# Patient Record
Sex: Female | Born: 1960
Health system: Southern US, Community
[De-identification: ages and names within clinical notes are randomized; demographics above are authoritative.]

## PROBLEM LIST (undated history)

## (undated) DIAGNOSIS — M1990 Unspecified osteoarthritis, unspecified site: Secondary | ICD-10-CM

## (undated) DIAGNOSIS — F329 Major depressive disorder, single episode, unspecified: Secondary | ICD-10-CM

## (undated) DIAGNOSIS — Z973 Presence of spectacles and contact lenses: Secondary | ICD-10-CM

## (undated) DIAGNOSIS — F32A Depression, unspecified: Secondary | ICD-10-CM

## (undated) DIAGNOSIS — E785 Hyperlipidemia, unspecified: Secondary | ICD-10-CM

## (undated) DIAGNOSIS — L80 Vitiligo: Secondary | ICD-10-CM

## (undated) DIAGNOSIS — Z720 Tobacco use: Secondary | ICD-10-CM

## (undated) DIAGNOSIS — J449 Chronic obstructive pulmonary disease, unspecified: Secondary | ICD-10-CM

## (undated) DIAGNOSIS — D219 Benign neoplasm of connective and other soft tissue, unspecified: Secondary | ICD-10-CM

## (undated) DIAGNOSIS — F419 Anxiety disorder, unspecified: Secondary | ICD-10-CM

## (undated) HISTORY — DX: Tobacco use: Z72.0

## (undated) HISTORY — DX: Depression, unspecified: F32.A

## (undated) HISTORY — DX: Benign neoplasm of connective and other soft tissue, unspecified: D21.9

## (undated) HISTORY — DX: Unspecified osteoarthritis, unspecified site: M19.90

## (undated) HISTORY — DX: Vitiligo: L80

## (undated) HISTORY — DX: Chronic obstructive pulmonary disease, unspecified: J44.9

## (undated) HISTORY — PX: TUBAL LIGATION: SHX77

## (undated) HISTORY — PX: DILATION AND CURETTAGE OF UTERUS: SHX78

## (undated) HISTORY — DX: Hyperlipidemia, unspecified: E78.5

## (undated) HISTORY — DX: Major depressive disorder, single episode, unspecified: F32.9

---
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
## 1999-06-24 ENCOUNTER — Other Ambulatory Visit: Admission: RE | Admit: 1999-06-24 | Discharge: 1999-06-24 | Payer: Self-pay | Admitting: Family Medicine

## 1999-12-16 ENCOUNTER — Encounter: Payer: Self-pay | Admitting: Family Medicine

## 1999-12-16 ENCOUNTER — Encounter: Admission: RE | Admit: 1999-12-16 | Discharge: 1999-12-16 | Payer: Self-pay | Admitting: Family Medicine

## 2000-01-05 ENCOUNTER — Encounter: Payer: Self-pay | Admitting: Orthopedic Surgery

## 2000-01-05 ENCOUNTER — Ambulatory Visit (HOSPITAL_COMMUNITY): Admission: RE | Admit: 2000-01-05 | Discharge: 2000-01-05 | Payer: Self-pay | Admitting: Orthopedic Surgery

## 2001-03-25 ENCOUNTER — Other Ambulatory Visit (HOSPITAL_COMMUNITY): Admission: RE | Admit: 2001-03-25 | Discharge: 2001-03-29 | Payer: Self-pay | Admitting: Psychiatry

## 2001-10-12 HISTORY — PX: HIP ARTHROSCOPY: SUR88

## 2002-01-02 ENCOUNTER — Other Ambulatory Visit: Admission: RE | Admit: 2002-01-02 | Discharge: 2002-01-02 | Payer: Self-pay | Admitting: Family Medicine

## 2004-10-24 ENCOUNTER — Encounter: Admission: RE | Admit: 2004-10-24 | Discharge: 2004-10-24 | Payer: Self-pay | Admitting: Family Medicine

## 2004-10-24 ENCOUNTER — Ambulatory Visit: Payer: Self-pay | Admitting: Family Medicine

## 2005-01-22 ENCOUNTER — Ambulatory Visit: Payer: Self-pay | Admitting: Family Medicine

## 2009-10-12 HISTORY — PX: URETER SURGERY: SHX823

## 2009-10-12 HISTORY — PX: ABDOMINAL HYSTERECTOMY: SHX81

## 2009-10-12 HISTORY — PX: LAPAROSCOPIC TOTAL HYSTERECTOMY: SUR800

## 2010-02-06 ENCOUNTER — Ambulatory Visit: Payer: Self-pay | Admitting: Family Medicine

## 2010-02-06 ENCOUNTER — Other Ambulatory Visit: Admission: RE | Admit: 2010-02-06 | Discharge: 2010-02-06 | Payer: Self-pay | Admitting: Family Medicine

## 2010-02-06 DIAGNOSIS — M1990 Unspecified osteoarthritis, unspecified site: Secondary | ICD-10-CM | POA: Insufficient documentation

## 2010-02-06 DIAGNOSIS — F17200 Nicotine dependence, unspecified, uncomplicated: Secondary | ICD-10-CM

## 2010-02-06 DIAGNOSIS — F329 Major depressive disorder, single episode, unspecified: Secondary | ICD-10-CM

## 2010-02-06 DIAGNOSIS — F419 Anxiety disorder, unspecified: Secondary | ICD-10-CM

## 2010-02-06 DIAGNOSIS — Z720 Tobacco use: Secondary | ICD-10-CM | POA: Insufficient documentation

## 2010-02-06 DIAGNOSIS — F32A Depression, unspecified: Secondary | ICD-10-CM | POA: Insufficient documentation

## 2010-02-06 DIAGNOSIS — E785 Hyperlipidemia, unspecified: Secondary | ICD-10-CM | POA: Insufficient documentation

## 2010-02-06 LAB — HM PAP SMEAR

## 2010-02-07 LAB — CONVERTED CEMR LAB
ALT: 11 units/L (ref 0–35)
AST: 15 units/L (ref 0–37)
Albumin: 4.1 g/dL (ref 3.5–5.2)
Alkaline Phosphatase: 52 units/L (ref 39–117)
BUN: 8 mg/dL (ref 6–23)
Bilirubin, Direct: 0.1 mg/dL (ref 0.0–0.3)
CO2: 29 meq/L (ref 19–32)
Calcium: 9.3 mg/dL (ref 8.4–10.5)
Chloride: 107 meq/L (ref 96–112)
Cholesterol: 197 mg/dL (ref 0–200)
Creatinine, Ser: 1.2 mg/dL (ref 0.4–1.2)
GFR calc non Af Amer: 50.82 mL/min (ref >60)
Glucose, Bld: 78 mg/dL (ref 70–99)
HDL: 38.5 mg/dL — ABNORMAL LOW (ref >39.00)
LDL Cholesterol: 137 mg/dL — ABNORMAL HIGH (ref 0–99)
Potassium: 4.1 meq/L (ref 3.5–5.1)
Sodium: 141 meq/L (ref 135–145)
Total Bilirubin: 0.3 mg/dL (ref 0.3–1.2)
Total CHOL/HDL Ratio: 5
Total Protein: 6 g/dL (ref 6.0–8.3)
Triglycerides: 110 mg/dL (ref 0.0–149.0)
VLDL: 22 mg/dL (ref 0.0–40.0)

## 2010-02-11 ENCOUNTER — Encounter (INDEPENDENT_AMBULATORY_CARE_PROVIDER_SITE_OTHER): Payer: Self-pay | Admitting: *Deleted

## 2010-02-11 ENCOUNTER — Encounter: Payer: Self-pay | Admitting: Family Medicine

## 2010-02-11 LAB — CONVERTED CEMR LAB: Pap Smear: NEGATIVE

## 2010-02-13 ENCOUNTER — Encounter: Payer: Self-pay | Admitting: Family Medicine

## 2010-02-14 ENCOUNTER — Encounter: Payer: Self-pay | Admitting: Family Medicine

## 2010-02-17 DIAGNOSIS — R928 Other abnormal and inconclusive findings on diagnostic imaging of breast: Secondary | ICD-10-CM | POA: Insufficient documentation

## 2010-03-24 ENCOUNTER — Emergency Department (HOSPITAL_COMMUNITY): Admission: EM | Admit: 2010-03-24 | Discharge: 2010-03-25 | Payer: Self-pay | Admitting: Emergency Medicine

## 2010-03-27 ENCOUNTER — Ambulatory Visit: Payer: Self-pay | Admitting: Family Medicine

## 2010-03-27 DIAGNOSIS — R109 Unspecified abdominal pain: Secondary | ICD-10-CM | POA: Insufficient documentation

## 2010-03-28 ENCOUNTER — Encounter: Payer: Self-pay | Admitting: Family Medicine

## 2010-03-28 ENCOUNTER — Telehealth: Payer: Self-pay | Admitting: Family Medicine

## 2010-03-28 ENCOUNTER — Ambulatory Visit: Payer: Self-pay | Admitting: Family Medicine

## 2010-04-03 ENCOUNTER — Ambulatory Visit: Payer: Self-pay | Admitting: Family Medicine

## 2010-04-03 ENCOUNTER — Encounter: Payer: Self-pay | Admitting: Family Medicine

## 2010-04-16 ENCOUNTER — Telehealth: Payer: Self-pay | Admitting: Family Medicine

## 2010-05-26 ENCOUNTER — Encounter (INDEPENDENT_AMBULATORY_CARE_PROVIDER_SITE_OTHER): Payer: Self-pay | Admitting: *Deleted

## 2010-05-26 ENCOUNTER — Encounter: Payer: Self-pay | Admitting: Family Medicine

## 2010-05-26 ENCOUNTER — Telehealth: Payer: Self-pay | Admitting: Family Medicine

## 2010-05-26 DIAGNOSIS — D259 Leiomyoma of uterus, unspecified: Secondary | ICD-10-CM | POA: Insufficient documentation

## 2010-05-28 ENCOUNTER — Ambulatory Visit: Payer: Self-pay | Admitting: Obstetrics and Gynecology

## 2010-06-12 ENCOUNTER — Ambulatory Visit (HOSPITAL_COMMUNITY): Admission: RE | Admit: 2010-06-12 | Discharge: 2010-06-12 | Payer: Self-pay | Admitting: Obstetrics and Gynecology

## 2010-06-17 ENCOUNTER — Ambulatory Visit: Payer: Self-pay | Admitting: Family Medicine

## 2010-07-11 ENCOUNTER — Ambulatory Visit: Payer: Self-pay | Admitting: Internal Medicine

## 2010-07-11 DIAGNOSIS — R932 Abnormal findings on diagnostic imaging of liver and biliary tract: Secondary | ICD-10-CM | POA: Insufficient documentation

## 2010-07-22 ENCOUNTER — Encounter: Payer: Self-pay | Admitting: Family Medicine

## 2010-07-24 ENCOUNTER — Ambulatory Visit: Payer: Self-pay | Admitting: Family Medicine

## 2010-07-24 ENCOUNTER — Encounter: Payer: Self-pay | Admitting: Urology

## 2010-07-24 ENCOUNTER — Ambulatory Visit (HOSPITAL_COMMUNITY): Admission: RE | Admit: 2010-07-24 | Discharge: 2010-07-26 | Payer: Self-pay | Admitting: Family Medicine

## 2010-07-28 ENCOUNTER — Ambulatory Visit (HOSPITAL_COMMUNITY): Admission: RE | Admit: 2010-07-28 | Discharge: 2010-07-28 | Payer: Self-pay | Admitting: Urology

## 2010-08-18 ENCOUNTER — Encounter: Payer: Self-pay | Admitting: Family Medicine

## 2010-08-19 ENCOUNTER — Ambulatory Visit: Payer: Self-pay | Admitting: Family Medicine

## 2010-09-01 ENCOUNTER — Ambulatory Visit (HOSPITAL_COMMUNITY)
Admission: RE | Admit: 2010-09-01 | Discharge: 2010-09-01 | Payer: Self-pay | Source: Home / Self Care | Admitting: Urology

## 2010-09-02 ENCOUNTER — Ambulatory Visit: Payer: Self-pay | Admitting: Family Medicine

## 2010-09-11 ENCOUNTER — Inpatient Hospital Stay (HOSPITAL_COMMUNITY)
Admission: RE | Admit: 2010-09-11 | Discharge: 2010-09-15 | Payer: Self-pay | Source: Home / Self Care | Admitting: Urology

## 2010-09-11 ENCOUNTER — Encounter (INDEPENDENT_AMBULATORY_CARE_PROVIDER_SITE_OTHER): Payer: Self-pay | Admitting: Urology

## 2010-09-17 ENCOUNTER — Ambulatory Visit: Payer: Self-pay | Admitting: Family Medicine

## 2010-10-09 ENCOUNTER — Telehealth: Payer: Self-pay | Admitting: Family Medicine

## 2010-10-14 ENCOUNTER — Ambulatory Visit
Admission: RE | Admit: 2010-10-14 | Discharge: 2010-10-14 | Payer: Self-pay | Source: Home / Self Care | Attending: Family Medicine | Admitting: Family Medicine

## 2010-10-16 ENCOUNTER — Ambulatory Visit
Admission: RE | Admit: 2010-10-16 | Discharge: 2010-10-16 | Payer: Self-pay | Source: Home / Self Care | Attending: Urology | Admitting: Urology

## 2010-10-16 LAB — POCT HEMOGLOBIN-HEMACUE: Hemoglobin: 13.1 g/dL (ref 12.0–15.0)

## 2010-10-24 ENCOUNTER — Encounter: Payer: Self-pay | Admitting: Family Medicine

## 2010-11-05 ENCOUNTER — Ambulatory Visit
Admission: RE | Admit: 2010-11-05 | Discharge: 2010-11-05 | Payer: Self-pay | Source: Home / Self Care | Attending: Family Medicine | Admitting: Family Medicine

## 2010-11-05 ENCOUNTER — Encounter: Payer: Self-pay | Admitting: Family Medicine

## 2010-11-06 NOTE — Assessment & Plan Note (Signed)
NAME:  [name] [name]              ACCOUNT NO.:  [id number]  MEDICAL RECORD NO.:  [id number]          PATIENT TYPE:  POB  LOCATION:  [facility] at [facility]         FACILITY:  [facility]  PHYSICIAN:  Tinnie Gens, MD        DATE OF BIRTH:  1961-09-26  DATE OF SERVICE:                                 CLINIC NOTE  CHIEF COMPLAINT:  Followup.  HISTORY OF PRESENT ILLNESS:  The patient is a 50 year old patient who underwent TLH with subsequent ureteral injury.  She has undergone ureteral repair and reimplantation.  Last time she was here she still had a double-J stent and this has since been removed as has her percutaneous nephrostomy tube.  Since removal, she has had increasing pain in her back.  She is using a Lidoderm patch, Ultram and Percocet if needed. She reports she has been off of that for approximately 2 days and that her pain has gotten better.  She has also been back to work for a couple hours and has done okay there and she is going to gradually increase her hours.  She is to follow up to Urology today.  We did discuss hormone replacement therapy.  She continues to have hot flashes and probably is in need of a higher dose of hormone replacement.  We discussed how often to change the patch and how much would be in it.  On exam today, vitals are as noted in the chart.  She is well-developed, well-nourished female in no acute distress.  Her abdomen is soft, nontender, nondistended.  Incisions is well healed.  IMPRESSION: 1. Postop visit. 2. Not quite enough hormone replacement therapy.  PLAN:  To increase her Vivelle-Dot to 0.075 mg per day.  She will change the dot twice weekly.  She will follow with Urology and return to see me in 4-6 weeks for final postop check.          ______________________________ Tinnie Gens, MD    TP/MEDQ  D:  11/05/2010  T:  11/06/2010  Job:  [id number]

## 2010-11-11 NOTE — Progress Notes (Signed)
Summary: Rx Tramadol  Phone Note Call from Patient   Caller: Patient Summary of Call: Called patient to give referral appts for GYN and GI. She asked me to ask Dr Dayton Martes for some pain medicine . She says her pain is about a 7 out of 10, she says she is not good with pain. It will just hit her randomly and never the same location. she uses [facility].  Her appt is 05/28/2010 with GYN - Dr Jolayne Panther and GI is 07/11/2010 with Dr Leone Payor.  Please call the patient with what you can call in. Codeine makes her nauceous.  Initial call taken by: Carlton Adam,  May 26, 2010 4:43 PM  Follow-up for Phone Call        Left message on machine at home for patient to return call.  Linde Gillis CMA Duncan Dull)  May 27, 2010 10:04 AM   Patient advised as instructed via telephone. Follow-up by: Linde Gillis CMA Duncan Dull),  May 27, 2010 1:19 PM    New/Updated Medications: TRAMADOL HCL 50 MG  TABS (TRAMADOL HCL) 1-2 tab by mouth two times a day as needed pain Prescriptions: TRAMADOL HCL 50 MG  TABS (TRAMADOL HCL) 1-2 tab by mouth two times a day as needed pain  #60 x 0   Entered and Authorized by:   Ruthe Mannan MD   Signed by:   Ruthe Mannan MD on 05/27/2010   Method used:   [facility]  [address] (retail)       [address]       [place], [place]  [address]       Ph: [phone] or [phone]       Fax: [phone]   RxID:   [id number]

## 2010-11-11 NOTE — Miscellaneous (Signed)
Summary: Orders Update  Clinical Lists Changes  Problems: Added new problem of LEIOMYOMA, UTERUS (ICD-218.9) Orders: Added new Referral order of Gynecologic Referral (Gyn) - Signed Added new Referral order of Gastroenterology Referral (GI) - Signed

## 2010-11-11 NOTE — Letter (Signed)
Summary: Results Follow up Letter  [place] at [facility]  [address] [place], [place] [address]   Phone: [phone]  Fax: [phone]    02/11/2010 MRN: [id number]    [name] [name] [address] [place], [place]  [address]    Dear Ms. Salzwedel,  The following are the results of your recent test(s):  Test         Result    Pap Smear:        Normal __X___  Not Normal _____ Comments:  Yearly follow up is recommended. ______________________________________________________ Cholesterol: LDL(Bad cholesterol):         Your goal is less than:         HDL (Good cholesterol):       Your goal is more than: Comments:  ______________________________________________________ Mammogram:        Normal _____  Not Normal _____ Comments:  ___________________________________________________________________ Hemoccult:        Normal _____  Not normal _______ Comments:    _____________________________________________________________________ Other Tests:    We routinely do not discuss normal results over the telephone.  If you desire a copy of the results, or you have any questions about this information we can discuss them at your next office visit.   Sincerely,    Ruthe Mannan,  M.D.  TA:lsf

## 2010-11-11 NOTE — Progress Notes (Signed)
Summary: abd ultrasound negative  Phone Note From Other Clinic   Caller: [facility] [place] Summary of Call: Called report regarding pt's abd ultrasound, this was negative. Initial call taken by: Lowella Petties CMA,  March 28, 2010 8:59 AM  Follow-up for Phone Call        thank you, please advise pt it was neg. Ruthe Mannan MD  March 28, 2010 9:05 AM   Additional Follow-up for Phone Call Additional follow up Details #1::        Left message for patient to call back. Lewanda Rife LPN  March 28, 2010 10:19 AM  When she calls back, please let her know that I would like to order a CT of her abdomen to evaluate further.  If that is ok with her, I will send order to [facility]. Ruthe Mannan MD  March 28, 2010 10:48 AM     Additional Follow-up for Phone Call Additional follow up Details #2::    Patient notified as instructed by telephone. Pt is agreeable to have CT of abdomen. Pt would like to go to [facility] and her work schedule is very busy. Pt could go 03/31/10 after 1PM and pt could go 04/03/10 anytime that day. Pt can be reached on cell at [phone]. Pt will wait to hear from [facility].Lewanda Rife LPN  March 28, 2010 11:00 AM

## 2010-11-11 NOTE — Letter (Signed)
Summary: New Patient letter  [facility]  [address] [place], [place] [address]   Phone: [phone]  Fax: [phone]       05/26/2010 MRN: [id number]  [name] [name] [address] [place], [place]  [address]  Dear [name] [name],  Welcome to the Gastroenterology Division at [facility].    You are scheduled to see Dr.  Leone Payor on 07-11-10 at 10:00a.m. on the 3rd floor at [facility], [address]  We ask that you try to arrive at our office 15 minutes prior to your appointment time to allow for check-in.  We would like you to complete the enclosed self-administered evaluation form prior to your visit and bring it with you on the day of your appointment.  We will review it with you.  Also, please bring a complete list of all your medications or, if you prefer, bring the medication bottles and we will list them.  Please bring your insurance card so that we may make a copy of it.  If your insurance requires a referral to see a specialist, please bring your referral form from your primary care physician.  Co-payments are due at the time of your visit and may be paid by cash, check or credit card.     Your office visit will consist of a consult with your physician (includes a physical exam), any laboratory testing he/she may order, scheduling of any necessary diagnostic testing (e.g. x-ray, ultrasound, CT-scan), and scheduling of a procedure (e.g. Endoscopy, Colonoscopy) if required.  Please allow enough time on your schedule to allow for any/all of these possibilities.    If you cannot keep your appointment, please call [phone] to cancel or reschedule prior to your appointment date.  This allows [place] the opportunity to schedule an appointment for another patient in need of care.  If you do not cancel or reschedule by 5 p.m. the business day prior to your appointment date, you will be charged a $50.00 late cancellation/no-show fee.    Thank you for  choosing [facility] for your medical needs.  We appreciate the opportunity to care for you.  Please visit [place] at our website  to learn more about our practice.                     Sincerely,                                                             The Gastroenterology Division

## 2010-11-11 NOTE — Assessment & Plan Note (Signed)
Summary: NEW PT TO ESTABH/DLO   Vital Signs:  Patient profile:   50 year old female Height:      59 inches Weight:      116 pounds BMI:     23.51 Temp:     98.8 degrees F oral Pulse rate:   96 / minute Pulse rhythm:   regular BP sitting:   106 / 74  (left arm) Cuff size:   regular  Vitals Entered By: Delilah Shan CMA Duncan Dull) (February 06, 2010 2:04 PM) CC: New patient to Establish   History of Present Illness: 50 yo female here to establish care.  Depression- ongoing issue for years.  No energy, tearfulness, poor concentration.  No changes in appetite or difficulty sleeping.  No SI or HI.  Was on Wellbutrin for 6 months in past and it helped tremendously.  Tobacco abuse- 1 1/2 ppd x 38 years.  Quit one time, while on Wellbutrin for 6 months.  As soon as she ran out of Wellbutrin, started smoking again.  H/o HLD- previously on statin, has not had it checked in years.  Well woman- has not had a pap smear or mammogram in 8 years.  Never had an abnormal pap or mammogram that she is aware of.  Sexually active with husband only.  LMP 01/16/2010.  Current Medications (verified): 1)  Zyban 150 Mg Xr12h-Tab (Bupropion Hcl (Smoking Deter)) .Marland KitchenMarland KitchenMarland Kitchen 150 Mg Once Daily For 3 Days; Increase To 150 Mg Twice Daily  Allergies (verified): 1)  ! Codeine  Past History:  Family History: Last updated: 02/06/2010 She does not know  Past Medical History: Depression Hyperlipidemia Tobacco abuse Osteoarthritis  Past Surgical History: Labrum repair   Family History: She does not know  Social History: LIves with husband in [place]. Smokes 1 1/2 ppd x 38 years. 3 children. 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  Review of Systems      See HPI General:  Denies chills, fatigue, fever, and loss of appetite. Eyes:  Denies blurring. ENT:  Denies difficulty swallowing. CV:  Denies chest pain or discomfort, difficulty breathing at night, difficulty breathing while  lying down, fainting, fatigue, and leg cramps with exertion. Resp:  Denies shortness of breath. GI:  Denies abdominal pain, bloody stools, and change in bowel habits. GU:  Denies discharge, dysuria, and incontinence. MS:  Denies joint pain, joint redness, and joint swelling. Derm:  Denies rash. Neuro:  Denies visual disturbances and weakness. Psych:  Complains of anxiety, depression, and easily tearful; denies panic attacks, sense of great danger, suicidal thoughts/plans, thoughts of violence, unusual visions or sounds, and thoughts /plans of harming others. Endo:  Denies cold intolerance and heat intolerance. Heme:  Denies abnormal bruising and bleeding.  Physical Exam  General:  alert, well-developed, and well-nourished, anxious, appears older than stated age, NAD.   Eyes:  vision grossly intact, pupils equal, pupils round, and pupils reactive to light.   Ears:  R ear normal and L ear normal.   Nose:  no external deformity.   Mouth:  Oral mucosa and oropharynx without lesions or exudates.  Teeth in good repair. Breasts:  No mass, nodules, thickening, tenderness, bulging, retraction, inflamation, nipple discharge or skin changes noted.   Lungs:  Normal respiratory effort, chest expands symmetrically. Lungs are clear to auscultation, no crackles or wheezes. Heart:  normal rate, regular rhythm, and no murmur.   Abdomen:  Bowel sounds positive,abdomen soft and non-tender without masses, organomegaly or hernias noted. Genitalia:  Pelvic Exam:  External: normal female genitalia without lesions or masses        Vagina: normal without lesions or masses        Cervix: normal without lesions or masses        Adnexa: normal bimanual exam without masses or fullness        Uterus: normal by palpation        Pap smear: performed Extremities:  no edema Neurologic:  alert & oriented X3.   Psych:  Oriented X3, memory intact for recent and remote, normally interactive, and good eye contact,  anxious appearing.     Impression & Recommendations:  Problem # 1:  PREVENTIVE HEALTH CARE (ICD-V70.0) Reviewed preventive care protocols, scheduled due services, and updated immunizations Discussed nutrition, exercise, diet, and healthy lifestyle.  FLP, BMET today. Pap today, tetanus today. Set up mammogram. Orders: Venipuncture (40981) TLB-BMP (Basic Metabolic Panel-BMET) (80048-METABOL)  Problem # 2:  HYPERLIPIDEMIA (ICD-272.4) Assessment: Unchanged recheck FLP and hepatic panel before starting meds.  Problem # 3:  DEPRESSION (ICD-311) Assessment: Deteriorated spent more than 35 minutes face to face discussing depression and tobacco abuse. Will try Wellubutrin/Zyban as it helped with both her depression and tobacco abuse in past. Follow up in one month.  Problem # 4:  TOBACCO ABUSE (ICD-305.1) see above. Her updated medication list for this problem includes:    Zyban 150 Mg Xr12h-tab (Bupropion hcl (smoking deter)) .Marland KitchenMarland KitchenMarland KitchenMarland Kitchen 150 mg once daily for 3 days; increase to 150 mg twice daily  Complete Medication List: 1)  Zyban 150 Mg Xr12h-tab (Bupropion hcl (smoking deter)) .Marland KitchenMarland KitchenMarland Kitchen 150 mg once daily for 3 days; increase to 150 mg twice daily  Other Orders: TLB-Lipid Panel (80061-LIPID) TLB-Hepatic/Liver Function Pnl (80076-HEPATIC) Pap Smear, Thin Prep ( Collection of) (X9147) Radiology Referral (Radiology)  Patient Instructions: 1)  Please stop by to see Aram Beecham on the way out. Prescriptions: ZYBAN 150 MG XR12H-TAB (BUPROPION HCL (SMOKING DETER)) 150 mg once daily for 3 days; increase to 150 mg twice daily  #90 x 3   Entered and Authorized by:   Ruthe Mannan MD   Signed by:   Ruthe Mannan MD on 02/06/2010   Method used:   Print then Give to Patient   RxID:   [id number]   Prior Medications (reviewed today): None Current Allergies (reviewed today): ! CODEINE   Past Medical History:    Depression    Hyperlipidemia    Tobacco abuse    Osteoarthritis  Past Surgical  History:    Labrum repair    TD Result Date:  02/06/2010 TD Result:  given TD Next Due:  10 yr   Appended Document: NEW PT TO ESTABH/DLO   Immunizations Administered:  Tetanus Vaccine:    Vaccine Type: Tdap    Site: left deltoid    Mfr: GlaxoSmithKline    Dose: 0.5 ml    Route: IM    Given by: Delilah Shan CMA (AAMA)    Exp. Date: 01/04/2012    Lot #: [id number]    VIS given: 08/30/07 version given February 06, 2010.

## 2010-11-11 NOTE — Assessment & Plan Note (Signed)
Summary: abd pain--ch.   History of Present Illness Visit Type: Initial Consult Primary GI MD: Stan Head MD [facility] Primary Provider: Ruthe Mannan, MD Requesting Provider: Ruthe Mannan, MD Chief Complaint: abdominal pain  History of Present Illness:   50 yo ww had severe abdominal pain (cannot remember where) and went to ED. "I did not think I was going through the night". She had been having abdominal pain since March or so. pain is not consistently located in one area.   She is scheduled to have a hysterectomy and oopherctomy in about two weeks due to fibroids.  She had an [place] and then CT of abdomen due to fullness in pancreas and there is an 8 mm CBD. Gallbladder ok.  She drank regularly and heavily for 20+ years. She stopped about 10 years ago.   she had not been seeing physicians for about 8 yearsm, starting 2011 after husband obtained health insurance through a job.   ED 6/13 - reviewed   GI Review of Systems    Reports abdominal pain and  nausea.     Location of  Abdominal pain: generalized.    Denies acid reflux, belching, bloating, chest pain, dysphagia with liquids, dysphagia with solids, heartburn, loss of appetite, vomiting, vomiting blood, weight loss, and  weight gain.        Denies anal fissure, black tarry stools, change in bowel habit, constipation, diarrhea, diverticulosis, fecal incontinence, heme positive stool, hemorrhoids, irritable bowel syndrome, jaundice, light color stool, liver problems, rectal bleeding, and  rectal pain. Preventive Screening-Counseling & Management  Alcohol-Tobacco     Alcohol drinks/day: 0     Alcohol Counseling: not indicated; patient does not drink     Smoking Status: current     Smoking Cessation Counseling: yes     Smoke Cessation Stage: contemplative     Passive Smoke Exposure: yes     Tobacco Counseling: not indicated; no tobacco use     Passive Smoke Counseling: to avoid passive smoke exposure  Caffeine-Diet-Exercise  Caffeine use/day: 6-8     Caffeine Counseling: decrease use of caffeine     Does Patient Exercise: no     Exercise Counseling: to improve exercise regimen      Drug Use:  yes.      [place] of Abdomen  Procedure date:  03/28/2010  Findings:      Mild fullness of body of pancreas Otherwise normal  [facility]  CT Abdomen/Pelvis  Procedure date:  04/03/2010  Findings:      1) Leiomyomatous uterus with soft tissue prominence left adnexa 2) Fullness in pancreas, CBD 'distended' in p[ancreas 3) probable renal cysts  [facility]   Current Medications (verified): 1)  Zyban 150 Mg Xr12h-Tab (Bupropion Hcl (Smoking Deter)) .Marland KitchenMarland KitchenMarland Kitchen 150 Mg Once Daily For 3 Days; Increase To 150 Mg Twice Daily 2)  Hydrocodone-Acetaminophen 5-500 Mg Tabs (Hydrocodone-Acetaminophen) .... Take 1-2 Tabs By Mouth Every 4-6 Hours As Needed Pain 3)  Lorazepam 0.5 Mg Tabs (Lorazepam) .Marland Kitchen.. 1 Tab By Mouth Three Times A Day As Needed Anxiety 4)  Tramadol Hcl 50 Mg  Tabs (Tramadol Hcl) .Marland Kitchen.. 1-2 Tab By Mouth Two Times A Day As Needed Pain  Allergies (verified): 1)  ! Codeine  Past History:  Past Medical History: Depression Hyperlipidemia Tobacco abuse Osteoarthritis Anxiety Disorder COPD Uterine fibroids Vitilligo since age 18  Past Surgical History: Labrum repair hip  Family History: Reviewed history from 02/06/2010 and no changes required. She does not know  Social History: Reviewed history from 02/06/2010 and  no changes required. LIves with husband in [place]. Smokes 1 1/2 ppd x 38 years. 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          All other ROS negative except as per HPI.   Vital Signs:  Patient profile:   50 year old female Height:      59 inches Weight:      111 pounds BMI:     22.50 Pulse rate:   80 / minute Pulse rhythm:   regular BP sitting:   112 / 66  (left arm)  Vitals Entered By: Milford Cage NCMA (July 11, 2010 9:46 AM)  Physical Exam  General:  alert, well-developed, and well-nourished, anxious, appears older than stated age, NAD.   Eyes:  PERRLA, no icterus. Mouth:  Oral mucosa and oropharynx without lesions or exudates. partial denture, other teeth in good repair Lungs:  Clear throughout to auscultation. Heart:  Regular rate and rhythm; no murmurs, rubs,  or bruits. Abdomen:  Soft and nondistended. No masses, hepatosplenomegaly or hernias noted. Normal bowel sounds. tender in Bilateral LQ's pelvic area  Extremities:  no edema Cervical Nodes:  No significant cervical or supraclavicular adenopathy.    Impression & Recommendations:  Problem # 1:  NONSPECIFIC ABN FINDNG RAD&OTH EXAM BILARY TRCT (ICD-793.3) Assessment New Fullness in pancreas and distended CBD on CT with NL CBD on earlier [place] nothing in hx points to problems in pancreas now, she did drink heavily in past and maybe that is the cause of these subtle changes She needs to have her hysterectomy and I do not think there is a need to follow- this up now. Will place an ofice visit recall for Decmber 2011 and we can consider repeat imaging then but I am not certain it is needed.  Problem # 2:  ABDOMINAL PAIN OTHER SPECIFIED SITE (ICD-789.09) Assessment: Unchanged I believe pain is from leiomyomata of uterus  Problem # 3:  LEIOMYOMA, UTERUS (ICD-218.9) Assessment: Unchanged I think this is her main problem at this time. She should proceed with hysterectonmy as planned and would not do GI work-up at this time.  Problem # 4:  SCREENING, COLON CANCER (ICD-V76.51) Assessment: Comment Only due for a colonoscopy at age 46,  06/2011  Patient Instructions: 1)  You will be due for a COLONOSCOPY when you are 50--06/2011. 2)  Please schedule a follow-up appointment in 2 months.  3)  Copy sent to : Ruthe Mannan, MD; Tinnie Gens, MD 4)  The medication list was reviewed and reconciled.  All changed / newly prescribed medications were explained.  A complete medication list was provided to the patient / caregiver.

## 2010-11-11 NOTE — Assessment & Plan Note (Signed)
Summary: 30 MIN F/U [place] ON 03/24/10/[facility]   Vital Signs:  Patient profile:   50 year old female Height:      59 inches Weight:      117.38 pounds BMI:     23.79 Temp:     98.7 degrees F oral Pulse rate:   84 / minute Pulse rhythm:   regular BP sitting:   98 / 68  (left arm) Cuff size:   regular  Vitals Entered By: Linde Gillis CMA Duncan Dull) (March 27, 2010 3:42 PM) CC: 30 minute exam, [place] follow up   History of Present Illness: 50 yo here for ER follow up.  Seen on 6/14 for diffuse abdominal pain, often concentrated to RUQ x 2 weeks. Sometimes worsened by food and associated with nausea, no vomiting.  ER records reviewed. UA, CBC, CMET , lipase, upreg all neg. Given pepsid 20 mg daily, Zofran 8 mg two times a day as needed, and 12 percocet.  Says she is not feeling any better. No CP, SOB, LE or other symptoms.  Current Medications (verified): 1)  Zyban 150 Mg Xr12h-Tab (Bupropion Hcl (Smoking Deter)) .Marland KitchenMarland KitchenMarland Kitchen 150 Mg Once Daily For 3 Days; Increase To 150 Mg Twice Daily 2)  Famotidine 20 Mg Tabs (Famotidine) .... Take One Tablet By Mouth Daily 3)  Ondansetron Hcl 4 Mg Tabs (Ondansetron Hcl) .... Take One Tablet By Mouth Two Times A Day 4)  Hydrocodone-Acetaminophen 5-325 Mg Tabs (Hydrocodone-Acetaminophen) .... Take One To Two Tablet By Mouth Every Four To Six Hours As Needed For Pain  Allergies: 1)  ! Codeine  Past History:  Past Medical History: Last updated: 02/06/2010 Depression Hyperlipidemia Tobacco abuse Osteoarthritis  Past Surgical History: Last updated: 02/06/2010 Labrum repair   Family History: Last updated: 02/06/2010 She does not know  Social History: Last updated: 02/06/2010 LIves with husband in [place]. Smokes 1 1/2 ppd x 38 years. 3 children. 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  Review of Systems      See HPI General:  Denies fever. Resp:  Denies cough. GI:  Complains of abdominal pain and nausea;  denies change in bowel habits and vomiting.  Physical Exam  General:  alert, well-developed, and well-nourished, anxious, appears older than stated age, NAD.   Abdomen:  Bowel sounds positive,abdomen soft, diffusely tender, no rebound, no guarding. neg murphy's Psych:  Oriented X3, memory intact for recent and remote, normally interactive, and good eye contact, anxious appearing.     Impression & Recommendations:  Problem # 1:  ABDOMINAL PAIN OTHER SPECIFIED SITE (ICD-789.09) Assessment New Etiology unknown, will get abdominal ultrasound to rule out gall bladder pathology. Of note, pt asked for more pain medication.  I did not give her any as abdominal pain without a source is not helped by narcotics and in fact may further worsen the process, ie constipation.  Pt not pleased but left without much difficulty. Given samples of Nexium. Her updated medication list for this problem includes:    Hydrocodone-acetaminophen 5-325 Mg Tabs (Hydrocodone-acetaminophen) .Marland Kitchen... Take one to two tablet by mouth every four to six hours as needed for pain  Orders: Radiology Referral (Radiology)  Complete Medication List: 1)  Zyban 150 Mg Xr12h-tab (Bupropion hcl (smoking deter)) .Marland KitchenMarland KitchenMarland Kitchen 150 mg once daily for 3 days; increase to 150 mg twice daily 2)  Famotidine 20 Mg Tabs (Famotidine) .... Take one tablet by mouth daily 3)  Ondansetron Hcl 4 Mg Tabs (Ondansetron hcl) .... Take one tablet by mouth two times  a day 4)  Hydrocodone-acetaminophen 5-325 Mg Tabs (Hydrocodone-acetaminophen) .... Take one to two tablet by mouth every four to six hours as needed for pain  Patient Instructions: 1)  Please stop  by to see Shirlee Limerick on your way out to set up your ultrasound.  Current Allergies (reviewed today): ! CODEINE

## 2010-11-11 NOTE — Progress Notes (Signed)
Summary: Wants test results  Phone Note Call from Patient Call back at Home Phone [phone]   Caller: Patient Call For: Ruthe Mannan MD Summary of Call: Patient returned your call and requested that you call her at her home number Thursday after 1:00 Pm, because she will be at work in the morning. Patient is aware that Dr. Dayton Martes is out this afternoon. Initial call taken by: Sydell Axon LPN,  April 17, 5783 1:55 PM  Follow-up for Phone Call        Returned her call at home.  Discussed results.  See CT scan addendum. Ruthe Mannan MD  April 18, 2010 1:44 PM

## 2010-11-13 NOTE — Progress Notes (Signed)
Summary: refill request for zyban  Phone Note Refill Request Message from:  Fax from Pharmacy  Refills Requested: Medication #1:  ZYBAN 150 MG XR12H-TAB 150 mg once daily for 3 days; increase to 150 mg twice daily   Last Refilled: 09/08/2010 [facility] [address]  Initial call taken by: Lowella Petties CMA, AAMA,  October 09, 2010 10:06 AM    Prescriptions: ZYBAN 150 MG XR12H-TAB (BUPROPION HCL (SMOKING DETER)) 150 mg once daily for 3 days; increase to 150 mg twice daily  #90 x 3   Entered and Authorized by:   Ruthe Mannan MD   Signed by:   Ruthe Mannan MD on 10/09/2010   Method used:   [facility]        [facility]  [address] (retail[facility]       [address]       [place], [place]  [address]       Ph: [phone] or [phone]       Fax: [phone]   RxID:   [id number]

## 2010-11-13 NOTE — Letter (Signed)
Summary: [facility]   Imported By: Maryln Gottron 11/03/2010 11:08:45  _____________________________________________________________________  External Attachment:    Type:   Image     Comment:   External Document

## 2010-11-19 NOTE — Letter (Signed)
Summary: [facility]  [facility]   Imported By: Kassie Mends 11/14/2010 11:22:36  _____________________________________________________________________  External Attachment:    Type:   Image     Comment:   External Document

## 2010-12-08 NOTE — Op Note (Signed)
  NAME:  Billups, Emryn              ACCOUNT NO.:  [id number]  MEDICAL RECORD NO.:  [id number]          PATIENT TYPE:  POB  LOCATION:  [facility]                          FACILITY:  [facility]  PHYSICIAN:  Genese Quebedeaux I. Gerik Coberly, M.D.DATE OF BIRTH:  [birth year]  DATE OF PROCEDURE:  10/16/2010 DATE OF DISCHARGE:  10/14/2010                              OPERATIVE REPORT   ADDENDUM:  With the patient in the dorsal lithotomy position, red Robinson catheter was passed, and cystogram was accomplished, which showed no evidence of extravasation, and no reflux.  The bladder walls were smooth.  The red Robinson catheter was removed, and cystourethroscopy was accomplished, which showed normal right hemitrigone, and the left-sided ureteral reimplantation, nicely fixed on the left lateral wall.  The double-J catheter was grasped with a biopsy forceps, and removed easily.  There was no stone material buildup on the double-J catheter.  The ureteral reimplantation was thought to be well-healed.  The bladder was then drained of fluid, the patient given IV Toradol, and awakened and taken recovery room in good condition.     Tristin Gladman I. Patsi Sears, M.D.     SIT/MEDQ  D:  11/04/2010  T:  11/04/2010  Job:  [id number]  Electronically Signed by Jethro Bolus M.D. on 12/08/2010 09:32:31 AM

## 2010-12-10 ENCOUNTER — Ambulatory Visit: Payer: Commercial Indemnity | Admitting: Obstetrics & Gynecology

## 2010-12-10 DIAGNOSIS — Z09 Encounter for follow-up examination after completed treatment for conditions other than malignant neoplasm: Secondary | ICD-10-CM

## 2010-12-23 LAB — BASIC METABOLIC PANEL
BUN: 6 mg/dL (ref 6–23)
BUN: 6 mg/dL (ref 6–23)
BUN: 7 mg/dL (ref 6–23)
BUN: 8 mg/dL (ref 6–23)
BUN: 9 mg/dL (ref 6–23)
BUN: 8 mg/dL (ref 6–23)
CO2: 26 mEq/L (ref 19–32)
CO2: 27 mEq/L (ref 19–32)
CO2: 27 mEq/L (ref 19–32)
CO2: 28 mEq/L (ref 19–32)
CO2: 31 mEq/L (ref 19–32)
Calcium: 8.5 mg/dL (ref 8.4–10.5)
Calcium: 8.7 mg/dL (ref 8.4–10.5)
Calcium: 8.8 mg/dL (ref 8.4–10.5)
Calcium: 8.8 mg/dL (ref 8.4–10.5)
Calcium: 9.1 mg/dL (ref 8.4–10.5)
Calcium: 9.5 mg/dL (ref 8.4–10.5)
Chloride: 103 mEq/L (ref 96–112)
Chloride: 104 mEq/L (ref 96–112)
Chloride: 104 mEq/L (ref 96–112)
Chloride: 107 mEq/L (ref 96–112)
Chloride: 108 mEq/L (ref 96–112)
Chloride: 104 mEq/L (ref 96–112)
Creatinine, Ser: 0.79 mg/dL (ref 0.4–1.2)
Creatinine, Ser: 0.79 mg/dL (ref 0.4–1.2)
Creatinine, Ser: 0.81 mg/dL (ref 0.4–1.2)
Creatinine, Ser: 0.83 mg/dL (ref 0.4–1.2)
Creatinine, Ser: 0.96 mg/dL (ref 0.4–1.2)
Creatinine, Ser: 0.95 mg/dL (ref 0.4–1.2)
GFR calc Af Amer: >60 mL/min (ref >60)
GFR calc Af Amer: >60 mL/min (ref >60)
GFR calc Af Amer: >60 mL/min (ref >60)
GFR calc Af Amer: >60 mL/min (ref >60)
GFR calc Af Amer: >60 mL/min (ref >60)
GFR calc Af Amer: >60 mL/min (ref >60)
GFR calc non Af Amer: >60 mL/min (ref >60)
GFR calc non Af Amer: >60 mL/min (ref >60)
GFR calc non Af Amer: >60 mL/min (ref >60)
GFR calc non Af Amer: >60 mL/min (ref >60)
GFR calc non Af Amer: >60 mL/min (ref >60)
GFR calc non Af Amer: >60 mL/min (ref >60)
Glucose, Bld: 102 mg/dL — ABNORMAL HIGH (ref 70–99)
Glucose, Bld: 117 mg/dL — ABNORMAL HIGH (ref 70–99)
Glucose, Bld: 197 mg/dL — ABNORMAL HIGH (ref 70–99)
Glucose, Bld: 93 mg/dL (ref 70–99)
Glucose, Bld: 94 mg/dL (ref 70–99)
Potassium: 4.2 mEq/L (ref 3.5–5.1)
Potassium: 4.2 mEq/L (ref 3.5–5.1)
Potassium: 4.2 mEq/L (ref 3.5–5.1)
Potassium: 4.3 mEq/L (ref 3.5–5.1)
Potassium: 4.5 mEq/L (ref 3.5–5.1)
Sodium: 137 mEq/L (ref 135–145)
Sodium: 138 mEq/L (ref 135–145)
Sodium: 139 mEq/L (ref 135–145)
Sodium: 140 mEq/L (ref 135–145)
Sodium: 142 mEq/L (ref 135–145)

## 2010-12-23 LAB — CBC
HCT: 31.8 % — ABNORMAL LOW (ref 36.0–46.0)
HCT: 32.3 % — ABNORMAL LOW (ref 36.0–46.0)
HCT: 33.4 % — ABNORMAL LOW (ref 36.0–46.0)
HCT: 34.1 % — ABNORMAL LOW (ref 36.0–46.0)
HCT: 38.4 % (ref 36.0–46.0)
Hemoglobin: 10.7 g/dL — ABNORMAL LOW (ref 12.0–15.0)
Hemoglobin: 10.7 g/dL — ABNORMAL LOW (ref 12.0–15.0)
Hemoglobin: 11.1 g/dL — ABNORMAL LOW (ref 12.0–15.0)
Hemoglobin: 11.3 g/dL — ABNORMAL LOW (ref 12.0–15.0)
Hemoglobin: 12.8 g/dL (ref 12.0–15.0)
MCH: 29.2 pg (ref 26.0–34.0)
MCH: 29.4 pg (ref 26.0–34.0)
MCH: 29.5 pg (ref 26.0–34.0)
MCH: 29.6 pg (ref 26.0–34.0)
MCH: 29.8 pg (ref 26.0–34.0)
MCHC: 33.1 g/dL (ref 30.0–36.0)
MCHC: 33.1 g/dL (ref 30.0–36.0)
MCHC: 33.2 g/dL (ref 30.0–36.0)
MCHC: 33.3 g/dL (ref 30.0–36.0)
MCHC: 33.6 g/dL (ref 30.0–36.0)
MCV: 87.9 fL (ref 78.0–100.0)
MCV: 88.1 fL (ref 78.0–100.0)
MCV: 88.6 fL (ref 78.0–100.0)
MCV: 89 fL (ref 78.0–100.0)
MCV: 89.3 fL (ref 78.0–100.0)
MCV: 88.3 fL (ref 78.0–100.0)
Platelets: 145 10*3/uL — ABNORMAL LOW (ref 150–400)
Platelets: 155 10*3/uL (ref 150–400)
Platelets: 156 10*3/uL (ref 150–400)
Platelets: 159 10*3/uL (ref 150–400)
Platelets: 180 10*3/uL (ref 150–400)
Platelets: 166 10*3/uL (ref 150–400)
RBC: 3.59 MIL/uL — ABNORMAL LOW (ref 3.87–5.11)
RBC: 3.63 MIL/uL — ABNORMAL LOW (ref 3.87–5.11)
RBC: 3.8 MIL/uL — ABNORMAL LOW (ref 3.87–5.11)
RBC: 3.82 MIL/uL — ABNORMAL LOW (ref 3.87–5.11)
RBC: 4.36 MIL/uL (ref 3.87–5.11)
RDW: 12.9 % (ref 11.5–15.5)
RDW: 12.9 % (ref 11.5–15.5)
RDW: 13.1 % (ref 11.5–15.5)
RDW: 13.1 % (ref 11.5–15.5)
RDW: 13.3 % (ref 11.5–15.5)
RDW: 13.6 % (ref 11.5–15.5)
WBC: 10.1 10*3/uL (ref 4.0–10.5)
WBC: 10.7 10*3/uL — ABNORMAL HIGH (ref 4.0–10.5)
WBC: 5 10*3/uL (ref 4.0–10.5)
WBC: 5.5 10*3/uL (ref 4.0–10.5)
WBC: 8.4 10*3/uL (ref 4.0–10.5)
WBC: 4 10*3/uL (ref 4.0–10.5)

## 2010-12-23 LAB — SURGICAL PCR SCREEN
MRSA, PCR: NEGATIVE
Staphylococcus aureus: NEGATIVE

## 2010-12-25 LAB — CBC
Hemoglobin: 11.5 g/dL — ABNORMAL LOW (ref 12.0–15.0)
Hemoglobin: 14.5 g/dL (ref 12.0–15.0)
MCHC: 34.1 g/dL (ref 30.0–36.0)
MCHC: 34.2 g/dL (ref 30.0–36.0)
RDW: 13.2 % (ref 11.5–15.5)
WBC: 5.7 10*3/uL (ref 4.0–10.5)
WBC: 9.9 10*3/uL (ref 4.0–10.5)

## 2010-12-25 LAB — SURGICAL PCR SCREEN
MRSA, PCR: NEGATIVE
Staphylococcus aureus: NEGATIVE

## 2010-12-29 LAB — URINALYSIS, ROUTINE W REFLEX MICROSCOPIC
Glucose, UA: NEGATIVE mg/dL
Protein, ur: NEGATIVE mg/dL
Urobilinogen, UA: 0.2 mg/dL (ref 0.0–1.0)

## 2010-12-29 LAB — DIFFERENTIAL
Basophils Absolute: 0 10*3/uL (ref 0.0–0.1)
Lymphocytes Relative: 9 % — ABNORMAL LOW (ref 12–46)
Monocytes Absolute: 0.9 10*3/uL (ref 0.1–1.0)
Monocytes Relative: 9 % (ref 3–12)
Neutro Abs: 7.7 10*3/uL (ref 1.7–7.7)

## 2010-12-29 LAB — COMPREHENSIVE METABOLIC PANEL
Albumin: 3.7 g/dL (ref 3.5–5.2)
BUN: 8 mg/dL (ref 6–23)
Calcium: 9.5 mg/dL (ref 8.4–10.5)
Creatinine, Ser: 0.91 mg/dL (ref 0.4–1.2)
Total Bilirubin: 0.4 mg/dL (ref 0.3–1.2)
Total Protein: 6.3 g/dL (ref 6.0–8.3)

## 2010-12-29 LAB — CBC
HCT: 43.5 % (ref 36.0–46.0)
MCV: 92.7 fL (ref 78.0–100.0)
Platelets: 141 10*3/uL — ABNORMAL LOW (ref 150–400)
RDW: 13.3 % (ref 11.5–15.5)

## 2010-12-29 LAB — URINE MICROSCOPIC-ADD ON

## 2011-02-24 NOTE — Assessment & Plan Note (Signed)
NAME:  [name] [name], [name] [name]              ACCOUNT NO.:  [id number]   MEDICAL RECORD NO.:  [id number]          PATIENT TYPE:  POB   LOCATION:  [facility]         FACILITY:  [facility]   PHYSICIAN:  Catalina Antigua, MD     DATE OF BIRTH:  [birth year]   DATE OF SERVICE:  05/28/2010                                  CLINIC NOTE   HISTORY:  This is a 50 year old, G5, P3-0-2-3, who presents today as a  referral from [facility] for evaluation of fibroid uterus and pelvic pain.  The patient states that she has had this pain over the past 3-5 years  and at times located in the lower abdomen, but also at times as a  generalized abdominal pain.  The patient was seen in the emergency room  for abdominal pain in mid June and had a CAT scan done which  demonstrated a fibroid uterus.  The patient also had a right upper  quadrant ultrasound performed at that time which was essentially normal.  The patient presents today for further evaluation.   PAST MEDICAL HISTORY:  Significant for arthritis and COPD.   PAST SURGICAL HISTORY:  Significant for several orthopedic surgery of  the hip.   PAST OBSTETRICAL HISTORY:  She has had 3 vaginal deliveries.   PAST GYN HISTORY:  She denies any cyst or history of abnormal Pap smear  or any STDs.   SOCIAL HISTORY:  She is a current smoker of one-and-half pack a day for  the past 36 years.  She is trying to quit.  She is currently taking  Zyban.  She denies drinking, smoking, or the use of illicit drugs.   FAMILY HISTORY:  Noncontributory.   REVIEW OF SYSTEMS:  Significant for joint pain, occasional hot flashes,  night sweats, and depression.   PHYSICAL EXAMINATION:  VITAL SIGNS:  Her blood pressure is 124/82, pulse  of 61, weight of 112 pounds, height of 4 feet 7 inches.  ABDOMEN:  Soft, nontender, and nondistended.  Bimanual exam, she had  palpable fibroid on the posterior aspect of the uterus.  Adnexal masses  are not palpated and the size of the uterus was  difficult to distinguish  secondary to the patient's voluntary guarding and discomfort during the  exam.   ASSESSMENT AND PLAN:  This is a 50 year old with fibroid uterus and  abdominal pain, who presents today for further evaluation.  The patient  is referred for a pelvic ultrasound.  The patient was provided with a  prescription for Vicodin for pain management.  The patient was  extensively counseled regarding the possible need for surgical  intervention as a definitive treatment of her pelvic pain.  The patient  is uncertain at this time regarding her desire for surgery.  The patient  is also scheduled to be seen by a gastroenterologist for further  evaluation of her abdominal pain on September 30.  The patient states  that she will await the following that appointment before making a  decision  regarding surgical intervention which I think it is more than  appropriate.  The patient is to return in 2 weeks for discussion of the  results of the ultrasound.  ______________________________  Catalina Antigua, MD     PC/MEDQ  D:  05/28/2010  T:  05/29/2010  Job:  [id number]

## 2011-02-24 NOTE — Assessment & Plan Note (Signed)
NAME:  [name] [name], [name] [name]              ACCOUNT NO.:  [id number]   MEDICAL RECORD NO.:  [id number]          PATIENT TYPE:  POB   LOCATION:  [facility] at [facility]         FACILITY:  [facility]   PHYSICIAN:  Tinnie Gens, MD        DATE OF BIRTH:  1960-11-17   DATE OF SERVICE:  08/19/2010                                  CLINIC NOTE   CHIEF COMPLAINT:  Postop check.   HISTORY OF PRESENT ILLNESS:  The patient is a 50 year old patient who  had a very complicated TLH.  She underwent TLH with a ureteral injury  and then a perc-neph tube.  She sees Urology today, but her perc-neph  continues to be there and she has had chronic low-grade infection with  that tube in.  I do not think she is taking Macrobid daily as was  prescribed to keep infection that day.  She was also upset and anxious,  nervous about doctors and service about Health Care in general since her  complicated postop course.  She breaks down in tears in the office today  and does not allow me to completely examine her and would like to have  gotten elected her vaginal cuff.  However, she did not tolerate this at  all today.   On exam, I did look at her abdomen and her incisions seemed well healed.  She is to follow up with Urology today who will probably repeat  antibiotics for her and hopefully will schedule her for a definitive  treatment from her left ureteral injury which were prior at the  laparotomy.  I am pleased to get the schedule for her.  She would do  better.  I have given her a prescription for lorazepam today as well as  I have increased her estrogen patch.  If she does not feel like it is  working the full 5 days and she has continued to have hot flashes  1.0375, we will increase this to 0.05 mg.  I did discuss with her and  her daughter smoking and estrogen risk and risk of DVT and how this is  really a low-dose estrogen.  We talked about risk of breast cancer and  how this is really associated with progesterone risk and  we will have  her not to stay on estrogen for a long period of time because the risk  is there and it has somewhat increased by her smoking.  This was  reviewed with the patient and her daughter today.  She will follow up in  3-6 weeks for definitive treatment.  I discussed risk factors and things  to look for if she has any problems with the vaginal cuff as she did not  tolerate me then taking a peek at it today.           ______________________________  Tinnie Gens, MD     TP/MEDQ  D:  08/19/2010  T:  08/20/2010  Job:  [id number]

## 2011-02-24 NOTE — Assessment & Plan Note (Signed)
NAME:  [name] [name], [name] [name]              ACCOUNT NO.:  [id number]   MEDICAL RECORD NO.:  [id number]          PATIENT TYPE:  POB   LOCATION:  [facility]         FACILITY:  [facility]   PHYSICIAN:  Tinnie Gens, MD        DATE OF BIRTH:  10-Oct-1961   DATE OF SERVICE:  09/02/2010                                  CLINIC NOTE   CHIEF COMPLAINT:  Postop check.   HISTORY OF PRESENT ILLNESS:  The patient is a 50 year old who had TLH on  July 24, 2010.  Since was also having ureteral injury and is status  post left percutaneous nephrostomy tube placement.  She is undergoing  definitive repair and surgery on December 1.  Last time she came in, we  were unable to evaluate the vaginal cuff secondary to pain and anxiety.  She returns today for that, she has no significant discharge.  She is  noting some small dots where she puts her estrogen patch, but her hot  flashes have improved.  She has cut down smoking to approximately 5 per  day.  She is using Zyban and not really using nicotine patch anymore.   On review of her medications today reveals Zyban 150 mg twice daily,  tramadol 50 mg as needed.  She stopped ibuprofen.  She is on estrogen  patch 0.05 mg twice weekly, lorazepam as needed.  She had a new  antispasmodic medicine called in by Urology yesterday.  She is unclear  of the name of this antibiotic.   PHYSICAL EXAMINATION:  VITAL SIGNS:  As in the chart.  She is a well-  developed, well-nourished female in no acute distress.  ABDOMEN:  Soft, nontender, nondistended.  Incisions are well healed.  PELVIC:  Vaginal exam reveals the cuff that is healing well with yellow  discharge but no odor and no granulation tissue is seen.   IMPRESSION:  Status post laparoscopic hysterectomy for fibroid uterus  with left ureteral injury and percutaneous nephrostomy tube placement  for definitive surgery on September 11, 2010.   PLAN:  We will replace her estrogen patch with Vivelle-Dot 0.05 mg apply  twice weekly and the patient will return in 6 weeks to see how she has  healed from her ureteral repair as well as she is instructed to call  back with any other needs or concerns.           ______________________________  Tinnie Gens, MD     TP/MEDQ  D:  09/02/2010  T:  09/03/2010  Job:  [id number]

## 2011-02-24 NOTE — Assessment & Plan Note (Signed)
NAME:  [name] [name], [name] [name]              ACCOUNT NO.:  [id number]   MEDICAL RECORD NO.:  [id number]          PATIENT TYPE:  POB   LOCATION:  [facility]         FACILITY:  [facility]   PHYSICIAN:  Tinnie Gens, MD        DATE OF BIRTH:  1961-03-18   DATE OF SERVICE:                                  CLINIC NOTE   CHIEF COMPLAINT:  Abdominal pain and fibroid uterus.   HISTORY OF PRESENT ILLNESS:  The patient is a 50 year old who was  previously seen by Dr. Catalina Antigua for abdominal pain.  She had a CAT  scan which showed a fibroid uterus.  At that time she was unclear about  exactly what she wanted to do in terms of surgical treatment, so she was  brought back again in 2 weeks with a GYN ultrasound.  The ultrasound was  reviewed today. It shows the uterus of 8.4 x 5.2 x 5.3 with multiple  fibroids including one in the posterior lower uterine segment.  The rest  of it in the mid body and off the fundus.  There appears to be a right  pedunculated fibroid as well.  There is also a solid mass noted in the  left adnexa that looks like a fibroid, however, they could not tell  definitively what it was neither the right nor the left ovary were  definitively seen.  A lengthy discussion was held with the patient.  She  has opted for hysterectomy.  Given the size of her uterus, I think LAVH  and/or TLH is probably warranted.  She has no idea if she has had a  history of abnormal Pap smears.  I think removing her cervix would be  beneficial also given that she is 15 and already having symptoms of  menopause, LEEP removal of her ovaries would also be beneficial.  Lengthy discussion about risks and benefits of the procedure including  bleeding, infection, injury to bowel, bladder, ureters, pneumonia, DVT  and anesthesia risk were all discussed with the patient and she agrees  to proceed.  Additionally, we discussed that this may not relieve her  pain.  However, the patient is fairly sure that  fibroids are causing her  pain.  Her pain is low in her pelvis and so we will get this scheduled  for her.  I did advise her to quit smoking approximately 2 weeks prior  to surgery also bowel prep as needed.  She was informed of this.           ______________________________  Tinnie Gens, MD     TP/MEDQ  D:  06/17/2010  T:  06/18/2010  Job:  [id number]

## 2011-02-27 NOTE — Assessment & Plan Note (Signed)
NAME:  [name] [name], [name] [name]              ACCOUNT NO.:  [id number]   MEDICAL RECORD NO.:  [id number]          PATIENT TYPE:  POB   LOCATION:  [facility]         FACILITY:  [facility]   PHYSICIAN:  Tinnie Gens, MD        DATE OF BIRTH:  [birth year]   DATE OF SERVICE:  10/14/2009                                  CLINIC NOTE   CHIEF COMPLAINT:  Postop followup.   HISTORY OF PRESENT ILLNESS:  A 50 year old patient who had a TLH on  July 24, 2010.  She had a ureteral injury and is status post  reimplantation of this on September 11, 2010 due to the double-J and at  that time, she continues her percutaneous nephrostomy tube.  She is  referred for removal under anesthesia of these in the next 2 days.  She  complains of left-sided lower abdominal pain with cramping and feeling  like something is tugging in her bladder, had a sensation of some kind  of push itself out.  She has lots of questions about stents, why is  causing her pain at first thing.   PHYSICAL EXAMINATION:  ABDOMEN:  Her incision is well healed.  GU:  Normal external female genitalia.  I do not see evidence of stent  come out through her urethra.   IMPRESSION:  Postop recovering well for double-J stent removal in the  next 2 days.   PLAN:  A lengthy discussion was held with the patient including drying  to explain some erythema happen to repair the double-J, the percutaneous  nephrostomy tube, the reason of all these tubes are still in her, how  much better she is going to feel once they are out, use of  antispasmodics as needed for pain control, continued hormone replacement  therapy, and pain relief.  The patient understands all these.  We will  have her followup in 4 weeks here to see how she is getting along and  hopefully return to work sometime in February 2012.           ______________________________  Tinnie Gens, MD     TP/MEDQ  D:  10/14/2010  T:  10/15/2010  Job:  [id number]

## 2011-02-27 NOTE — Op Note (Signed)
[facility]  Patient:    [name], [name]                     MRN: [id number] Proc. Date: 01/05/00 Adm. Date:  [id number] Attending:  Twana First                           Operative Report  PREOPERATIVE DIAGNOSES:  Right hip labrum tear with paralabral cyst.  POSTOPERATIVE DIAGNOSES: 1. Right hip labrum tear. 2. Right hip paralabral cyst. 3. Right hip degenerative joint disease.  PROCEDURE:  Right hip examination under anesthesia followed by arthroscopy with  debridement.  SURGEON:  Elana Alm. Thurston Hole, M.D.  ASSISTANT:  Kirstin Adelberger, P.A.  ANESTHESIA:  General.  OPERATIVE TIME:  One hour.  COMPLICATIONS:  None.  INDICATION FOR PROCEDURE:  [name] [name] is a 50 year old woman who has had increasing right hip pain for the past three months, much worse over the last six weeks.  X-rays and MRI are consistent with a labrum tear, paralabral cyst and early degenerative changes.  She has failed conservative care and is now to undergo arthroscopy.  DESCRIPTION OF PROCEDURE:  [name] [name] was brought to the operating room on January 05, 2000 and placed on the traction fracture table in a supine position.  After being placed under general anesthesia, her right hip was examined; forward flexion to 95, extension to 20 degrees, internal and external rotation of 50 degrees.  She received 1 g of IV Ancef preoperatively for prophylaxis.  She as then turned in the right-lateral-up decubitus position and her right leg was placed in longitudinal traction.  Her left leg was placed in foot and leg holder.  AP nd lateral x-rays confirmed satisfactory position.  The hip and leg were prepped using sterile Betadine and draped using sterile technique.  Initially, a spinal needle was placed from an anterolateral position over the greater trochanter under fluoroscopic control into the hip joint.  After this was done and the stylus removed, the  vacuum from the hip joint was released and the traction could be increased, distracting the hip by approximately 8 to 10 mm.  An arthroscope was  placed through this anterolateral area with an anterolateral portal and then a posterolateral portal was made over the posterior aspect of the greater trochanter, also under fluoroscopic control.  Initial inspection in the articular cartilage  showed grade 3 articular cartilage changes over the superior acetabulum, anterior acetabulum was intact, femoral head articular cartilage showed only minimal degenerative changes, the posterolateral labrum had a labral tear and some degenerative changes as well, grade 3 and mild grade 4 changes, which were debrided, just on the corner and the rim of the acetabulum and these were debrided. In the area of the paralabral cyst, the cyst itself could not be visualized but  this labrum tear, along with the paralabral cyst area, were thoroughly debrided  under arthroscopic control.  The fovea itself was found to be intact.  After this was done, the anterior labrum was inspected; it was found to be intact; the superior labrum inspected and it was found to be intact.  At this point, it was  felt that all pathology had been satisfactorily addressed.  The instruments were removed.  Portals were closed with 3-0 nylon suture.  Sterile dressings were applied, the patient turned supine, extubated and taken to the recovery room in  stable condition.  Needle and sponge count  was correct x 2 at the end of the case.  FOLLOWUP CARE:  [name] [name] will be followed as an outpatient on [place] NX for pain, touch-down weightbearing on crutches for 10 days to 14 days, range of motion allowed.  I will see her back in the office in a week for sutures out and followup.DD:  01/05/00 TD:  01/05/00 Job: [id number]

## 2011-03-03 ENCOUNTER — Encounter: Payer: Self-pay | Admitting: Family Medicine

## 2011-03-23 ENCOUNTER — Encounter: Payer: Self-pay | Admitting: Family Medicine

## 2011-03-24 ENCOUNTER — Ambulatory Visit: Payer: Commercial Indemnity | Admitting: Family Medicine

## 2011-04-23 NOTE — Assessment & Plan Note (Signed)
NAME:  [name] [name], [name] [name]              ACCOUNT NO.:  [id number]  MEDICAL RECORD NO.:  [id number]           PATIENT TYPE:  LOCATION:  [facility]           FACILITY:  PHYSICIAN:  Tinnie Gens, MD        DATE OF BIRTH:  [birth year]  DATE OF SERVICE:  12/10/2010                                 CLINIC NOTE  CHIEF COMPLAINT:  Followup.  HISTORY OF PRESENT ILLNESS:  The patient is a 50 year old patient who underwent a total laparoscopic hysterectomy with ureteral injury which was subsequently been repaired, has had her double-J stent removed as well as her percutaneous nephrostomy tube.  She is back to work and doing well.  She asked about her limitations and these were discussed. She has a little bit of twinge pain after working hard which we discussed is probably normal.  At her last visit we increased her Estratest and her estrogen patch was increased to 0.07, she is on Vivelle-Dot.  This has quelled her symptoms.  She is without significant hormonal fluctuations at this time.  On exam, her vitals is noted in the chart.  She is a well-developed, well-nourished female in no acute stress.  Abdomen is soft, nontender, nondistended.  IMPRESSION:  Status post final postop check after a TLH with ureteral injury.  The patient is doing well.  She did express to me resentment and anger over all that have happened to her, again apologies were made. She expresses that she gets me her forgiveness and when we talked about reasons why this happened, risks of surgery in general, risks of surgery related to her case specifically and the patient thinks like she is in a good place now.  She will follow up in 6 months to make sure her hormones are doing well.          ______________________________ Tinnie Gens, MD    TP/MEDQ  D:  12/10/2010  T:  12/10/2010  Job:  [id number]

## 2011-06-11 ENCOUNTER — Telehealth: Payer: Self-pay | Admitting: *Deleted

## 2011-06-11 DIAGNOSIS — J329 Chronic sinusitis, unspecified: Secondary | ICD-10-CM

## 2011-06-11 MED ORDER — AZITHROMYCIN 250 MG PO TABS: ORAL_TABLET | ORAL | Status: DC

## 2011-06-11 NOTE — Telephone Encounter (Signed)
Patient is complaining of sinus infection, going on for two weeks now.  She is unable to get into the office until next week.  She has been taking tylenol and sudafed.  We will call in a z-pack for her and an appointment was made for next Tuesday with Dr. Shawnie Pons, as it is time for her 6 month follow up from her surgery as well.

## 2011-06-16 ENCOUNTER — Ambulatory Visit (INDEPENDENT_AMBULATORY_CARE_PROVIDER_SITE_OTHER): Payer: Commercial Indemnity | Admitting: Family Medicine

## 2011-06-16 ENCOUNTER — Encounter: Payer: Self-pay | Admitting: Family Medicine

## 2011-06-16 DIAGNOSIS — N951 Menopausal and female climacteric states: Secondary | ICD-10-CM

## 2011-06-16 DIAGNOSIS — Z720 Tobacco use: Secondary | ICD-10-CM

## 2011-06-16 DIAGNOSIS — E8940 Asymptomatic postprocedural ovarian failure: Secondary | ICD-10-CM | POA: Insufficient documentation

## 2011-06-16 DIAGNOSIS — F419 Anxiety disorder, unspecified: Secondary | ICD-10-CM

## 2011-06-16 DIAGNOSIS — F411 Generalized anxiety disorder: Secondary | ICD-10-CM

## 2011-06-16 DIAGNOSIS — Z780 Asymptomatic menopausal state: Secondary | ICD-10-CM

## 2011-06-16 DIAGNOSIS — F17200 Nicotine dependence, unspecified, uncomplicated: Secondary | ICD-10-CM

## 2011-06-16 DIAGNOSIS — E8941 Symptomatic postprocedural ovarian failure: Secondary | ICD-10-CM

## 2011-06-16 MED ORDER — VARENICLINE TARTRATE 0.5 MG PO TABS: 0.5000 mg | ORAL_TABLET | Freq: Two times a day (BID) | ORAL | Status: AC

## 2011-06-16 MED ORDER — LORAZEPAM 0.5 MG PO TABS: 0.5000 mg | ORAL_TABLET | Freq: Three times a day (TID) | ORAL | Status: DC | PRN

## 2011-06-16 MED ORDER — ESTRADIOL 0.075 MG/24HR TD PTTW: 1.0000 | MEDICATED_PATCH | TRANSDERMAL | Status: DC

## 2011-06-16 NOTE — Progress Notes (Signed)
  Subjective:    Patient ID: [name] [name], female    DOB: 10/01/1961, 50 y.o.   MRN: [id number]  HPI Comments: No problems from surgery.  Hormones may or may not be working well. Wants to quit smoking. S/p treatment for sinusitis with Z-pack, desires exam for this.     Review of Systems  Constitutional: Negative for fever, activity change, appetite change and fatigue.  HENT: Positive for congestion, rhinorrhea and sinus pressure. Negative for facial swelling.   Eyes: Negative for pain.  Respiratory: Negative for cough and shortness of breath.   Gastrointestinal: Negative for abdominal pain and abdominal distention.  Genitourinary: Negative for frequency and flank pain.       Objective:   Physical Exam  Constitutional: She is oriented to person, place, and time. She appears well-developed and well-nourished.  HENT:  Head: Normocephalic and atraumatic.       Sinus tenderness  Cardiovascular: Normal rate and regular rhythm.   Pulmonary/Chest: Effort normal and breath sounds normal.  Abdominal: Soft. Bowel sounds are normal.  Neurological: She is alert and oriented to person, place, and time.          Assessment & Plan:  Tobacco use-trial of chantix--side fx discussed. Trial of Mucinex for sinuses. Continue hormones. Refilled lorazepam today

## 2011-06-16 NOTE — Patient Instructions (Signed)
Smoking Cessation This document explains the best ways for you to quit smoking and new treatments to help. It lists new medicines that can double or triple your chances of quitting and quitting for good. It also considers ways to avoid relapses and concerns you may have about quitting, including weight gain. NICOTINE: A POWERFUL ADDICTION If you have tried to quit smoking, you know how hard it can be. It is hard because nicotine is a very addictive drug. For some people, it can be as addictive as heroin or cocaine. Usually, people make 2 or 3 tries, or more, before finally being able to quit. Each time you try to quit, you can learn about what helps and what hurts. Quitting takes hard work and a lot of effort, but you can quit smoking. QUITTING SMOKING IS ONE OF THE MOST IMPORTANT THINGS YOU WILL EVER DO:  You will live longer, feel better, and live better.   The impact on your body of quitting smoking is felt almost immediately:   Within 20 minutes, blood pressure decreases. Pulse returns to its normal level.   After 8 hours, carbon monoxide levels in the blood return to normal. Oxygen level increases.   After 24 hours, chance of heart attack starts to decrease. Breath, hair, and body stop smelling like smoke.   After 48 hours, damaged nerve endings begin to recover. Sense of taste and smell improve.   After 72 hours, the body is virtually free of nicotine. Bronchial tubes relax and breathing becomes easier.   After 2 to 12 weeks, lungs can hold more air. Exercise becomes easier and circulation improves.   Quitting will lower your chance of having a heart attack, stroke, cancer, or lung disease:   After 1 year, the risk of coronary heart disease is cut in half.   After 5 years, the risk of stroke falls to the same as a nonsmoker.   After 10 years, the risk of lung cancer is cut in half and the risk of other cancers decreases significantly.   After 15 years, the risk of coronary heart  disease drops, usually to the level of a nonsmoker.   If you are pregnant, quitting smoking will improve your chances of having a healthy baby.   The people you live with, especially your children, will be healthier.   You will have extra money to spend on things other than cigarettes.  FIVE KEYS TO QUITTING Studies have shown that these 5 steps will help you quit smoking and quit for good. You have the best chances of quitting if you use them together: 1. Get ready.  2. Get support and encouragement.  3. Learn new skills and behaviors.  4. Get medicine to reduce your nicotine addiction and use it correctly.  5. Be prepared for relapse or difficult situations. Be determined to continue trying to quit, even if you do not succeed at first.  1. GET READY  Set a quit date.   Change your environment.   Get rid of ALL cigarettes, ashtrays, matches, and lighters in your home, car, and place of work.   Do not let people smoke in your home.   Review your past attempts to quit. Think about what worked and what did not.   Once you quit, do not smoke. NOT EVEN A PUFF!  2. GET SUPPORT AND ENCOURAGEMENT Studies have shown that you have a better chance of being successful if you have help. You can get support in many ways.  Tell   your family, friends, and coworkers that you are going to quit and need their support. Ask them not to smoke around you.   Talk to your caregivers (doctor, dentist, nurse, pharmacist, psychologist, and/or smoking counselor).   Get individual, group, or telephone counseling and support. The more counseling you have, the better your chances are of quitting. Programs are available at local hospitals and health centers. Call your local health department for information about programs in your area.   Spiritual beliefs and practices may help some smokers quit.   Quit meters are small computer programs online or downloadable that keep track of quit statistics, such as amount  of "quit-time," cigarettes not smoked, and money saved.   Many smokers find one or more of the many self-help books available useful in helping them quit and stay off tobacco.  3. LEARN NEW SKILLS AND BEHAVIORS  Try to distract yourself from urges to smoke. Talk to someone, go for a walk, or occupy your time with a task.   When you first try to quit, change your routine. Take a different route to work. Drink tea instead of coffee. Eat breakfast in a different place.   Do something to reduce your stress. Take a hot bath, exercise, or read a book.   Plan something enjoyable to do every day. Reward yourself for not smoking.   Explore interactive web-based programs that specialize in helping you quit.  4. GET MEDICINE AND USE IT CORRECTLY Medicines can help you stop smoking and decrease the urge to smoke. Combining medicine with the above behavioral methods and support can quadruple your chances of successfully quitting smoking. The U.[place]. Food and Drug Administration (FDA) has approved 7 medicines to help you quit smoking. These medicines fall into 3 categories.  Nicotine replacement therapy (delivers nicotine to your body without the negative effects and risks of smoking):   Nicotine gum: Available over-the-counter.   Nicotine lozenges: Available over-the-counter.   Nicotine inhaler: Available by prescription.   Nicotine nasal spray: Available by prescription.   Nicotine skin patches (transdermal): Available by prescription and over-the-counter.   Antidepressant medicine (helps people abstain from smoking, but how this works is unknown):   Bupropion sustained-release (SR) tablets: Available by prescription.   Nicotinic receptor partial agonist (simulates the effect of nicotine in your brain):   Varenicline tartrate tablets: Available by prescription.   Ask your caregiver for advice about which medicines to use and how to use them. Carefully read the information on the package.    Everyone who is trying to quit may benefit from using a medicine. If you are pregnant or trying to become pregnant, nursing an infant, you are under age 18, or you smoke fewer than 10 cigarettes per day, talk to your caregiver before taking any nicotine replacement medicines.   You should stop using a nicotine replacement product and call your caregiver if you experience nausea, dizziness, weakness, vomiting, fast or irregular heartbeat, mouth problems with the lozenge or gum, or redness or swelling of the skin around the patch that does not go away.   Do not use any other product containing nicotine while using a nicotine replacement product.   Talk to your caregiver before using these products if you have diabetes, heart disease, asthma, stomach ulcers, you had a recent heart attack, you have high blood pressure that is not controlled with medicine, a history of irregular heartbeat, or you have been prescribed medicine to help you quit smoking.  5. BE PREPARED FOR RELAPSE OR   DIFFICULT SITUATIONS  Most relapses occur within the first 3 months after quitting. Do not be discouraged if you start smoking again. Remember, most people try several times before they finally quit.   You may have symptoms of withdrawal because your body is used to nicotine. You may crave cigarettes, be irritable, feel very hungry, cough often, get headaches, or have difficulty concentrating.   The withdrawal symptoms are only temporary. They are strongest when you first quit, but they will go away within 10 to 14 days.  Here are some difficult situations to watch for:  Alcohol. Avoid drinking alcohol. Drinking lowers your chances of successfully quitting.   Caffeine. Try to reduce the amount of caffeine you consume. It also lowers your chances of successfully quitting.   Other smokers. Being around smoking can make you want to smoke. Avoid smokers.   Weight gain. Many smokers will gain weight when they quit, usually  less than 10 pounds. Eat a healthy diet and stay active. Do not let weight gain distract you from your main goal, quitting smoking. Some medicines that help you quit smoking may also help delay weight gain. You can always lose the weight gained after you quit.   Bad mood or depression. There are a lot of ways to improve your mood other than smoking.  If you are having problems with any of these situations, talk to your caregiver. SPECIAL SITUATIONS OR CONDITIONS Studies suggest that everyone can quit smoking. Your situation or condition can give you a special reason to quit.  Pregnant women/New mothers: By quitting, you protect your baby's health and your own.   Hospitalized patients: By quitting, you reduce health problems and help healing.   Heart attack patients: By quitting, you reduce your risk of a second heart attack.   Lung, head, and neck cancer patients: By quitting, you reduce your chance of a second cancer.   Parents of children and adolescents: By quitting, you protect your children from illnesses caused by secondhand smoke.  QUESTIONS TO THINK ABOUT Think about the following questions before you try to stop smoking. You may want to talk about your answers with your caregiver.  Why do you want to quit?   If you tried to quit in the past, what helped and what did not?   What will be the most difficult situations for you after you quit? How will you plan to handle them?   Who can help you through the tough times? Your family? Friends? Caregiver?   What pleasures do you get from smoking? What ways can you still get pleasure if you quit?  Here are some questions to ask your caregiver:  How can you help me to be successful at quitting?   What medicine do you think would be best for me and how should I take it?   What should I do if I need more help?   What is smoking withdrawal like? How can I get information on withdrawal?  Quitting takes hard work and a lot of effort,  but you can quit smoking. FOR MORE INFORMATION Smokefree.gov (http://www.smokefree.gov) provides free, accurate, evidence-based information and professional assistance to help support the immediate and long-term needs of people trying to quit smoking. Document Released: 09/22/2001 Document Re-Released: 03/18/2010 [employer] Patient Information 2011 [employer]. 

## 2011-06-16 NOTE — Progress Notes (Signed)
6 month follow up from surgery

## 2011-07-22 ENCOUNTER — Encounter: Payer: Self-pay | Admitting: Internal Medicine

## 2011-11-17 ENCOUNTER — Ambulatory Visit (INDEPENDENT_AMBULATORY_CARE_PROVIDER_SITE_OTHER): Payer: Commercial Indemnity | Admitting: Family Medicine

## 2011-11-17 ENCOUNTER — Encounter: Payer: Self-pay | Admitting: Family Medicine

## 2011-11-17 VITALS — BP 120/70 | HR 81 | Temp 97.9°F | Ht 59.0 in | Wt 119.8 lb

## 2011-11-17 DIAGNOSIS — F411 Generalized anxiety disorder: Secondary | ICD-10-CM

## 2011-11-17 DIAGNOSIS — M67919 Unspecified disorder of synovium and tendon, unspecified shoulder: Secondary | ICD-10-CM | POA: Insufficient documentation

## 2011-11-17 DIAGNOSIS — M719 Bursopathy, unspecified: Secondary | ICD-10-CM

## 2011-11-17 DIAGNOSIS — N390 Urinary tract infection, site not specified: Secondary | ICD-10-CM | POA: Insufficient documentation

## 2011-11-17 DIAGNOSIS — F419 Anxiety disorder, unspecified: Secondary | ICD-10-CM

## 2011-11-17 LAB — POCT URINALYSIS DIPSTICK
Bilirubin, UA: NEGATIVE
Leukocytes, UA: NEGATIVE
Nitrite, UA: NEGATIVE
pH, UA: 6

## 2011-11-17 MED ORDER — LORAZEPAM 0.5 MG PO TABS: 0.5000 mg | ORAL_TABLET | Freq: Three times a day (TID) | ORAL | Status: DC | PRN

## 2011-11-17 MED ORDER — MELOXICAM 7.5 MG PO TABS: 7.5000 mg | ORAL_TABLET | Freq: Every day | ORAL | Status: DC

## 2011-11-17 NOTE — Patient Instructions (Signed)
You have rotator cuff impingement Try to avoid painful activities (overhead activities, lifting with extended arm) as much as possible. Take Mobic and Tylenol for pain (please dont take ibuprofen or alleve with the mobic). Subacromial injection may be beneficial to help with pain and to decrease inflammation. Home exercise program as discussed. If not improving at follow-up we will considerphysical therapy.

## 2011-11-17 NOTE — Progress Notes (Signed)
Subjective:    Patient ID: [name] [name], female    DOB: 1961/07/30, 51 y.o.   MRN: [id number]  HPI  51 yo here for:  1.  UC follow up for UTI. Went to UC two nights ago for dysuria and suprapubic pressure. Given Keflex, feels a little better. No fevers, nausea or vomiting. No noticible hematuria. Has not seen urologist in over a year.  2.  Left should pain- last several months, worsening pain when she lifts arm above head at work. Sometimes has pain in elbow too. No tingling or UE weakness.  Has tried elbow strap which has not helped.    Patient Active Problem List  Diagnoses  . HYPERLIPIDEMIA  . TOBACCO ABUSE  . DEPRESSION  . OSTEOARTHRITIS  . ABDOMINAL PAIN OTHER SPECIFIED SITE  . NONSPECIFIC ABN FINDNG RAD&OTH EXAM BILARY TRCT  . MAMMOGRAM, ABNORMAL, LEFT  . Surgical menopause   Past Medical History  Diagnosis Date  . Depression   . HLD (hyperlipidemia)   . Tobacco abuse   . OA (osteoarthritis)   . Anxiety disorder   . COPD (chronic obstructive pulmonary disease)   . Fibroids     Uterine  . Vitiligo 1975   Past Surgical History  Procedure Date  . Hip arthroscopy w/ labral repair    History  Substance Use Topics  . Smoking status: Current Everyday Smoker -- 1.5 packs/day for 38 years    Types: Cigarettes  . Smokeless tobacco: Not on file  . Alcohol Use: No   No family history on file. Allergies  Allergen Reactions  . Codeine     REACTION: Nausea   Current Outpatient Prescriptions on File Prior to Visit  Medication Sig Dispense Refill  . estradiol (VIVELLE-DOT) 0.075 MG/24HR Place 1 patch onto the skin 2 (two) times a week.  8 patch  12  . LORazepam (ATIVAN) 0.5 MG tablet Take 1 tablet (0.5 mg total) by mouth every 8 (eight) hours as needed.  30 tablet  2  . traMADol (ULTRAM) 50 MG tablet Take 50-100 mg by mouth 2 (two) times daily as needed.         The PMH, PSH, Social History, Family History, Medications, and allergies have been reviewed in  [facility], and have been updated if relevant.   Review of Systems See HPI     Objective:   Physical Exam BP 120/70  Pulse 81  Temp(Src) 97.9 F (36.6 C) (Oral)  Ht 4\' 11"  (1.499 m)  Wt 119 lb 12.8 oz (54.341 kg)  BMI 24.20 kg/m2  SpO2 99%  General:  Well-developed,well-nourished,in no acute distress; alert,appropriate and cooperative throughout examination Head:  normocephalic and atraumatic.   Lungs:  Normal respiratory effort, chest expands symmetrically. Lungs are clear to auscultation, no crackles or wheezes. Heart:  Normal rate and regular rhythm. S1 and S2 normal without gallop, murmur, click, rub or other extra sounds. Abdomen:  Bowel sounds positive,abdomen soft and non-tender without masses, organomegaly or hernias noted, NO CVA tenderness Msk:  No deformity or scoliosis noted of thoracic or lumbar spine.   Extremities: + arch sign on left, pos empty can, normal grip strength bilaterally with FROM. Neurologic:  alert & oriented X3 and gait normal.   Skin:  Intact without suspicious lesions or rashes Psych:  Cognition and judgment appear intact. Alert and cooperative with normal attention span and concentration. No apparent delusions, illusions, hallucinations     Assessment & Plan:   1. UTI (lower urinary tract infection)   UA  neg today- advised to finish abx.  2. Rotator cuff dysfunction   NSAIDs, exercises discussed. Please see pt instructions for details.

## 2012-03-17 ENCOUNTER — Telehealth: Payer: Self-pay

## 2012-03-17 NOTE — Telephone Encounter (Signed)
Patient called to make her annual appointment. She will run out of her estrogen patch by then. I called in one refill to get her through till her appointment. She comes to see [place] on July 2nd. Called it in to [facility] on [address].

## 2012-03-21 ENCOUNTER — Ambulatory Visit (INDEPENDENT_AMBULATORY_CARE_PROVIDER_SITE_OTHER): Payer: Commercial Indemnity | Admitting: Family Medicine

## 2012-03-21 ENCOUNTER — Encounter: Payer: Self-pay | Admitting: Family Medicine

## 2012-03-21 VITALS — BP 154/88 | HR 76 | Temp 97.9°F | Wt 115.0 lb

## 2012-03-21 DIAGNOSIS — R519 Headache, unspecified: Secondary | ICD-10-CM | POA: Insufficient documentation

## 2012-03-21 DIAGNOSIS — M542 Cervicalgia: Secondary | ICD-10-CM | POA: Insufficient documentation

## 2012-03-21 DIAGNOSIS — R51 Headache: Secondary | ICD-10-CM | POA: Insufficient documentation

## 2012-03-21 DIAGNOSIS — R42 Dizziness and giddiness: Secondary | ICD-10-CM | POA: Insufficient documentation

## 2012-03-21 MED ORDER — CYCLOBENZAPRINE HCL 5 MG PO TABS: 5.0000 mg | ORAL_TABLET | Freq: Three times a day (TID) | ORAL | Status: DC | PRN

## 2012-03-21 NOTE — Patient Instructions (Signed)
Good to see you. Please stop by to see Marion on your way out to set up your MRI.   

## 2012-03-21 NOTE — Progress Notes (Signed)
Subjective:    Patient ID: [name] [name], female    DOB: 15-Aug-1961, 51 y.o.   MRN: [id number]  HPI  51 yo here for several months of worsening non specific complaints.  Dizzy and lightheaded, on and off for months.  Sometimes associated with headache, other times random.  Changes in head position do not seem to effect these symptoms.  Does have periods of vomiting with occipital headache- "feel different than a migraine."  Over past few weeks, feels legs are week.  Appetite ok.  Never has had a big appetite. Wt Readings from Last 3 Encounters:  03/21/12 115 lb (52.164 kg)  11/17/11 119 lb 12.8 oz (54.341 kg)  06/16/11 116 lb (52.617 kg)   No CP or SOB.  Has felt on a knot in left side of her neck for months, sometimes has radiculopathy in left arm. No left arm weakness.  Patient Active Problem List  Diagnoses  . HYPERLIPIDEMIA  . TOBACCO ABUSE  . DEPRESSION  . OSTEOARTHRITIS  . ABDOMINAL PAIN OTHER SPECIFIED SITE  . NONSPECIFIC ABN FINDNG RAD&OTH EXAM BILARY TRCT  . MAMMOGRAM, ABNORMAL, LEFT  . Surgical menopause  . UTI (lower urinary tract infection)  . Rotator cuff dysfunction  . Headache  . Dizziness  . Neck pain   Past Medical History  Diagnosis Date  . Depression   . HLD (hyperlipidemia)   . Tobacco abuse   . OA (osteoarthritis)   . Anxiety disorder   . COPD (chronic obstructive pulmonary disease)   . Fibroids     Uterine  . Vitiligo 1975   Past Surgical History  Procedure Date  . Hip arthroscopy w/ labral repair    History  Substance Use Topics  . Smoking status: Current Everyday Smoker -- 1.5 packs/day for 38 years    Types: Cigarettes  . Smokeless tobacco: Not on file  . Alcohol Use: No   No family history on file. Allergies  Allergen Reactions  . Codeine     REACTION: Nausea   Current Outpatient Prescriptions on File Prior to Visit  Medication Sig Dispense Refill  . estradiol (VIVELLE-DOT) 0.075 MG/24HR Place 1 patch onto the skin 2  (two) times a week.  8 patch  12  . ibuprofen (ADVIL,MOTRIN) 600 MG tablet Take 600 mg by mouth every 6 (six) hours as needed.      Marland Kitchen LORazepam (ATIVAN) 0.5 MG tablet Take 1 tablet (0.5 mg total) by mouth every 8 (eight) hours as needed.  30 tablet  2  . traMADol (ULTRAM) 50 MG tablet Take 50-100 mg by mouth 2 (two) times daily as needed.         The PMH, PSH, Social History, Family History, Medications, and allergies have been reviewed in [facility], and have been updated if relevant.   Review of Systems See HPI No blurred vision No difficulty with gait No blood in stool No diarrhea    Objective:   Physical Exam BP 154/88  Pulse 76  Temp 97.9 F (36.6 C)  Wt 115 lb (52.164 kg)  General:  Well-developed,well-nourished,in no acute distress; alert,appropriate and cooperative throughout examination Head:  normocephalic and atraumatic.   Eyes:  vision grossly intact, pupils equal, pupils round, and pupils reactive to light.   Ears:  R ear normal and L ear normal.   Nose:  no external deformity.   Mouth:  good dentition.   Neck:  No deformities, masses, or tenderness noted. Lungs:  Normal respiratory effort, chest expands symmetrically. Lungs are  clear to auscultation, no crackles or wheezes. Heart:  Normal rate and regular rhythm. S1 and S2 normal without gallop, murmur, click, rub or other extra sounds. Abdomen:  Bowel sounds positive,abdomen soft and non-tender without masses, organomegaly or hernias noted. Msk:  No deformity or scoliosis noted of thoracic or lumbar spine.   Extremities:  No clubbing, cyanosis, edema, or deformity noted with normal full range of motion of all joints.   Neurologic:  alert & oriented X3 and gait normal, strength normal in 4 extremities.   Skin:  Intact without suspicious lesions or rashes Psych:  Cognition and judgment appear intact. Alert and cooperative with normal attention span and concentration. No apparent delusions, illusions, hallucinations      Assessment & Plan:   1. Headache  ?migraine variant however with multiple other symptoms, this is concerning for intracranial process.  Will get MR of brain and cervical spine given duration of symptoms. The patient indicates understanding of these issues and agrees with the plan.  MR Brain W Contrast, MR Cervical Spine Wo Contrast  2. Dizziness  See above. EKG NSR. Check labs to rule out other possible contributing factors. The patient indicates understanding of these issues and agrees with the plan.  CBC with Differential, Comprehensive metabolic panel, TSH, EKG 12-Lead  3. Neck pain  See #1. MR Cervical Spine Wo Contrast, EKG 12-Lead

## 2012-03-22 LAB — COMPREHENSIVE METABOLIC PANEL
ALT: 12 U/L (ref 0–35)
Albumin: 4.1 g/dL (ref 3.5–5.2)
CO2: 28 mEq/L (ref 19–32)
Calcium: 9.2 mg/dL (ref 8.4–10.5)
Chloride: 109 mEq/L (ref 96–112)
GFR: 74.01 mL/min (ref >60.00)
Potassium: 4.1 mEq/L (ref 3.5–5.1)
Sodium: 143 mEq/L (ref 135–145)
Total Protein: 6.6 g/dL (ref 6.0–8.3)

## 2012-03-22 LAB — CBC WITH DIFFERENTIAL/PLATELET
Basophils Absolute: 0.1 10*3/uL (ref 0.0–0.1)
Hemoglobin: 15.1 g/dL — ABNORMAL HIGH (ref 12.0–15.0)
Lymphocytes Relative: 20.1 % (ref 12.0–46.0)
Monocytes Relative: 6.8 % (ref 3.0–12.0)
Neutro Abs: 6.1 10*3/uL (ref 1.4–7.7)
Neutrophils Relative %: 71.1 % (ref 43.0–77.0)
Platelets: 153 10*3/uL (ref 150.0–400.0)
RDW: 12.9 % (ref 11.5–14.6)

## 2012-03-24 ENCOUNTER — Other Ambulatory Visit: Payer: Self-pay | Admitting: Family Medicine

## 2012-03-24 DIAGNOSIS — R51 Headache: Secondary | ICD-10-CM

## 2012-03-24 DIAGNOSIS — R42 Dizziness and giddiness: Secondary | ICD-10-CM

## 2012-04-12 ENCOUNTER — Encounter: Payer: Self-pay | Admitting: Family Medicine

## 2012-04-12 ENCOUNTER — Ambulatory Visit (INDEPENDENT_AMBULATORY_CARE_PROVIDER_SITE_OTHER): Payer: Commercial Indemnity | Admitting: Family Medicine

## 2012-04-12 VITALS — BP 132/93 | HR 87 | Ht 59.0 in | Wt 116.0 lb

## 2012-04-12 DIAGNOSIS — L80 Vitiligo: Secondary | ICD-10-CM

## 2012-04-12 DIAGNOSIS — Z780 Asymptomatic menopausal state: Secondary | ICD-10-CM

## 2012-04-12 DIAGNOSIS — Z01419 Encounter for gynecological examination (general) (routine) without abnormal findings: Secondary | ICD-10-CM

## 2012-04-12 DIAGNOSIS — E8941 Symptomatic postprocedural ovarian failure: Secondary | ICD-10-CM

## 2012-04-12 DIAGNOSIS — M1990 Unspecified osteoarthritis, unspecified site: Secondary | ICD-10-CM

## 2012-04-12 DIAGNOSIS — E8940 Asymptomatic postprocedural ovarian failure: Secondary | ICD-10-CM

## 2012-04-12 MED ORDER — CELECOXIB 200 MG PO CAPS: 200.0000 mg | ORAL_CAPSULE | Freq: Every day | ORAL | Status: AC

## 2012-04-12 MED ORDER — ESTRADIOL 0.075 MG/24HR TD PTTW: 1.0000 | MEDICATED_PATCH | TRANSDERMAL | Status: DC

## 2012-04-12 NOTE — Assessment & Plan Note (Signed)
Refill Vivelle

## 2012-04-12 NOTE — Progress Notes (Signed)
  Subjective:     [name] [name] is a 51 y.o. female and is here for a comprehensive physical exam. The patient reports problems - being seen by her PCP with a multitude of complaints.  She had a sore neck and back and nausea and vomiting.  Had lots of blood work that was essentially normal.  Thinks her neck pain was from her new pillow and mattress.  Her nausea is improved and she thinks it was a virus that she is over.Marland Kitchen  History   Social History  . Marital Status: Married    Spouse Name: N/A    Number of Children: 3  . 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 Smoking status: Current Everyday Smoker -- 1.5 packs/day for 38 years    Types: Cigarettes  . Smokeless tobacco: Not on file  . Alcohol Use: No  . Drug Use: Yes     Marijuana  . Sexually Active: Not on file   Other Topics Concern  . Not on file   Social History Narrative   [place] 1 [place]2 PPD x 38 years3 childrenManager at [employer] caffeine use: 6-8No alcoholIllicit drug use-marijuana   Health Maintenance  Topic Date Due  . Mammogram  06/17/2011  . Colonoscopy  06/17/2011  . Influenza Vaccine  07/12/2012  . Pap Smear  02/06/2013  . Tetanus/tdap  02/07/2020    The following portions of the patient's history were reviewed and updated as appropriate: allergies, current medications, past family history, past medical history, past social history, past surgical history and problem list.  Review of Systems A comprehensive review of systems was negative.   Objective:    BP 132/93  Pulse 87  Ht 4\' 11"  (1.499 m)  Wt 116 lb (52.617 kg)  BMI 23.43 kg/m2 General appearance: alert, cooperative and appears stated age Head: Normocephalic, without obvious abnormality, atraumatic Neck: no adenopathy, supple, symmetrical, trachea midline and thyroid not enlarged, symmetric, no tenderness/mass/nodules Lungs: clear to auscultation  bilaterally Breasts: normal appearance, no masses or tenderness Heart: regular rate and rhythm, S1, S2 normal, no murmur, click, rub or gallop Abdomen: soft, non-tender; bowel sounds normal; no masses,  no organomegaly Extremities: extremities normal, atraumatic, no cyanosis or edema Pulses: 2+ and symmetric Skin: Skin color, texture, turgor normal. No rashes or lesions Lymph nodes: Cervical, supraclavicular, and axillary nodes normal. Neurologic: Grossly normal  Pelvic:  Deferred, pt. Has no pelvic organs. Assessment:    Healthy female exam. Smoker.  Surgical menopause.     Plan:    No need for pap.  Refilled her vivelle, counseled on weaning down. Mammogram scheduled.  She will think about colonoscopy. See After Visit Summary for Counseling Recommendations

## 2012-04-12 NOTE — Progress Notes (Signed)
Wants Celebrex rx for Right hip arthritis

## 2012-04-12 NOTE — Patient Instructions (Signed)
Preventive Care for Adults, Female A healthy lifestyle and preventive care can promote health and wellness. Preventive health guidelines for women include the following key practices.  A routine yearly physical is a good way to check with your caregiver about your health and preventive screening. It is a chance to share any concerns and updates on your health, and to receive a thorough exam.   Visit your dentist for a routine exam and preventive care every 6 months. Brush your teeth twice a day and floss once a day. Good oral hygiene prevents tooth decay and gum disease.   The frequency of eye exams is based on your age, health, family medical history, use of contact lenses, and other factors. Follow your caregiver's recommendations for frequency of eye exams.   Eat a healthy diet. Foods like vegetables, fruits, whole grains, low-fat dairy products, and lean protein foods contain the nutrients you need without too many calories. Decrease your intake of foods high in solid fats, added sugars, and salt. Eat the right amount of calories for you.Get information about a proper diet from your caregiver, if necessary.   Regular physical exercise is one of the most important things you can do for your health. Most adults should get at least 150 minutes of moderate-intensity exercise (any activity that increases your heart rate and causes you to sweat) each week. In addition, most adults need muscle-strengthening exercises on 2 or more days a week.   Maintain a healthy weight. The body mass index (BMI) is a screening tool to identify possible weight problems. It provides an estimate of body fat based on height and weight. Your caregiver can help determine your BMI, and can help you achieve or maintain a healthy weight.For adults 20 years and older:   A BMI below 18.5 is considered underweight.   A BMI of 18.5 to 24.9 is normal.   A BMI of 25 to 29.9 is considered overweight.   A BMI of 30 and above is  considered obese.   Maintain normal blood lipids and cholesterol levels by exercising and minimizing your intake of saturated fat. Eat a balanced diet with plenty of fruit and vegetables. Blood tests for lipids and cholesterol should begin at age 20 and be repeated every 5 years. If your lipid or cholesterol levels are high, you are over 50, or you are at high risk for heart disease, you may need your cholesterol levels checked more frequently.Ongoing high lipid and cholesterol levels should be treated with medicines if diet and exercise are not effective.   If you smoke, find out from your caregiver how to quit. If you do not use tobacco, do not start.   If you are pregnant, do not drink alcohol. If you are breastfeeding, be very cautious about drinking alcohol. If you are not pregnant and choose to drink alcohol, do not exceed 1 drink per day. One drink is considered to be 12 ounces (355 mL) of beer, 5 ounces (148 mL) of wine, or 1.5 ounces (44 mL) of liquor.   Avoid use of street drugs. Do not share needles with anyone. Ask for help if you need support or instructions about stopping the use of drugs.   High blood pressure causes heart disease and increases the risk of stroke. Your blood pressure should be checked at least every 1 to 2 years. Ongoing high blood pressure should be treated with medicines if weight loss and exercise are not effective.   If you are 55 to 51   years old, ask your caregiver if you should take aspirin to prevent strokes.   Diabetes screening involves taking a blood sample to check your fasting blood sugar level. This should be done once every 3 years, after age 45, if you are within normal weight and without risk factors for diabetes. Testing should be considered at a younger age or be carried out more frequently if you are overweight and have at least 1 risk factor for diabetes.   Breast cancer screening is essential preventive care for women. You should practice "breast  self-awareness." This means understanding the normal appearance and feel of your breasts and may include breast self-examination. Any changes detected, no matter how small, should be reported to a caregiver. Women in their 20s and 30s should have a clinical breast exam (CBE) by a caregiver as part of a regular health exam every 1 to 3 years. After age 40, women should have a CBE every year. Starting at age 40, women should consider having a mammography (breast X-ray test) every year. Women who have a family history of breast cancer should talk to their caregiver about genetic screening. Women at a high risk of breast cancer should talk to their caregivers about having magnetic resonance imaging (MRI) and a mammography every year.   The Pap test is a screening test for cervical cancer. A Pap test can show cell changes on the cervix that might become cervical cancer if left untreated. A Pap test is a procedure in which cells are obtained and examined from the lower end of the uterus (cervix).   Women should have a Pap test starting at age 21.   Between ages 21 and 29, Pap tests should be repeated every 2 years.   Beginning at age 30, you should have a Pap test every 3 years as long as the past 3 Pap tests have been normal.   Some women have medical problems that increase the chance of getting cervical cancer. Talk to your caregiver about these problems. It is especially important to talk to your caregiver if a new problem develops soon after your last Pap test. In these cases, your caregiver may recommend more frequent screening and Pap tests.   The above recommendations are the same for women who have or have not gotten the vaccine for human papillomavirus (HPV).   If you had a hysterectomy for a problem that was not cancer or a condition that could lead to cancer, then you no longer need Pap tests. Even if you no longer need a Pap test, a regular exam is a good idea to make sure no other problems are  starting.   If you are between ages 65 and 70, and you have had normal Pap tests going back 10 years, you no longer need Pap tests. Even if you no longer need a Pap test, a regular exam is a good idea to make sure no other problems are starting.   If you have had past treatment for cervical cancer or a condition that could lead to cancer, you need Pap tests and screening for cancer for at least 20 years after your treatment.   If Pap tests have been discontinued, risk factors (such as a new sexual partner) need to be reassessed to determine if screening should be resumed.   The HPV test is an additional test that may be used for cervical cancer screening. The HPV test looks for the virus that can cause the cell changes on the cervix.   The cells collected during the Pap test can be tested for HPV. The HPV test could be used to screen women aged 30 years and older, and should be used in women of any age who have unclear Pap test results. After the age of 30, women should have HPV testing at the same frequency as a Pap test.   Colorectal cancer can be detected and often prevented. Most routine colorectal cancer screening begins at the age of 50 and continues through age 75. However, your caregiver may recommend screening at an earlier age if you have risk factors for colon cancer. On a yearly basis, your caregiver may provide home test kits to check for hidden blood in the stool. Use of a small camera at the end of a tube, to directly examine the colon (sigmoidoscopy or colonoscopy), can detect the earliest forms of colorectal cancer. Talk to your caregiver about this at age 50, when routine screening begins. Direct examination of the colon should be repeated every 5 to 10 years through age 75, unless early forms of pre-cancerous polyps or small growths are found.   Hepatitis C blood testing is recommended for all people born from 1945 through 1965 and any individual with known risks for hepatitis C.    Practice safe sex. Use condoms and avoid high-risk sexual practices to reduce the spread of sexually transmitted infections (STIs). STIs include gonorrhea, chlamydia, syphilis, trichomonas, herpes, HPV, and human immunodeficiency virus (HIV). Herpes, HIV, and HPV are viral illnesses that have no cure. They can result in disability, cancer, and death. Sexually active women aged 25 and younger should be checked for chlamydia. Older women with new or multiple partners should also be tested for chlamydia. Testing for other STIs is recommended if you are sexually active and at increased risk.   Osteoporosis is a disease in which the bones lose minerals and strength with aging. This can result in serious bone fractures. The risk of osteoporosis can be identified using a bone density scan. Women ages 65 and over and women at risk for fractures or osteoporosis should discuss screening with their caregivers. Ask your caregiver whether you should take a calcium supplement or vitamin D to reduce the rate of osteoporosis.   Menopause can be associated with physical symptoms and risks. Hormone replacement therapy is available to decrease symptoms and risks. You should talk to your caregiver about whether hormone replacement therapy is right for you.   Use sunscreen with sun protection factor (SPF) of 30 or more. Apply sunscreen liberally and repeatedly throughout the day. You should seek shade when your shadow is shorter than you. Protect yourself by wearing long sleeves, pants, a wide-brimmed hat, and sunglasses year round, whenever you are outdoors.   Once a month, do a whole body skin exam, using a mirror to look at the skin on your back. Notify your caregiver of new moles, moles that have irregular borders, moles that are larger than a pencil eraser, or moles that have changed in shape or color.   Stay current with required immunizations.   Influenza. You need a dose every fall (or winter). The composition of  the flu vaccine changes each year, so being vaccinated once is not enough.   Pneumococcal polysaccharide. You need 1 to 2 doses if you smoke cigarettes or if you have certain chronic medical conditions. You need 1 dose at age 65 (or older) if you have never been vaccinated.   Tetanus, diphtheria, pertussis (Tdap, Td). Get 1 dose of   Tdap vaccine if you are younger than age 65, are over 65 and have contact with an infant, are a healthcare worker, are pregnant, or simply want to be protected from whooping cough. After that, you need a Td booster dose every 10 years. Consult your caregiver if you have not had at least 3 tetanus and diphtheria-containing shots sometime in your life or have a deep or dirty wound.   HPV. You need this vaccine if you are a woman age 26 or younger. The vaccine is given in 3 doses over 6 months.   Measles, mumps, rubella (MMR). You need at least 1 dose of MMR if you were born in 1957 or later. You may also need a second dose.   Meningococcal. If you are age 19 to 21 and a first-year college student living in a residence hall, or have one of several medical conditions, you need to get vaccinated against meningococcal disease. You may also need additional booster doses.   Zoster (shingles). If you are age 60 or older, you should get this vaccine.   Varicella (chickenpox). If you have never had chickenpox or you were vaccinated but received only 1 dose, talk to your caregiver to find out if you need this vaccine.   Hepatitis A. You need this vaccine if you have a specific risk factor for hepatitis A virus infection or you simply wish to be protected from this disease. The vaccine is usually given as 2 doses, 6 to 18 months apart.   Hepatitis B. You need this vaccine if you have a specific risk factor for hepatitis B virus infection or you simply wish to be protected from this disease. The vaccine is given in 3 doses, usually over 6 months.  Preventive Services /  Frequency Ages 19 to 39  Blood pressure check.** / Every 1 to 2 years.   Lipid and cholesterol check.** / Every 5 years beginning at age 20.   Clinical breast exam.** / Every 3 years for women in their 20s and 30s.   Pap test.** / Every 2 years from ages 21 through 29. Every 3 years starting at age 30 through age 65 or 70 with a history of 3 consecutive normal Pap tests.   HPV screening.** / Every 3 years from ages 30 through ages 65 to 70 with a history of 3 consecutive normal Pap tests.   Hepatitis C blood test.** / For any individual with known risks for hepatitis C.   Skin self-exam. / Monthly.   Influenza immunization.** / Every year.   Pneumococcal polysaccharide immunization.** / 1 to 2 doses if you smoke cigarettes or if you have certain chronic medical conditions.   Tetanus, diphtheria, pertussis (Tdap, Td) immunization. / A one-time dose of Tdap vaccine. After that, you need a Td booster dose every 10 years.   HPV immunization. / 3 doses over 6 months, if you are 26 and younger.   Measles, mumps, rubella (MMR) immunization. / You need at least 1 dose of MMR if you were born in 1957 or later. You may also need a second dose.   Meningococcal immunization. / 1 dose if you are age 19 to 21 and a first-year college student living in a residence hall, or have one of several medical conditions, you need to get vaccinated against meningococcal disease. You may also need additional booster doses.   Varicella immunization.** / Consult your caregiver.   Hepatitis A immunization.** / Consult your caregiver. 2 doses, 6 to 18 months   apart.   Hepatitis B immunization.** / Consult your caregiver. 3 doses usually over 6 months.  Ages 40 to 64  Blood pressure check.** / Every 1 to 2 years.   Lipid and cholesterol check.** / Every 5 years beginning at age 20.   Clinical breast exam.** / Every year after age 40.   Mammogram.** / Every year beginning at age 40 and continuing for as  long as you are in good health. Consult with your caregiver.   Pap test.** / Every 3 years starting at age 30 through age 65 or 70 with a history of 3 consecutive normal Pap tests.   HPV screening.** / Every 3 years from ages 30 through ages 65 to 70 with a history of 3 consecutive normal Pap tests.   Fecal occult blood test (FOBT) of stool. / Every year beginning at age 50 and continuing until age 75. You may not need to do this test if you get a colonoscopy every 10 years.   Flexible sigmoidoscopy or colonoscopy.** / Every 5 years for a flexible sigmoidoscopy or every 10 years for a colonoscopy beginning at age 50 and continuing until age 75.   Hepatitis C blood test.** / For all people born from 1945 through 1965 and any individual with known risks for hepatitis C.   Skin self-exam. / Monthly.   Influenza immunization.** / Every year.   Pneumococcal polysaccharide immunization.** / 1 to 2 doses if you smoke cigarettes or if you have certain chronic medical conditions.   Tetanus, diphtheria, pertussis (Tdap, Td) immunization.** / A one-time dose of Tdap vaccine. After that, you need a Td booster dose every 10 years.   Measles, mumps, rubella (MMR) immunization. / You need at least 1 dose of MMR if you were born in 1957 or later. You may also need a second dose.   Varicella immunization.** / Consult your caregiver.   Meningococcal immunization.** / Consult your caregiver.   Hepatitis A immunization.** / Consult your caregiver. 2 doses, 6 to 18 months apart.   Hepatitis B immunization.** / Consult your caregiver. 3 doses, usually over 6 months.  Ages 65 and over  Blood pressure check.** / Every 1 to 2 years.   Lipid and cholesterol check.** / Every 5 years beginning at age 20.   Clinical breast exam.** / Every year after age 40.   Mammogram.** / Every year beginning at age 40 and continuing for as long as you are in good health. Consult with your caregiver.   Pap test.** /  Every 3 years starting at age 30 through age 65 or 70 with a 3 consecutive normal Pap tests. Testing can be stopped between 65 and 70 with 3 consecutive normal Pap tests and no abnormal Pap or HPV tests in the past 10 years.   HPV screening.** / Every 3 years from ages 30 through ages 65 or 70 with a history of 3 consecutive normal Pap tests. Testing can be stopped between 65 and 70 with 3 consecutive normal Pap tests and no abnormal Pap or HPV tests in the past 10 years.   Fecal occult blood test (FOBT) of stool. / Every year beginning at age 50 and continuing until age 75. You may not need to do this test if you get a colonoscopy every 10 years.   Flexible sigmoidoscopy or colonoscopy.** / Every 5 years for a flexible sigmoidoscopy or every 10 years for a colonoscopy beginning at age 50 and continuing until age 75.   Hepatitis   C blood test.** / For all people born from 1945 through 1965 and any individual with known risks for hepatitis C.   Osteoporosis screening.** / A one-time screening for women ages 65 and over and women at risk for fractures or osteoporosis.   Skin self-exam. / Monthly.   Influenza immunization.** / Every year.   Pneumococcal polysaccharide immunization.** / 1 dose at age 65 (or older) if you have never been vaccinated.   Tetanus, diphtheria, pertussis (Tdap, Td) immunization. / A one-time dose of Tdap vaccine if you are over 65 and have contact with an infant, are a healthcare worker, or simply want to be protected from whooping cough. After that, you need a Td booster dose every 10 years.   Varicella immunization.** / Consult your caregiver.   Meningococcal immunization.** / Consult your caregiver.   Hepatitis A immunization.** / Consult your caregiver. 2 doses, 6 to 18 months apart.   Hepatitis B immunization.** / Check with your caregiver. 3 doses, usually over 6 months.  ** Family history and personal history of risk and conditions may change your caregiver's  recommendations. Document Released: 11/24/2001 Document Revised: 09/17/2011 Document Reviewed: 02/23/2011 [employer] Patient Information 2012 [employer]. 

## 2012-06-11 IMAGING — RF DG NEPHROSTOGRAM LEFT
10 series · 10 of 10 positions shown · non-contrast
Comparison: 07/24/2010

CLINICAL DATA: Gastrectomy, left distal ureteral tear, status post
sector nephrostomy 07/24/2010

LEFT NEPHROSTOGRAM

[Series 1: run · 1 of 1 slices shown (1 of 10)]
[im 1/1]
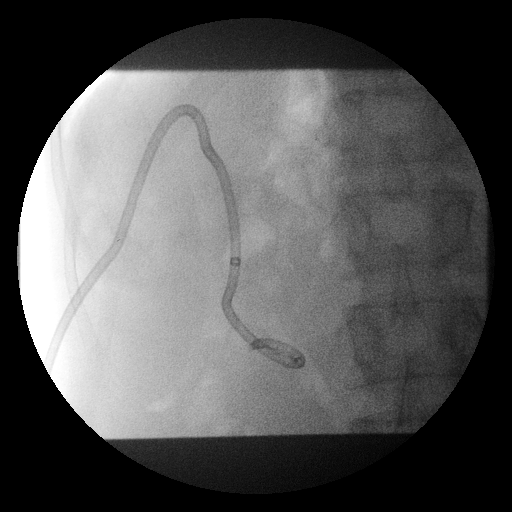

[Series 2: run · 1 of 1 slices shown (2 of 10)]
[im 1/1]
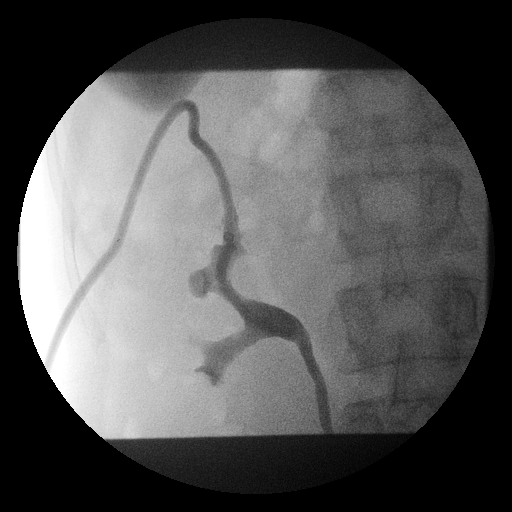

[Series 3: run · 1 of 1 slices shown (3 of 10)]
[im 1/1]
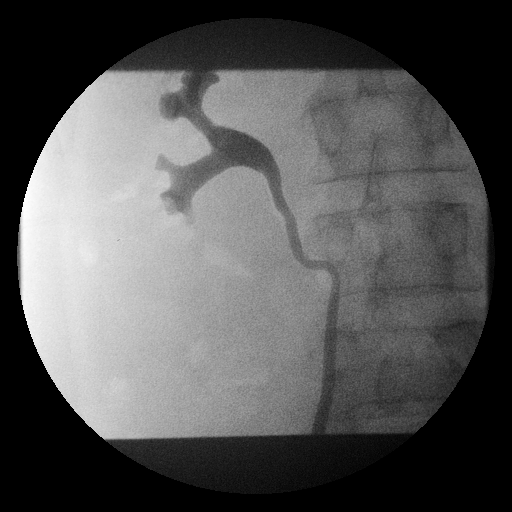

[Series 4: run · 1 of 1 slices shown (4 of 10)]
[im 1/1]
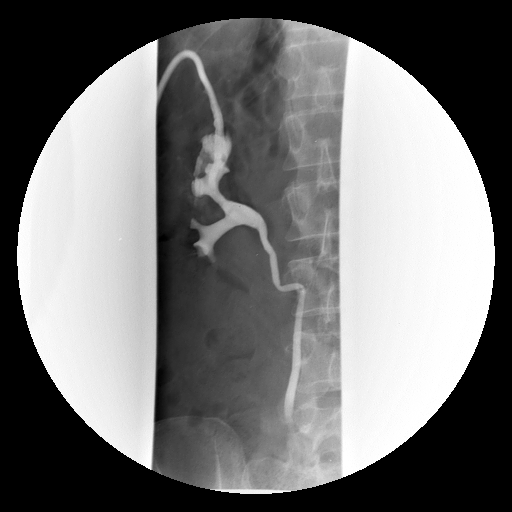

[Series 5: run · 1 of 1 slices shown (5 of 10)]
[im 1/1]
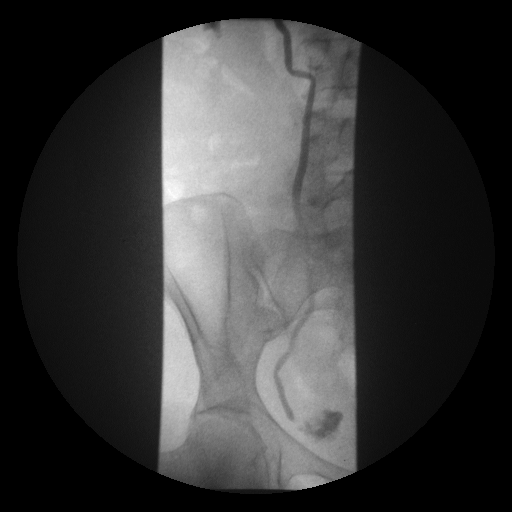

[Series 6: run · 1 of 1 slices shown (6 of 10)]
[im 1/1]
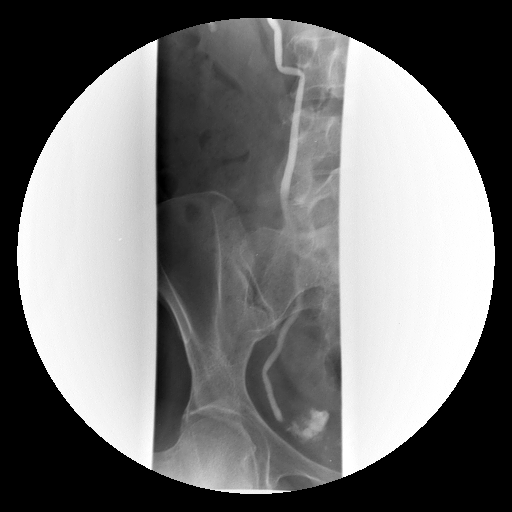

[Series 7: run · 1 of 1 slices shown (7 of 10)]
[im 1/1]
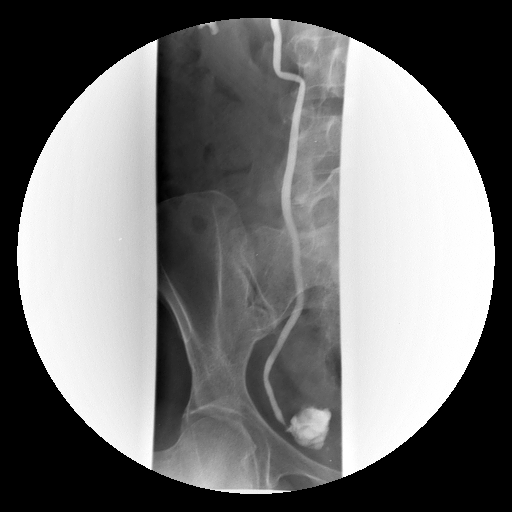

[Series 8: run · 1 of 1 slices shown (8 of 10)]
[im 1/1]
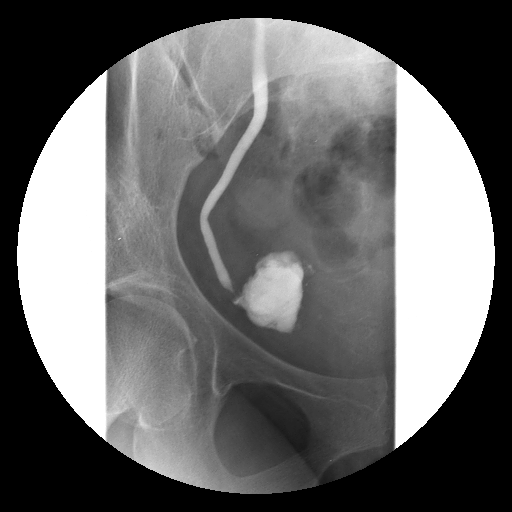

[Series 9: run · 1 of 1 slices shown (9 of 10)]
[im 1/1]
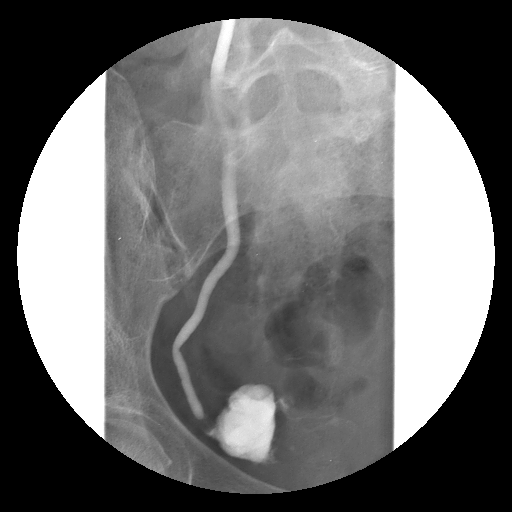

[Series 10: run · 1 of 1 slices shown (10 of 10)]
[im 1/1]
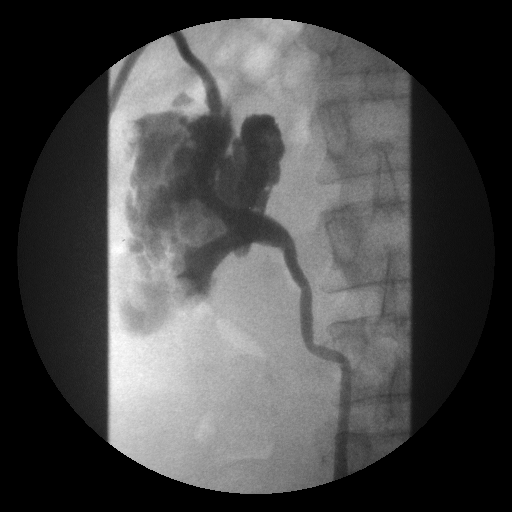

[10 of 10 positions shown; findings below may reference images not displayed]

FINDINGS: Under fluoroscopy, the existing left nephrostomy catheter
was injected with contrast.  Images obtained for antegrade
nephrostogram.

Left nephrostogram:  Left nephrostomy  catheter enters the upper
pole calix.  Retention loop within the renal pelvis which is
decompressed.  Collecting system and renal pelvis normal.  Ureter
is normal in caliber.  Within the pelvis, the distal ureter
demonstrates leakage with extravasation of contrast consistent with
a distal ureteral injury.
IMPRESSION: Left distal ureteral tear/injury with contrast extravasation.

## 2012-06-14 ENCOUNTER — Encounter: Payer: Self-pay | Admitting: Internal Medicine

## 2012-07-06 ENCOUNTER — Encounter: Payer: Self-pay | Admitting: Internal Medicine

## 2012-07-21 ENCOUNTER — Ambulatory Visit (INDEPENDENT_AMBULATORY_CARE_PROVIDER_SITE_OTHER): Payer: Commercial Indemnity | Admitting: Family Medicine

## 2012-07-21 ENCOUNTER — Encounter: Payer: Self-pay | Admitting: Family Medicine

## 2012-07-21 ENCOUNTER — Ambulatory Visit (INDEPENDENT_AMBULATORY_CARE_PROVIDER_SITE_OTHER)
Admission: RE | Admit: 2012-07-21 | Discharge: 2012-07-21 | Disposition: A | Discharge status: Home / Self Care | Payer: Commercial Indemnity | Source: Ambulatory Visit | Attending: Family Medicine | Admitting: Family Medicine

## 2012-07-21 VITALS — BP 140/70 | HR 72 | Temp 97.8°F | Wt 115.0 lb

## 2012-07-21 DIAGNOSIS — M542 Cervicalgia: Secondary | ICD-10-CM

## 2012-07-21 MED ORDER — HYDROCODONE-ACETAMINOPHEN 5-500 MG PO TABS: 1.0000 | ORAL_TABLET | Freq: Three times a day (TID) | ORAL | Status: DC | PRN

## 2012-07-21 MED ORDER — CYCLOBENZAPRINE HCL 5 MG PO TABS: 5.0000 mg | ORAL_TABLET | Freq: Three times a day (TID) | ORAL | Status: AC | PRN

## 2012-07-21 NOTE — Progress Notes (Signed)
SUBJECTIVE:  [name] [name] is a 51 y.o. female who complains of an injury causing neck pain 6  hour(s) ago. The pain is positional with movement of neck without radiation of pain down the arms. Mechanism of injury: 20 pound box fell on her neck.  Symptoms have been constant since that time. Prior history of neck problems: previous osteoarthritis of cervical spine. There is no numbness, tingling, weakness in the arms.  Patient Active Problem List  Diagnosis  . HYPERLIPIDEMIA  . TOBACCO ABUSE  . DEPRESSION  . OSTEOARTHRITIS  . NONSPECIFIC ABN FINDNG RAD&OTH EXAM BILARY TRCT  . MAMMOGRAM, ABNORMAL, LEFT  . Surgical menopause  . Rotator cuff dysfunction  . Headache  . Dizziness  . Neck pain  . Vitiligo   Past Medical History  Diagnosis Date  . Depression   . HLD (hyperlipidemia)   . Tobacco abuse   . OA (osteoarthritis)   . Anxiety disorder   . COPD (chronic obstructive pulmonary disease)   . Fibroids     Uterine  . Vitiligo 1975   Past Surgical History  Procedure Date  . Abdominal hysterectomy 2011  . Ureter surgery 2011  . Mandible surgery 1980  . Hip arthroscopy 2003   History  Substance Use Topics  . Smoking status: Current Every Day Smoker -- 1.5 packs/day for 38 years    Types: Cigarettes  . Smokeless tobacco: Not on file  . Alcohol Use: No   Family History  Problem Relation Age of Onset  . Depression Mother   . Cancer Father   . Depression Father   . Miscarriages / Stillbirths Sister    Allergies  Allergen Reactions  . Codeine     REACTION: Nausea   Current Outpatient Prescriptions on File Prior to Visit  Medication Sig Dispense Refill  . estradiol (VIVELLE-DOT) 0.075 MG/24HR Place 1 patch onto the skin 2 (two) times a week.  8 patch  12  . ibuprofen (ADVIL,MOTRIN) 600 MG tablet Take 600 mg by mouth every 6 (six) hours as needed.      Marland Kitchen LORazepam (ATIVAN) 0.5 MG tablet Take 1 tablet (0.5 mg total) by mouth every 8 (eight) hours as needed.  30 tablet   2  . traMADol (ULTRAM) 50 MG tablet Take 50-100 mg by mouth 2 (two) times daily as needed.         The PMH, PSH, Social History, Family History, Medications, and allergies have been reviewed in [facility], and have been updated if relevant.  OBJECTIVE: BP 140/70  Pulse 72  Temp 97.8 F (36.6 C)  Wt 115 lb (52.164 kg)  Vital signs as noted above. Patient appears to be in mild to moderate pain.  Neck exam: normal C-spine, no tenderness, full ROM without pain, reduced painful C-spine range of motion, normal neurological exam of arms; normal DTR's, motor, sensory exam, negative Lhermitte's sign. X-Ray: ordered, but results not yet available.  ASSESSMENT:  contusion, rule out vertebral injury and cervical strain  PLAN: rest the injured area as much as practical, X-Ray ordered Consider Physical Therapy and XRay studies if not improving.  Call or return to clinic prn if these symptoms worsen or fail to improve as anticipated.

## 2012-07-21 NOTE — Patient Instructions (Addendum)
Good to see you. We will call you with your xray results.  If your pain changes or gets more severe, please go to ER.

## 2012-07-22 ENCOUNTER — Telehealth: Payer: Self-pay | Admitting: *Deleted

## 2012-08-17 NOTE — Telephone Encounter (Signed)
Opened in error

## 2012-12-12 ENCOUNTER — Ambulatory Visit (INDEPENDENT_AMBULATORY_CARE_PROVIDER_SITE_OTHER): Payer: Commercial Indemnity | Admitting: Family Medicine

## 2012-12-12 ENCOUNTER — Encounter: Payer: Self-pay | Admitting: Family Medicine

## 2012-12-12 ENCOUNTER — Ambulatory Visit (INDEPENDENT_AMBULATORY_CARE_PROVIDER_SITE_OTHER)
Admission: RE | Admit: 2012-12-12 | Discharge: 2012-12-12 | Disposition: A | Discharge status: Home / Self Care | Payer: Commercial Indemnity | Source: Ambulatory Visit | Attending: Family Medicine | Admitting: Family Medicine

## 2012-12-12 VITALS — BP 140/90 | HR 68 | Temp 98.0°F | Wt 115.0 lb

## 2012-12-12 DIAGNOSIS — J069 Acute upper respiratory infection, unspecified: Secondary | ICD-10-CM

## 2012-12-12 DIAGNOSIS — M1990 Unspecified osteoarthritis, unspecified site: Secondary | ICD-10-CM

## 2012-12-12 DIAGNOSIS — F419 Anxiety disorder, unspecified: Secondary | ICD-10-CM

## 2012-12-12 DIAGNOSIS — L989 Disorder of the skin and subcutaneous tissue, unspecified: Secondary | ICD-10-CM

## 2012-12-12 MED ORDER — LORAZEPAM 0.5 MG PO TABS: 0.5000 mg | ORAL_TABLET | Freq: Three times a day (TID) | ORAL | Status: DC | PRN

## 2012-12-12 MED ORDER — DICLOFENAC SODIUM 75 MG PO TBEC: 75.0000 mg | DELAYED_RELEASE_TABLET | Freq: Two times a day (BID) | ORAL | Status: DC

## 2012-12-12 MED ORDER — FLUCONAZOLE 150 MG PO TABS: 150.0000 mg | ORAL_TABLET | Freq: Once | ORAL | Status: DC

## 2012-12-12 MED ORDER — CEFUROXIME AXETIL 500 MG PO TABS: 500.0000 mg | ORAL_TABLET | Freq: Two times a day (BID) | ORAL | Status: AC

## 2012-12-12 NOTE — Progress Notes (Signed)
Subjective:    Patient ID: [name] [name], female    DOB: 26-Jul-1961, 52 y.o.   MRN: [id number]  HPI  52 yo with known h/o OA, tobacco abuse here for:  1.  Left ear pain x 2 weeks.  Has had sinus congestion and pressure.  She is a smoker with a chronic cough that is now a little productive. No fever No CP No SOB  2.  Right hip pain- known h/o OA but hip pain is worsening for past 6 months. No LE weakness.  No radiculopathy.  No known injury.  3.  Scalp lesion- has been there for many years, "probably my entire life" but last several months growing in size. Non painful. Not bleeding. Patient Active Problem List  Diagnosis  . HYPERLIPIDEMIA  . TOBACCO ABUSE  . DEPRESSION  . OSTEOARTHRITIS  . NONSPECIFIC ABN FINDNG RAD&OTH EXAM BILARY TRCT  . MAMMOGRAM, ABNORMAL, LEFT  . Surgical menopause  . Rotator cuff dysfunction  . Headache  . Dizziness  . Neck pain  . Vitiligo  . Skin lesion of scalp   Past Medical History  Diagnosis Date  . Depression   . HLD (hyperlipidemia)   . Tobacco abuse   . OA (osteoarthritis)   . Anxiety disorder   . COPD (chronic obstructive pulmonary disease)   . Fibroids     Uterine  . Vitiligo 1975   Past Surgical History  Procedure Laterality Date  . Abdominal hysterectomy  2011  . Ureter surgery  2011  . Mandible surgery  1980  . Hip arthroscopy  2003   History  Substance Use Topics  . Smoking status: Current Every Day Smoker -- 1.50 packs/day for 38 years    Types: Cigarettes  . Smokeless tobacco: Not on file  . Alcohol Use: No   Family History  Problem Relation Age of Onset  . Depression Mother   . Cancer Father   . Depression Father   . Miscarriages / Stillbirths Sister    Allergies  Allergen Reactions  . Codeine     REACTION: Nausea   Current Outpatient Prescriptions on File Prior to Visit  Medication Sig Dispense Refill  . estradiol (VIVELLE-DOT) 0.075 MG/24HR Place 1 patch onto the skin 2 (two) times a week.  8 patch   12  . HYDROcodone-acetaminophen (VICODIN) 5-500 MG per tablet Take 1 tablet by mouth every 8 (eight) hours as needed for pain.  30 tablet  0  . ibuprofen (ADVIL,MOTRIN) 600 MG tablet Take 600 mg by mouth every 6 (six) hours as needed.      . traMADol (ULTRAM) 50 MG tablet Take 50-100 mg by mouth 2 (two) times daily as needed.         No current facility-administered medications on file prior to visit.   The PMH, PSH, Social History, Family History, Medications, and allergies have been reviewed in [facility], and have been updated if relevant.   Review of Systems See HPI No fever No CP No SOB    Objective:   Physical Exam BP 140/90  Pulse 68  Temp(Src) 98 F (36.7 C)  Wt 115 lb (52.164 kg)  BMI 23.21 kg/m2  General:  Well-developed,well-nourished,in no acute distress; alert,appropriate and cooperative throughout examination Head:  normocephalic and atraumatic.   Eyes:  vision grossly intact, pupils equal, pupils round, and pupils reactive to light.   Ears:  Left TM dull Right TM clear Nose:  no external deformity.  +boggy turbinates Mouth:  good dentition.   Neck:  No deformities, masses, or tenderness noted. Lungs: rare scattered exp wheezes, otherwise clear Heart:  Normal rate and regular rhythm. S1 and S2 normal without gallop, murmur, click, rub or other extra sounds. Abdomen:  Bowel sounds positive,abdomen soft and non-tender without masses, organomegaly or hernias noted. Msk:  No deformity or scoliosis noted of thoracic or lumbar spine.   FROM of hips bilaterally, she does have pain with right internal hip rotation Extremities:  No clubbing, cyanosis, edema, or deformity noted with normal full range of motion of all joints.   Neurologic:  alert & oriented X3 and gait normal  Skin:  Large fungating, pink lesion on scalp Psych:  Cognition and judgment appear intact. Alert and cooperative with normal attention span and concentration. No apparent delusions, illusions,  hallucinations    Assessment & Plan:   1. URI  Given duration and progression of symptoms in a smoker, will treat for bacterial bronchitis and otitis with 10 day course of ceftin. Call or return to clinic prn if these symptoms worsen or fail to improve as anticipated.  2. Skin lesion of scalp Now growing in size- refer to derm for removal/biopsy. - Ambulatory referral to Dermatology  3. OSTEOARTHRITIS Deteriorated.  Will order xray of hip today given pain with internal rotation.   Topical voltaren prn. - DG Hip Complete Right; Future

## 2012-12-12 NOTE — Patient Instructions (Addendum)
Good to see you. Please stop by to see Shirlee Limerick on your way out to set up your dermatology referral.  Take Ceftin 500 mg twice daily x 10 days.  We will call you with your xray results.

## 2012-12-13 ENCOUNTER — Telehealth: Payer: Self-pay | Admitting: Family Medicine

## 2012-12-13 ENCOUNTER — Other Ambulatory Visit: Payer: Self-pay | Admitting: Family Medicine

## 2012-12-13 NOTE — Telephone Encounter (Signed)
Advised patient.  She declines referral at this time, will try the voltaren and see how that works.  Lorazepam called to [facility]- was not sent yesterday.

## 2012-12-13 NOTE — Telephone Encounter (Signed)
Pt had OV yesterday.  She is requesting medication for arthritis pain and for depression.  States that Dr. Dayton Martes called her with xray results last night but she forgot to ask her about it.

## 2012-12-13 NOTE — Telephone Encounter (Signed)
There is nothing more that I would recommend for arthritis pain other than what she is taking-voltaren, tramadol, and tylenol.  We could refer her to ortho if she would like.  Depression is a complicated issue and I would like for her to come back in to discuss this.

## 2012-12-13 NOTE — Telephone Encounter (Signed)
Medicine called to [facility]. 

## 2013-04-18 ENCOUNTER — Ambulatory Visit (INDEPENDENT_AMBULATORY_CARE_PROVIDER_SITE_OTHER): Payer: Commercial Indemnity | Admitting: Family Medicine

## 2013-04-18 ENCOUNTER — Encounter: Payer: Self-pay | Admitting: Family Medicine

## 2013-04-18 VITALS — BP 122/72 | HR 68 | Temp 97.9°F | Wt 112.8 lb

## 2013-04-18 DIAGNOSIS — T679XXA Effect of heat and light, unspecified, initial encounter: Secondary | ICD-10-CM | POA: Insufficient documentation

## 2013-04-18 DIAGNOSIS — H6980 Other specified disorders of Eustachian tube, unspecified ear: Secondary | ICD-10-CM

## 2013-04-18 DIAGNOSIS — H6982 Other specified disorders of Eustachian tube, left ear: Secondary | ICD-10-CM

## 2013-04-18 NOTE — Assessment & Plan Note (Signed)
Several days out with benign exam.  Would hold off on w/u now.  Would rest, inc fluids, heat caution and f/u prn.  Noted to be nontoxic, normal mentation and CN 2-12 wnl B, S/S/DTR wnl x4.

## 2013-04-18 NOTE — Patient Instructions (Addendum)
Use nasal saline and stop smoking.  If you continue to have left ear troubles, then let Dr. Dayton Martes know.  Get some rest and drink enough fluids to keep your urine clear or light colored.  Let Dr. Dayton Martes know if you don't get better.  You should gradually improve.

## 2013-04-18 NOTE — Assessment & Plan Note (Signed)
D/w pt re: smoking cessation, valsalva, nasal saline.  F/u with PCP if not improved.  No indication for abx.

## 2013-04-18 NOTE — Progress Notes (Signed)
"  I think I have an ear infection."  L ear pain, sore.  Going on for about 4-5 days.  No fevers.  Minimal rhinorrhea, no ST.  Cough at baseline.   Saturday afternoon she thought she got overheated, had prolonged fasting that day, got into [facility] and drank some water.  Then vomited.  Fatigued since then.  Some nausea and aches since then.  No syncope.  No focal neuro changes.  No vomiting since Saturday.  No vertigo.   She is slightly and briefly lightheaded with standing.    Meds, vitals, and allergies reviewed.   ROS: See HPI.  Otherwise, noncontributory.  nad ncat Tm wnl, no erythema B, canals B wnl Mmm Nasal exam wnl Neck supple, no LA rrr ctab abd soft, normal BS Vitiligo noted on exam.  Sinuses not ttp x4 CN 2-12 wnl B, S/S/DTR wnl x4 Unable to pop L TM with valsalva.  Air>bone conduction on teasting.

## 2013-04-20 ENCOUNTER — Telehealth: Payer: Self-pay

## 2013-04-20 ENCOUNTER — Telehealth: Payer: Self-pay | Admitting: *Deleted

## 2013-04-20 DIAGNOSIS — Z780 Asymptomatic menopausal state: Secondary | ICD-10-CM

## 2013-04-20 MED ORDER — ESTRADIOL 0.075 MG/24HR TD PTTW: 1.0000 | MEDICATED_PATCH | TRANSDERMAL | Status: DC

## 2013-04-20 NOTE — Telephone Encounter (Signed)
Pt called in to ask for refill of Vivelle patch - Delphina Cahill called 1 refill into pharm and made pt appt for follow up   Call Documentation    Harlene Salts at 04/20/2013 10:27 AM    Status: Signed             Patient called regarding her estradiol patches. She didn't realize it was time for her annual. Her rx expired and has made an appointment with you for her annual. I did give her one refill to get her though to her [place] pharmacy. I gave her 8 patches with 0 refills. Please addendum this to her meds please, thanks!

## 2013-04-20 NOTE — Telephone Encounter (Signed)
Patient called regarding her estradiol patches. She didn't realize it was time for her annual. Her rx expired and has made an appointment with you for her annual. I did give her one refill to get her though to her [place] pharmacy. I gave her 8 patches with 0 refills. Please addendum this to her meds please, thanks!

## 2013-04-24 ENCOUNTER — Ambulatory Visit: Payer: Commercial Indemnity | Admitting: Family Medicine

## 2013-07-19 ENCOUNTER — Ambulatory Visit (INDEPENDENT_AMBULATORY_CARE_PROVIDER_SITE_OTHER): Payer: Commercial Indemnity | Admitting: Family Medicine

## 2013-07-19 ENCOUNTER — Encounter: Payer: Self-pay | Admitting: Family Medicine

## 2013-07-19 VITALS — BP 116/70 | HR 77 | Temp 98.2°F | Ht 59.0 in | Wt 118.0 lb

## 2013-07-19 DIAGNOSIS — M25519 Pain in unspecified shoulder: Secondary | ICD-10-CM

## 2013-07-19 DIAGNOSIS — M7710 Lateral epicondylitis, unspecified elbow: Secondary | ICD-10-CM

## 2013-07-19 DIAGNOSIS — M7701 Medial epicondylitis, right elbow: Secondary | ICD-10-CM

## 2013-07-19 DIAGNOSIS — B852 Pediculosis, unspecified: Secondary | ICD-10-CM

## 2013-07-19 DIAGNOSIS — M25511 Pain in right shoulder: Secondary | ICD-10-CM

## 2013-07-19 DIAGNOSIS — M7711 Lateral epicondylitis, right elbow: Secondary | ICD-10-CM

## 2013-07-19 DIAGNOSIS — F329 Major depressive disorder, single episode, unspecified: Secondary | ICD-10-CM

## 2013-07-19 DIAGNOSIS — M7700 Medial epicondylitis, unspecified elbow: Secondary | ICD-10-CM

## 2013-07-19 MED ORDER — PERMETHRIN 1 % EX LOTN: TOPICAL_LOTION | Freq: Once | CUTANEOUS | Status: DC

## 2013-07-19 MED ORDER — LORAZEPAM 0.5 MG PO TABS: ORAL_TABLET | ORAL | Status: DC

## 2013-07-19 MED ORDER — MALATHION 0.5 % EX LOTN: TOPICAL_LOTION | Freq: Once | CUTANEOUS | Status: DC

## 2013-07-19 MED ORDER — DICLOFENAC SODIUM 75 MG PO TBEC: 75.0000 mg | DELAYED_RELEASE_TABLET | Freq: Two times a day (BID) | ORAL | Status: DC

## 2013-07-19 NOTE — Progress Notes (Signed)
Nature conservation officer at [facility] [address] [place] [address] Phone: [phone] Fax: [phone]  Date:  07/19/2013   Name:  [name] [name]   DOB:  02-15-1961   MRN:  [id number] Gender: female Age: 52 y.o.  Primary Physician:  Ruthe Mannan, MD   Chief Complaint: Arm Pain   History of Present Illness:  [name] [name] is a 52 y.o. pleasant patient who presents with the following:  R arm from shoulder down and works at [employer] and cannot pick up a coffee pot.  R elbow, R shoulder.   Patient presents with lateral and medial elbow pain.  Length of symptoms: 2 mo Hand effected: R  Patient describes a dull ache on the lateral elbow and medial. There is some translation in the proximal forearm and in the distal upper arm. It is painful to lift with the hand facing down and to lift with the thumb in an upright position. Supination is painful. Patient points to the lateral epicondyle as the point of maximal tenderness near ECRB.  No trauma.   No prior fractures or operative interventions in the effective hand. Prior PT or HEP: none  Denies numbness or tingling.  Hand of dominance: R   The patient noted above presents with shoulder pain that has been ongoing for 2 mo.  there is no history of trauma or accident. The patient denies neck pain or radicular symptoms. Denies dislocation, subluxation, separation of the shoulder. The patient does complain of pain in the overhead plane when lifting coffee pots.  Medications Tried: advil Ice or Heat: yes Tried PT: No  Prior shoulder Injury: reports prior shoulder RTC injury - unclear side Prior fracture: No  She also has lice and failed otc NIX x 2   Patient Active Problem List   Diagnosis Date Noted  . ETD (eustachian tube dysfunction) 04/18/2013  . Heat effect 04/18/2013  . Skin lesion of scalp 12/12/2012  . Vitiligo 04/12/2012  . Neck pain 03/21/2012  . Surgical menopause 06/16/2011  . NONSPECIFIC ABN  FINDNG RAD&OTH EXAM BILARY TRCT 07/11/2010  . MAMMOGRAM, ABNORMAL, LEFT 02/17/2010  . HYPERLIPIDEMIA 02/06/2010  . TOBACCO ABUSE 02/06/2010  . DEPRESSION 02/06/2010  . OSTEOARTHRITIS 02/06/2010    Past Medical History  Diagnosis Date  . Depression   . HLD (hyperlipidemia)   . Tobacco abuse   . OA (osteoarthritis)   . Anxiety disorder   . COPD (chronic obstructive pulmonary disease)   . Fibroids     Uterine  . Vitiligo 1975    Past Surgical History  Procedure Laterality Date  . Abdominal hysterectomy  2011  . Ureter surgery  2011  . Mandible surgery  1980  . Hip arthroscopy  2003    History   Social History  . Marital Status: Married    Spouse Name: N/A    Number of Children: 3  . 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 Smoking status: Current Every Day Smoker -- 1.50 packs/day for 38 years    Types: Cigarettes  . Smokeless tobacco: Never Used  . Alcohol Use: No  . Drug Use: Yes     Comment: Marijuana  . Sexual Activity: Not on file   Other Topics Concern  . Not on file   Social History Narrative   Lives with husband in [place]      Smokes 1 1/2 PPD x 38 years  3 children      Manager at [employer]      Daily caffeine use: 6-8      No alcohol      Illicit drug use-marijuana    Family History  Problem Relation Age of Onset  . Depression Mother   . Cancer Father   . Depression Father   . Miscarriages / Stillbirths Sister     Allergies  Allergen Reactions  . Codeine     REACTION: Nausea    Medication list has been reviewed and updated.  Outpatient Prescriptions Prior to Visit  Medication Sig Dispense Refill  . estradiol (VIVELLE-DOT) 0.075 MG/24HR Place 1 patch onto the skin 2 (two) times a week.  8 patch  0  . ibuprofen (ADVIL,MOTRIN) 600 MG tablet Take 600 mg by mouth every 6 (six) hours as needed.      Marland Kitchen LORazepam (ATIVAN) 0.5 MG tablet TAKE ONE TABLET BY MOUTH EVERY  8 HOURS AS NEEDED  30 tablet  0  . traMADol (ULTRAM) 50 MG tablet Take 50-100 mg by mouth 2 (two) times daily as needed.         No facility-administered medications prior to visit.    Review of Systems:   GEN: No fevers, chills. Nontoxic. Primarily MSK c/o today. MSK: Detailed in the HPI GI: tolerating PO intake without difficulty Neuro: No numbness, parasthesias, or tingling associated. Otherwise the pertinent positives of the ROS are noted above.    Physical Examination: BP 116/70  Pulse 77  Temp(Src) 98.2 F (36.8 C) (Oral)  Ht 4\' 11"  (1.499 m)  Wt 118 lb (53.524 kg)  BMI 23.82 kg/m2  Ideal Body Weight: Weight in (lb) to have BMI = 25: 123.5   GEN: WDWN, NAD, Non-toxic, Alert & Oriented x 3 HEENT: Atraumatic, Normocephalic.  Ears and Nose: No external deformity. EXTR: No clubbing/cyanosis/edema NEURO: Normal gait.  PSYCH: Normally interactive. Conversant. Not depressed or anxious appearing.  Calm demeanor.   Elbow: R Ecchymosis or edema: neg ROM: full flexion, extension, pronation, supination - some pain terminally Shoulder ROM: Full Flexion: 5/5 Extension: 5/5, PAINFUL Supination: 5/5, PAINFUL Pronation: 5/5, painful Wrist ext: 5/5 Wrist flexion: 5/5 No gross bony abnormality Varus and Valgus stress: stable ECRB tenderness: YES, TTP Medial epicondyle: TTP and pronation painful Lateral epicondyle, resisted wrist extension from wrist full pronation and flexion: PAINFUL grip: 5/5  sensation intact Tinel's, Elbow: negative   Shoulder: RIGHT Inspection: No muscle wasting or winging Ecchymosis/edema: neg  AC joint, scapula, clavicle: mild ttp Cervical spine: NT, full ROM Spurling's: neg Abduction: full, 5/5, mildly ttp arc of motion Flexion: full, 5/5 IR, full, lift-off: 5/5 ER at neutral: full, 5/5 AC crossover: neg Neer: pos Hawkins: pos, mild Drop Test: neg Empty Can: pos Supraspinatus insertion: mild-mod T Bicipital groove: NT Speed's:  neg Yergason's: neg Sulcus sign: neg Scapular dyskinesis: none C5-T1 intact  Neuro: Sensation intact Grip 5/5   Assessment and Plan:  Lateral epicondylitis (tennis elbow), right: Lateral Epicondylitis: Elbow anatomy was reviewed, and tendinopathy was explained.  Pt. given a formal rehab program on elbow rehabiliation.  Series of concentric and eccentric exercises should be done starting with no weight, work up to 1 lb, hammer, etc. Use counterforce strap if working or using hands - she already has one  Formal PT would be beneficial if continued pain Emphasized stretching an cross-friction massage Emphasized proper palms up lifting biomechanics to unload ECRB  Lateral Epicondylitis Injection, RIGHT Verbal consent was obtained from the patient. Risks, benefits, and  alternatives were discussed. Potential complications including loss of pigment, atrophy, and rare risk of infection were discussed. Prepped with Chloraprep and Ethyl Chloride used for anesthesia. Under sterile conditions, the patient was injected at the point of maximal tenderness at the ECRB tendon with 1 cc of Lidocaine 1% and 1/2 cc of Depo-Medrol 40 mg. Decreased pain after injection. No complications.  Needle size: 22 gauge 1 1/2 inch   Medial epicondylitis of elbow, right: same rehab as above  Medial Epicondylitis Injection, RIGHT. Distinct and separate tendon insertional injection Verbal consent was obtained from the patient. Risks (including rare risk of infection, potential risk of atrophy and potential risk of skin lightening. Also, there is potential risk of ulnar nerve block and a rare risk of nerve damage), benefits, and alternatives were discussed. Potential complications including loss of pigment and atrophy were discussed. Prepped with Chloraprep and Ethyl Chloride used for anesthesia. Under sterile conditions, the patient was injected at the point of maximal tenderness 1 cm distal to medial epicondyle with 1 cc  of Lidocaine 1% and 1/2 cc of Depo-Medrol 40 mg with peppering. Decreased pain after injection. No complications.  Needle size: 22 gauge 1 1/2 inch   Pain in joint, shoulder region, right ROM and basic shoulder rehab  Lice infested hair Malathion.  DEPRESSION  refilled ativan at her request.   Orders Today:  No orders of the defined types were placed in this encounter.    Updated Medication List: (Includes new medications, updates to list, dose adjustments) Meds ordered this encounter  Medications  . diclofenac (VOLTAREN) 75 MG EC tablet    Sig: Take 1 tablet (75 mg total) by mouth 2 (two) times daily.    Dispense:  60 tablet    Refill:  3  . DISCONTD: permethrin (PERMETHRIN LICE TREATMENT) 1 % lotion    Sig: Apply topically once. Shampoo, rinse and towel dry hair, saturate hair and scalp with permethrin. Rinse after 10 min; repeat in 1 week if needed    Dispense:  59 mL    Refill:  0  . malathion (OVIDE) 0.5 % lotion    Sig: Apply topically once. Sprinkle lotion on dry hair and rub gently until the scalp is thoroughly moistened. Allow to dry naturally and leave uncovered. Repeat in 7 days.    Dispense:  59 mL    Refill:  0  . LORazepam (ATIVAN) 0.5 MG tablet    Sig: TAKE ONE TABLET BY MOUTH EVERY 8 HOURS AS NEEDED    Dispense:  30 tablet    Refill:  0    Medications Discontinued: Medications Discontinued During This Encounter  Medication Reason  . LORazepam (ATIVAN) 0.5 MG tablet Reorder  . permethrin (PERMETHRIN LICE TREATMENT) 1 % lotion       Signed,  Jakeia Carreras T. Dao Mearns, MD

## 2013-08-21 ENCOUNTER — Other Ambulatory Visit: Payer: Self-pay | Admitting: Internal Medicine

## 2013-08-21 ENCOUNTER — Other Ambulatory Visit: Payer: Commercial Indemnity

## 2013-08-21 DIAGNOSIS — Z0000 Encounter for general adult medical examination without abnormal findings: Secondary | ICD-10-CM

## 2013-08-22 ENCOUNTER — Encounter: Payer: Self-pay | Admitting: Radiology

## 2013-08-22 ENCOUNTER — Other Ambulatory Visit (INDEPENDENT_AMBULATORY_CARE_PROVIDER_SITE_OTHER): Payer: Commercial Indemnity

## 2013-08-22 DIAGNOSIS — Z0000 Encounter for general adult medical examination without abnormal findings: Secondary | ICD-10-CM

## 2013-08-22 DIAGNOSIS — E785 Hyperlipidemia, unspecified: Secondary | ICD-10-CM

## 2013-08-22 LAB — CBC
HCT: 46.4 % — ABNORMAL HIGH (ref 36.0–46.0)
Hemoglobin: 15.9 g/dL — ABNORMAL HIGH (ref 12.0–15.0)
MCV: 89.9 fl (ref 78.0–100.0)
RBC: 5.16 Mil/uL — ABNORMAL HIGH (ref 3.87–5.11)
RDW: 13.3 % (ref 11.5–14.6)
WBC: 7.2 10*3/uL (ref 4.5–10.5)

## 2013-08-22 LAB — LIPID PANEL
Cholesterol: 222 mg/dL — ABNORMAL HIGH (ref 0–200)
HDL: 47.2 mg/dL (ref >39.00)
Total CHOL/HDL Ratio: 5
VLDL: 26.4 mg/dL (ref 0.0–40.0)

## 2013-08-22 LAB — COMPREHENSIVE METABOLIC PANEL
Alkaline Phosphatase: 58 U/L (ref 39–117)
BUN: 10 mg/dL (ref 6–23)
CO2: 28 mEq/L (ref 19–32)
Creatinine, Ser: 0.8 mg/dL (ref 0.4–1.2)
GFR: 76.67 mL/min (ref >60.00)
Glucose, Bld: 87 mg/dL (ref 70–99)
Total Bilirubin: 0.7 mg/dL (ref 0.3–1.2)

## 2013-08-23 ENCOUNTER — Encounter: Payer: Self-pay | Admitting: Internal Medicine

## 2013-08-23 ENCOUNTER — Ambulatory Visit (INDEPENDENT_AMBULATORY_CARE_PROVIDER_SITE_OTHER): Payer: Commercial Indemnity | Admitting: Internal Medicine

## 2013-08-23 VITALS — BP 102/70 | HR 72 | Temp 98.0°F | Ht 59.0 in | Wt 115.8 lb

## 2013-08-23 DIAGNOSIS — Z0000 Encounter for general adult medical examination without abnormal findings: Secondary | ICD-10-CM

## 2013-08-23 DIAGNOSIS — Z23 Encounter for immunization: Secondary | ICD-10-CM

## 2013-08-23 DIAGNOSIS — F329 Major depressive disorder, single episode, unspecified: Secondary | ICD-10-CM

## 2013-08-23 DIAGNOSIS — F32A Depression, unspecified: Secondary | ICD-10-CM

## 2013-08-23 MED ORDER — SERTRALINE HCL 50 MG PO TABS: 50.0000 mg | ORAL_TABLET | Freq: Every day | ORAL | Status: DC

## 2013-08-23 NOTE — Progress Notes (Signed)
Pre-visit discussion using our clinic review tool. No additional management support is needed unless otherwise documented below in the visit note.  

## 2013-08-23 NOTE — Assessment & Plan Note (Signed)
Previously used wellbutrin- too expensive Would like to try a different, cheaper med RX for Zoloft 50 mg daily Call me in 1 month to let me know how you are doing Support offered today

## 2013-08-23 NOTE — Addendum Note (Signed)
Addended by: Criselda Peaches B on: 08/23/2013 03:09 PM   Modules accepted: Orders

## 2013-08-23 NOTE — Progress Notes (Signed)
Subjective:    Patient ID: [name] [name], female    DOB: 1960/11/20, 52 y.o.   MRN: [id number]  HPI  Pt presents to the clinic for her annual exam. She has no concerns today.  Flu: never, will get one today Tetanus: 2011 Pap Smear: more than 4 years ago Mammogram: more than 2 years ago Eye doctor: as needed Dentist: as needed Colonoscopy: never  Review of Systems      Past Medical History  Diagnosis Date  . Depression   . HLD (hyperlipidemia)   . Tobacco abuse   . OA (osteoarthritis)   . Anxiety disorder   . COPD (chronic obstructive pulmonary disease)   . Fibroids     Uterine  . Vitiligo 1975    Current Outpatient Prescriptions  Medication Sig Dispense Refill  . diclofenac (VOLTAREN) 75 MG EC tablet Take 1 tablet (75 mg total) by mouth 2 (two) times daily.  60 tablet  3  . estradiol (VIVELLE-DOT) 0.075 MG/24HR Place 1 patch onto the skin 2 (two) times a week.  8 patch  0  . ibuprofen (ADVIL,MOTRIN) 600 MG tablet Take 600 mg by mouth every 6 (six) hours as needed.      Marland Kitchen LORazepam (ATIVAN) 0.5 MG tablet TAKE ONE TABLET BY MOUTH EVERY 8 HOURS AS NEEDED  30 tablet  0   No current facility-administered medications for this visit.    Allergies  Allergen Reactions  . Codeine     REACTION: Nausea    Family History  Problem Relation Age of Onset  . Depression Mother   . Cancer Father   . Depression Father   . Miscarriages / Stillbirths Sister     History   Social History  . Marital Status: Married    Spouse Name: N/A    Number of Children: 3  . 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 Smoking status: Current Every Day Smoker -- 1.50 packs/day for 38 years    Types: Cigarettes  . Smokeless tobacco: Never Used  . Alcohol Use: No  . Drug Use: Yes     Comment: Marijuana  . Sexual Activity: Not on file   Other Topics Concern  . 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  HEENT: Denies eye pain, eye redness, ear pain, ringing in the ears, wax buildup, runny nose, nasal congestion, bloody nose, or sore throat. Respiratory: Denies difficulty breathing, shortness of breath, cough or sputum production.   Cardiovascular: Denies chest pain, chest tightness, palpitations or swelling in the hands or feet.  Gastrointestinal: Denies abdominal pain, bloating, constipation, diarrhea or blood in the stool.  GU: Denies urgency, frequency, pain with urination, burning sensation, blood in urine, odor or discharge. Musculoskeletal: Denies decrease in range of motion, difficulty with gait, muscle pain or joint pain and swelling.  Skin: Denies redness, rashes, lesions or ulcercations.  Neurological: Denies dizziness, difficulty with memory, difficulty with speech or problems with balance and coordination.  Psych: Pt reports depression and anxiety, Denies SI/HI.  No other specific complaints in a complete review of systems (except as listed in HPI above).  Objective:   Physical Exam  BP 102/70  Pulse 72  Temp(Src) 98 F (36.7 C) (Oral)  Ht 4\' 11"  (1.499 m)  Wt 115 lb 12 oz (52.504 kg)  BMI 23.37 kg/m2  SpO2 96% Wt Readings from Last 3 Encounters:  08/23/13 115 lb 12 oz (52.504 kg)  07/19/13 118 lb (53.524 kg)  04/18/13 112 lb 12 oz (51.143 kg)    General: Appears her stated age, well developed, well nourished in NAD. Skin: Warm, dry and intact. No rashes, lesions or ulcerations noted. HEENT: Head: normal shape and size; Eyes: sclera white, no icterus, conjunctiva pink, PERRLA and EOMs intact; Ears: Tm's gray and intact, normal light reflex; Nose: mucosa pink and moist, septum midline; Throat/Mouth: Teeth  present, mucosa pink and moist, no exudate, lesions or ulcerations noted.  Neck: Normal range of motion. Neck supple, trachea midline. No massses, lumps or thyromegaly present.  Cardiovascular: Normal rate and rhythm. S1,S2 noted.  No murmur, rubs or gallops noted. No JVD or BLE edema. No carotid bruits noted. Pulmonary/Chest: Normal effort and positive vesicular breath sounds. No respiratory distress. No wheezes, rales or ronchi noted.  Abdomen: Soft and nontender. Normal bowel sounds, no bruits noted. No distention or masses noted. Liver, spleen and kidneys non palpable. Musculoskeletal: Normal range of motion. No signs of joint swelling. No difficulty with gait.  Neurological: Alert and oriented. Cranial nerves II-XII intact. Coordination normal. +DTRs bilaterally. Psychiatric: Mood flat and affect normal. Behavior is normal. Judgment and thought content normal.     BMET    Component Value Date/Time   NA 140 08/22/2013 1154   K 4.4 08/22/2013 1154   CL 104 08/22/2013 1154   CO2 28 08/22/2013 1154   GLUCOSE 87 08/22/2013 1154   BUN 10 08/22/2013 1154   CREATININE 0.8 08/22/2013 1154   CALCIUM 9.6 08/22/2013 1154   GFRNONAA >60 09/15/2010 0450   GFRAA  Value: >60        The eGFR has been calculated using the MDRD equation. This calculation has not been validated in all clinical situations. eGFR's persistently <60 mL/min signify possible Chronic Kidney Disease. 09/15/2010 0450    Lipid Panel     Component Value Date/Time   CHOL 222* 08/22/2013 1154   TRIG 132.0 08/22/2013 1154   HDL 47.20 08/22/2013 1154   CHOLHDL 5 08/22/2013 1154   VLDL 26.4 08/22/2013 1154   LDLCALC 137* 02/06/2010 1422    CBC    Component Value Date/Time   WBC 7.2 08/22/2013 1154   RBC 5.16* 08/22/2013 1154   HGB 15.9* 08/22/2013 1154   HCT 46.4* 08/22/2013 1154   PLT 164.0 08/22/2013 1154   MCV 89.9 08/22/2013 1154   MCH 29.2 09/15/2010 0450   MCHC 34.3 08/22/2013 1154   RDW 13.3 08/22/2013 1154    LYMPHSABS 1.7 03/21/2012 1517   MONOABS 0.6 03/21/2012 1517   EOSABS 0.1 03/21/2012 1517   BASOSABS 0.1 03/21/2012 1517    Hgb A1C No results found for this basename: HGBA1C         Assessment & Plan:   Preventative Health Maintenance:  Labs reviewed today: elevated LDL, advised pt to cut out fried foods Advised pt to quit smoking- she declines today Form filled out as requested Flu shot today Pt declines appt for pap, mammo and colon screening  RTC in 1 year in sooner if needed

## 2013-08-23 NOTE — Patient Instructions (Signed)

## 2013-09-22 ENCOUNTER — Encounter: Payer: Self-pay | Admitting: Family Medicine

## 2013-10-16 ENCOUNTER — Telehealth: Payer: Self-pay

## 2013-10-16 NOTE — Telephone Encounter (Signed)
Pt left v/m; pt was started on antidepressant last seen 08/23/13 and pt was to call and report how doing; pt said antidepressant made pt more irritable and "mean" Pt has stopped med.Please advise.

## 2013-10-16 NOTE — Telephone Encounter (Signed)
Ok if she would like to try another medication she just needs to let me know

## 2013-10-16 NOTE — Telephone Encounter (Signed)
Pt states that she would like to try something else as this medication seemed not to help at all--please advise--can call in for pt to [facility]

## 2013-10-16 NOTE — Telephone Encounter (Signed)
Rx sent through e-scribe and added to medication list Left message on voicemail for pt to return call so she can be notified

## 2013-10-16 NOTE — Telephone Encounter (Signed)
Can try citalopram 20 mg daily # 30 0 refills

## 2013-12-12 ENCOUNTER — Encounter: Payer: Self-pay | Admitting: Internal Medicine

## 2013-12-12 ENCOUNTER — Telehealth: Payer: Self-pay | Admitting: Family Medicine

## 2013-12-12 ENCOUNTER — Ambulatory Visit (INDEPENDENT_AMBULATORY_CARE_PROVIDER_SITE_OTHER): Payer: Commercial Indemnity | Admitting: Internal Medicine

## 2013-12-12 VITALS — BP 134/94 | HR 82 | Temp 98.2°F | Wt 113.5 lb

## 2013-12-12 DIAGNOSIS — T3695XA Adverse effect of unspecified systemic antibiotic, initial encounter: Secondary | ICD-10-CM

## 2013-12-12 DIAGNOSIS — B379 Candidiasis, unspecified: Secondary | ICD-10-CM

## 2013-12-12 DIAGNOSIS — J019 Acute sinusitis, unspecified: Secondary | ICD-10-CM

## 2013-12-12 MED ORDER — FLUCONAZOLE 150 MG PO TABS: 150.0000 mg | ORAL_TABLET | Freq: Once | ORAL | Status: DC

## 2013-12-12 MED ORDER — AMOXICILLIN 875 MG PO TABS: 875.0000 mg | ORAL_TABLET | Freq: Two times a day (BID) | ORAL | Status: DC

## 2013-12-12 MED ORDER — FLUTICASONE PROPIONATE 50 MCG/ACT NA SUSP: 2.0000 | Freq: Every day | NASAL | Status: DC

## 2013-12-12 NOTE — Progress Notes (Signed)
Pre visit review using our clinic review tool, if applicable. No additional management support is needed unless otherwise documented below in the visit note. 

## 2013-12-12 NOTE — Addendum Note (Signed)
Addended by: Jearld Fenton on: 12/12/2013 02:11 PM   Modules accepted: Orders

## 2013-12-12 NOTE — Progress Notes (Signed)
HPI  Pt presents to the clinic today with c/o sinus pain and pressure R>L, sneezing, runny nose and nasal congestion. This started about 2 weeks ago. She also c/o a productive cough with green sputum. She has taken and OTC sinus/allergy medication and mucinex without much relief. She has a history of COPD. She is a current smoker.  Review of Systems    Past Medical History  Diagnosis Date  . Depression   . HLD (hyperlipidemia)   . Tobacco abuse   . OA (osteoarthritis)   . Anxiety disorder   . COPD (chronic obstructive pulmonary disease)   . Fibroids     Uterine  . Vitiligo 1975    Family History  Problem Relation Age of Onset  . Depression Mother   . Cancer Father   . Depression Father   . Miscarriages / Stillbirths Sister     History   Social History  . Marital Status: Married    Spouse Name: N/A    Number of Children: 3  . 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 Smoking status: Current Every Day Smoker -- 1.50 packs/day for 38 years    Types: Cigarettes  . Smokeless tobacco: Never Used  . Alcohol Use: No  . Drug Use: Yes     Comment: Marijuana  . Sexual Activity: Not Currently   Other Topics Concern  . Not on file   Social History Narrative   Lives with husband in [place] 1 1/2 PPD x 38 years      3 Engineer, civil (consulting) at [employer]      Daily caffeine use: 6-8      No alcohol      Illicit drug use-marijuana    Allergies  Allergen Reactions  . Codeine     REACTION: Nausea     Constitutional: Positive headache, fatigue. Denies fever or abrupt weight changes.  HEENT:  Positive facial pain, runny nose, nasal congestion and sore throat. Denies eye redness, ear pain, ringing in the ears, wax buildup,or bloody nose. Respiratory: Positive cough. Denies difficulty breathing or shortness of breath.  Cardiovascular: Denies chest pain, chest tightness, palpitations or  swelling in the hands or feet.   No other specific complaints in a complete review of systems (except as listed in HPI above).  Objective:    General: Appears her stated age, well developed, well nourished in NAD. HEENT: Head: normal shape and size, right maxillary sinus tenderness noted; Eyes: sclera white, no icterus, conjunctiva pink, PERRLA and EOMs intact; Ears: Tm's gray and intact, normal light reflex; Nose: mucosa pink and moist, septum midline; Throat/Mouth: + PND. Teeth present, mucosa pink and moist, no exudate noted, no lesions or ulcerations noted.  Neck: Neck supple, trachea midline. No massses, lumps or thyromegaly present.  Cardiovascular: Normal rate and rhythm. S1,S2 noted.  No murmur, rubs or gallops noted. No JVD or BLE edema. No carotid bruits noted. Pulmonary/Chest: Normal effort and positive vesicular breath sounds. No respiratory distress. No wheezes, rales or ronchi noted.      Assessment & Plan:   Acute bacterial sinusitis  Can use a Neti Pot which can be purchased from your local drug store. Flonase 2 sprays each nostril for 3 days and then as needed. Amoxil BID for 10 days  RTC as needed or if symptoms persist.

## 2013-12-12 NOTE — Telephone Encounter (Signed)
Relevant patient education mailed to patient.  

## 2013-12-12 NOTE — Progress Notes (Signed)
HPI: Pt presents to the office today with complaints of URI symptoms and cough. Symptoms began two weeks ago with sneezing, runny nose, and headache. It has then progressed to facial pain, ear pressure, nasal congestion and cough. She does endorse some chills, with low grade fevers, and fatigue. She tried OTC antihistamines and mucinex with little relief. She does smoke.     Review of Systems    Past Medical History  Diagnosis Date  . Depression   . HLD (hyperlipidemia)   . Tobacco abuse   . OA (osteoarthritis)   . Anxiety disorder   . COPD (chronic obstructive pulmonary disease)   . Fibroids     Uterine  . Vitiligo 1975    Family History  Problem Relation Age of Onset  . Depression Mother   . Cancer Father   . Depression Father   . Miscarriages / Stillbirths Sister     History   Social History  . Marital Status: Married    Spouse Name: N/A    Number of Children: 3  . 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 Smoking status: Current Every Day Smoker -- 1.50 packs/day for 38 years    Types: Cigarettes  . Smokeless tobacco: Never Used  . Alcohol Use: No  . Drug Use: Yes     Comment: Marijuana  . Sexual Activity: Not Currently   Other Topics Concern  . Not on file   Social History Narrative   Lives with husband in [place] 1 1/2 PPD x 38 years      3 Engineer, civil (consulting) at [employer]      Daily caffeine use: 6-8      No alcohol      Illicit drug use-marijuana    Allergies  Allergen Reactions  . Codeine     REACTION: Nausea     Constitutional: Positive headache, fatigue and fever. Denies abrupt weight changes.  HEENT:  Positive eye pain, pressure behind the eyes, facial pain, nasal congestion, ear pressure, and sore throat. Denies eye redness, ear pain, ringing in the ears, wax buildup, runny nose or bloody nose. Respiratory: Positive cough. Denies difficulty breathing or  shortness of breath.  Cardiovascular: Denies chest pain, chest tightness, palpitations or swelling in the hands or feet.   No other specific complaints in a complete review of systems (except as listed in HPI above).  Objective:    General: Appears his stated age, well developed, well nourished in NAD. HEENT: Head: normal shape and size, sinus tenderness noted; Eyes: sclera white, no icterus, conjunctiva pink, PERRLA and EOMs intact; Ears: Tm's gray and intact, normal light reflex; Nose: mucosa pink and moist, Right maxillary tenderness, mild frontal sinus tenderness. septum midline; Throat/Mouth: + PND. Teeth present, mucosa pink and moist, no exudate noted, no lesions or ulcerations noted.  Neck: Neck supple, trachea midline. No massses, lumps or thyromegaly present.  Cardiovascular: Normal rate and rhythm. S1,S2 noted.  No murmur, rubs or gallops noted. No JVD or BLE edema. No carotid bruits noted. Pulmonary/Chest: Normal effort and positive vesicular breath sounds. No respiratory distress. No wheezes, rales or ronchi noted.      Assessment & Plan:   Acute bacterial sinusitis  Can use a Neti Pot which can be purchased from your local drug store. Please stop smoking Flonase 2 sprays each nostril for 3 days and then as needed.  Amoxicillin BID for 10 days  RTC as needed or if symptoms persist.  Tsuruko Murtha, Demetrius Charity, Student-NP

## 2013-12-12 NOTE — Patient Instructions (Addendum)

## 2013-12-14 ENCOUNTER — Other Ambulatory Visit: Payer: Self-pay

## 2013-12-14 MED ORDER — LORAZEPAM 0.5 MG PO TABS: ORAL_TABLET | ORAL | Status: DC

## 2013-12-14 NOTE — Telephone Encounter (Signed)
Pt left v/m requesting refill lorazepam to [facility] [address]. Pt request cb when refilled.

## 2013-12-15 NOTE — Telephone Encounter (Signed)
Lm on pts vm informing her Rx has been called in to requested pharmacy 

## 2013-12-20 ENCOUNTER — Encounter: Payer: Self-pay | Admitting: Internal Medicine

## 2013-12-20 ENCOUNTER — Telehealth: Payer: Self-pay | Admitting: Family Medicine

## 2013-12-20 ENCOUNTER — Ambulatory Visit (INDEPENDENT_AMBULATORY_CARE_PROVIDER_SITE_OTHER): Payer: Commercial Indemnity | Admitting: Internal Medicine

## 2013-12-20 VITALS — BP 118/82 | HR 68 | Temp 97.7°F | Wt 113.0 lb

## 2013-12-20 DIAGNOSIS — J019 Acute sinusitis, unspecified: Secondary | ICD-10-CM

## 2013-12-20 DIAGNOSIS — M7989 Other specified soft tissue disorders: Secondary | ICD-10-CM

## 2013-12-20 DIAGNOSIS — M79609 Pain in unspecified limb: Secondary | ICD-10-CM

## 2013-12-20 DIAGNOSIS — M79671 Pain in right foot: Secondary | ICD-10-CM

## 2013-12-20 MED ORDER — METHYLPREDNISOLONE ACETATE 40 MG/ML IJ SUSP
80.0000 mg | Freq: Once | INTRAMUSCULAR | Status: AC
  Administered 2013-12-20: 80 mg via INTRAMUSCULAR

## 2013-12-20 NOTE — Addendum Note (Signed)
Addended by: Lurlean Nanny on: 12/20/2013 02:09 PM   Modules accepted: Orders

## 2013-12-20 NOTE — Telephone Encounter (Signed)
Relevant patient education mailed to patient.  

## 2013-12-20 NOTE — Patient Instructions (Addendum)

## 2013-12-20 NOTE — Progress Notes (Signed)
Subjective:    Patient ID: [name] [name], female    DOB: 23-Nov-1960, 53 y.o.   MRN: [id number]  HPI  Pt presents to the clinic today with c/o right foot pain. She reports this started about 4-6 weeks ago. The pain is intermittent but seems to have gotten worse over the last week. She describes the pain as sore, achy and tingling. She reports the pain radiates all the way up her leg into her back. She denies any specific injury to the area. She has not taken anything OTC. She does report that she stands a lot at work.  Additionally, she c/o continued cough and chest congestion. She was seen for the same 12/12/13 and diagnosed with sinusitis. She was treated with Flonase and Amoxil. She does have a history of COPD and continues to smoke.  Review of Systems      Past Medical History  Diagnosis Date  . Depression   . HLD (hyperlipidemia)   . Tobacco abuse   . OA (osteoarthritis)   . Anxiety disorder   . COPD (chronic obstructive pulmonary disease)   . Fibroids     Uterine  . Vitiligo 1975    Current Outpatient Prescriptions  Medication Sig Dispense Refill  . amoxicillin (AMOXIL) 875 MG tablet Take 1 tablet (875 mg total) by mouth 2 (two) times daily.  20 tablet  0  . diclofenac (VOLTAREN) 75 MG EC tablet Take 1 tablet (75 mg total) by mouth 2 (two) times daily.  60 tablet  3  . estradiol (VIVELLE-DOT) 0.075 MG/24HR Place 1 patch onto the skin 2 (two) times a week.  8 patch  0  . fluconazole (DIFLUCAN) 150 MG tablet Take 1 tablet (150 mg total) by mouth once.  1 tablet  0  . fluticasone (FLONASE) 50 MCG/ACT nasal spray Place 2 sprays into both nostrils daily.  16 g  6  . ibuprofen (ADVIL,MOTRIN) 600 MG tablet Take 600 mg by mouth every 6 (six) hours as needed.      Marland Kitchen LORazepam (ATIVAN) 0.5 MG tablet TAKE ONE TABLET BY MOUTH EVERY 8 HOURS AS NEEDED  30 tablet  0   No current facility-administered medications for this visit.    Allergies  Allergen Reactions  . Codeine    REACTION: Nausea    Family History  Problem Relation Age of Onset  . Depression Mother   . Cancer Father   . Depression Father   . Miscarriages / Stillbirths Sister     History   Social History  . Marital Status: Married    Spouse Name: N/A    Number of Children: 3  . 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 Smoking status: Current Every Day Smoker -- 1.50 packs/day for 38 years    Types: Cigarettes  . Smokeless tobacco: Never Used  . Alcohol Use: No  . Drug Use: Yes     Comment: Marijuana  . Sexual Activity: Not Currently   Other Topics Concern  . Not on file   Social History Narrative   Lives with husband in [place] 1 1/2 PPD x 38 years      3 Engineer, civil (consulting) at [employer]      Daily caffeine use: 6-8      No alcohol      Illicit drug use-marijuana     Constitutional: Denies fever, malaise,  fatigue, headache or abrupt weight changes.  HEENT: Denies eye pain, eye redness, ear pain, ringing in the ears, wax buildup, runny nose, nasal congestion, bloody nose, or sore throat. Respiratory: Pt reports cough. Denies difficulty breathing, shortness of breath, or sputum production.   Cardiovascular:  Denies chest pain, chest tightness, palpitations or swelling in the hands or feet.  Musculoskeletal: Pt reports right foot pain. Denies decrease in range of motion, difficulty with gait, muscle pain.  Skin: Denies redness, rashes, lesions or ulcercations.   No other specific complaints in a complete review of systems (except as listed in HPI above).  Objective:   Physical Exam  BP 118/82  Pulse 68  Temp(Src) 97.7 F (36.5 C) (Oral)  Wt 113 lb (51.256 kg)  SpO2 98% Wt Readings from Last 3 Encounters:  12/20/13 113 lb (51.256 kg)  12/12/13 113 lb 8 oz (51.483 kg)  08/23/13 115 lb 12 oz (52.504 kg)    General: Appears her stated age, well developed, well nourished in NAD. Skin:  Warm, dry and intact. No rashes, lesions or ulcerations noted. HEENT: Head: normal shape and size; Eyes: sclera white, no icterus, conjunctiva pink, PERRLA and EOMs intact; Ears: Tm's gray and intact, normal light reflex; Nose: mucosa pink and moist, septum midline; Throat/Mouth: Teeth present, mucosa pink and moist, no exudate, lesions or ulcerations noted.  Cardiovascular: Normal rate and rhythm. S1,S2 noted.  No murmur, rubs or gallops noted. No JVD or BLE edema. No carotid bruits noted. Pulmonary/Chest: Normal effort and positive vesicular breath sounds. No respiratory distress. No wheezes, rales or ronchi noted.  Musculoskeletal: Normal flexion and extension of the right pinky. 1+ swelling of the right pinky toe noted. No difficulty with gait.   BMET    Component Value Date/Time   NA 140 08/22/2013 1154   K 4.4 08/22/2013 1154   CL 104 08/22/2013 1154   CO2 28 08/22/2013 1154   GLUCOSE 87 08/22/2013 1154   BUN 10 08/22/2013 1154   CREATININE 0.8 08/22/2013 1154   CALCIUM 9.6 08/22/2013 1154   GFRNONAA >60 09/15/2010 0450   GFRAA  Value: >60        The eGFR has been calculated using the MDRD equation. This calculation has not been validated in all clinical situations. eGFR's persistently <60 mL/min signify possible Chronic Kidney Disease. 09/15/2010 0450    Lipid Panel     Component Value Date/Time   CHOL 222* 08/22/2013 1154   TRIG 132.0 08/22/2013 1154   HDL 47.20 08/22/2013 1154   CHOLHDL 5 08/22/2013 1154   VLDL 26.4 08/22/2013 1154   LDLCALC 137* 02/06/2010 1422    CBC    Component Value Date/Time   WBC 7.2 08/22/2013 1154   RBC 5.16* 08/22/2013 1154   HGB 15.9* 08/22/2013 1154   HCT 46.4* 08/22/2013 1154   PLT 164.0 08/22/2013 1154   MCV 89.9 08/22/2013 1154   MCH 29.2 09/15/2010 0450   MCHC 34.3 08/22/2013 1154   RDW 13.3 08/22/2013 1154   LYMPHSABS 1.7 03/21/2012 1517   MONOABS 0.6 03/21/2012 1517   EOSABS 0.1 03/21/2012 1517   BASOSABS 0.1 03/21/2012 1517           Assessment & Plan:   Right Foot Pain and Swelling:  ? Arthritis Try Aleve BID Elevate the foot and put ice on it at night to help with inflammation Will give 80 mg Depo IM today  Cough and Chest Congestion:  Improving Finish the course of abx Stop smoking  RTC as  needed or if symptoms persist or worsen

## 2013-12-20 NOTE — Progress Notes (Signed)
Pre visit review using our clinic review tool, if applicable. No additional management support is needed unless otherwise documented below in the visit note. 

## 2014-05-28 ENCOUNTER — Telehealth: Payer: Self-pay | Admitting: *Deleted

## 2014-05-28 DIAGNOSIS — Z780 Asymptomatic menopausal state: Secondary | ICD-10-CM

## 2014-05-28 MED ORDER — ESTRADIOL 0.075 MG/24HR TD PTTW: 1.0000 | MEDICATED_PATCH | TRANSDERMAL | Status: DC

## 2014-05-28 NOTE — Telephone Encounter (Signed)
Pharmacy faxed refill request for vivelle dot.  Medication refill has been electronically sent.

## 2014-05-29 ENCOUNTER — Telehealth: Payer: Self-pay | Admitting: *Deleted

## 2014-05-29 DIAGNOSIS — Z780 Asymptomatic menopausal state: Secondary | ICD-10-CM

## 2014-05-29 MED ORDER — ESTRADIOL 0.075 MG/24HR TD PTTW: 1.0000 | MEDICATED_PATCH | TRANSDERMAL | Status: DC

## 2014-05-29 NOTE — Telephone Encounter (Signed)
Pharmacy states they did not receive refills for vivelle dot.

## 2014-05-31 ENCOUNTER — Other Ambulatory Visit: Payer: Self-pay | Admitting: *Deleted

## 2014-05-31 NOTE — Telephone Encounter (Signed)
Reading to authorize patients prescription for estrogen patches.  They stated that they did not receive the electronic prescription.

## 2014-06-29 ENCOUNTER — Ambulatory Visit (INDEPENDENT_AMBULATORY_CARE_PROVIDER_SITE_OTHER): Payer: Commercial Indemnity | Admitting: Family Medicine

## 2014-06-29 ENCOUNTER — Telehealth: Payer: Self-pay

## 2014-06-29 ENCOUNTER — Encounter: Payer: Self-pay | Admitting: Family Medicine

## 2014-06-29 VITALS — BP 126/70 | HR 58 | Temp 97.9°F | Wt 112.2 lb

## 2014-06-29 DIAGNOSIS — M25559 Pain in unspecified hip: Secondary | ICD-10-CM

## 2014-06-29 DIAGNOSIS — R112 Nausea with vomiting, unspecified: Secondary | ICD-10-CM

## 2014-06-29 DIAGNOSIS — M25551 Pain in right hip: Secondary | ICD-10-CM

## 2014-06-29 MED ORDER — PROMETHAZINE HCL 12.5 MG PO TABS: 12.5000 mg | ORAL_TABLET | Freq: Three times a day (TID) | ORAL | Status: DC | PRN

## 2014-06-29 NOTE — Patient Instructions (Signed)
Good to see you. Drink plenty of fluids.  Take phenergan as needed for nausea- this will make you sleepy. Please call me with an update.

## 2014-06-29 NOTE — Progress Notes (Signed)
Pre visit review using our clinic review tool, if applicable. No additional management support is needed unless otherwise documented below in the visit note. 

## 2014-06-29 NOTE — Telephone Encounter (Signed)
Pt left v/m; pt was seen today and wants to know what Dr Deborra Medina decided was her diagnosis.pt request cb.

## 2014-06-29 NOTE — Assessment & Plan Note (Addendum)
New- probable OA but full exam not done. I suggested she return for hip xray when her nausea improves. The patient indicates understanding of these issues and agrees with the plan. Explained that she should not take other people's medications and I do not prescribe narcotics without thorough evaluation and rarely for OA.

## 2014-06-29 NOTE — Assessment & Plan Note (Signed)
New-? Adverse rxn to vicodin (no prescribed by me) or gastroenteritis. eRx sent for phenergan, push fluids. Call or return to clinic prn if these symptoms worsen or fail to improve as anticipated. The patient indicates understanding of these issues and agrees with the plan.

## 2014-06-29 NOTE — Telephone Encounter (Signed)
Spoke to pt and advised her that Dx, per note, was unspecified vomiting. Pt had obtained a vicodin, that she states she "sent my (her) son out to find" which she took on an empty stomach and may have caused her to become sick

## 2014-06-29 NOTE — Progress Notes (Signed)
Subjective:   Patient ID: [name] [name], female    DOB: [birth year], 53 y.o.   MRN: [id number]  [name] [name] is a pleasant 53 y.o. year old female who presents to clinic today with Hip Pain and Emesis  on 06/29/2014  HPI: Here for several concerns- walked in for acute visit.  Emesis- was nauseated and vomited once last night then again this am. Had some diarrhea as well. No fevers or chills but feels tired "I can't work."  Vomited first time 30 minutes after taking her son's vicodin for right hip pain.  H/o OA.  Feels pain is worse- voltaren and tylenol not working.  "I need something for my pain."  Pain is intermittent- feels it hurts when walking, standing, sitting.  No radiation of pain to her groin. No LE weakness. No recent falls or injuries.  Current Outpatient Prescriptions on File Prior to Visit  Medication Sig Dispense Refill  . diclofenac (VOLTAREN) 75 MG EC tablet Take 1 tablet (75 mg total) by mouth 2 (two) times daily.  60 tablet  3  . estradiol (VIVELLE-DOT) 0.075 MG/24HR Place 1 patch onto the skin 2 (two) times a week.  8 patch  11  . fluticasone (FLONASE) 50 MCG/ACT nasal spray Place 2 sprays into both nostrils daily.  16 g  6  . ibuprofen (ADVIL,MOTRIN) 600 MG tablet Take 600 mg by mouth every 6 (six) hours as needed.      Marland Kitchen LORazepam (ATIVAN) 0.5 MG tablet TAKE ONE TABLET BY MOUTH EVERY 8 HOURS AS NEEDED  30 tablet  0   No current facility-administered medications on file prior to visit.    Allergies  Allergen Reactions  . Codeine     REACTION: Nausea    Past Medical History  Diagnosis Date  . Depression   . HLD (hyperlipidemia)   . Tobacco abuse   . OA (osteoarthritis)   . Anxiety disorder   . COPD (chronic obstructive pulmonary disease)   . Fibroids     Uterine  . Vitiligo 1975    Past Surgical History  Procedure Laterality Date  . Abdominal hysterectomy  2011  . Ureter surgery  2011  . Mandible surgery  1980  . Hip arthroscopy   2003    Family History  Problem Relation Age of Onset  . Depression Mother   . Cancer Father   . Depression Father   . Miscarriages / Stillbirths Sister     History   Social History  . Marital Status: Married    Spouse Name: N/A    Number of Children: 3  . 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 Smoking status: Current Every Day Smoker -- 1.50 packs/day for 38 years    Types: Cigarettes  . Smokeless tobacco: Never Used  . Alcohol Use: No  . Drug Use: Yes     Comment: Marijuana  . Sexual Activity: Not Currently   Other Topics Concern  . Not on file   Social History Narrative   Lives with husband in [place] 1 1/2 PPD x 38 years      3 Engineer, civil (consulting) at [employer]      Daily caffeine use: 6-8      No alcohol      Illicit drug use-marijuana   The PMH, PSH, Social History, Family History, Medications, and allergies have been  reviewed in [facility], and have been updated if relevant.   Review of Systems    See HPI No fevers +hip pain +hand pain +diarrhea No LE edema No HA Objective:    BP 126/70  Pulse 58  Temp(Src) 97.9 F (36.6 C) (Oral)  Wt 112 lb 4 oz (50.916 kg)  SpO2 98%   Physical Exam  Nursing note and vitals reviewed. Constitutional: She appears well-developed and well-nourished. No distress.  HENT:  Mouth/Throat: Oropharynx is clear and moist and mucous membranes are normal.  Abdominal: Soft. Normal appearance and bowel sounds are normal. There is no tenderness. There is negative Murphy's sign.  Musculoskeletal:  Normal gait. Did not want full hip exam today- she is not feeling well  Skin: Skin is warm and dry.  Psychiatric: She has a normal mood and affect. Her behavior is normal. Judgment and thought content normal.          Assessment & Plan:   Right hip pain  Non-intractable vomiting with nausea, vomiting of unspecified type No Follow-up on  file.

## 2014-07-02 ENCOUNTER — Encounter: Payer: Self-pay | Admitting: Internal Medicine

## 2014-07-02 ENCOUNTER — Ambulatory Visit (INDEPENDENT_AMBULATORY_CARE_PROVIDER_SITE_OTHER): Payer: Commercial Indemnity | Admitting: Internal Medicine

## 2014-07-02 VITALS — BP 134/82 | HR 66 | Temp 97.9°F | Wt 112.0 lb

## 2014-07-02 DIAGNOSIS — J309 Allergic rhinitis, unspecified: Secondary | ICD-10-CM

## 2014-07-02 MED ORDER — METHYLPREDNISOLONE ACETATE 80 MG/ML IJ SUSP
80.0000 mg | Freq: Once | INTRAMUSCULAR | Status: AC
  Administered 2014-07-02: 80 mg via INTRAMUSCULAR

## 2014-07-02 NOTE — Addendum Note (Signed)
Addended by: Lurlean Nanny on: 07/02/2014 05:01 PM   Modules accepted: Orders

## 2014-07-02 NOTE — Progress Notes (Signed)
Pre visit review using our clinic review tool, if applicable. No additional management support is needed unless otherwise documented below in the visit note. 

## 2014-07-02 NOTE — Progress Notes (Signed)
Subjective:    Patient ID: [name] [name], female    DOB: 1961/05/18, 53 y.o.   MRN: [id number]  HPI  Pt presents to the clinic today with c/o nausea, fatigue, post nasal drip. She was seen for the same 1 week ago by Dr. Deborra Medina. She was given phenergan for nausea for possible reaction to hydrocodone she had taken. She reports that she has facial pressure, headaches. She is blowing clear/yellow mucous out of her nose. She denies sore throat or cough. She reports that she symptoms have been going on for the last 2 weeks. She has tried nyquil OTC.  She is a current smoker. She has not had sick contacts. She does have a history allergies but denies breathing problems.   Review of Systems      Past Medical History  Diagnosis Date  . Depression   . HLD (hyperlipidemia)   . Tobacco abuse   . OA (osteoarthritis)   . Anxiety disorder   . COPD (chronic obstructive pulmonary disease)   . Fibroids     Uterine  . Vitiligo 1975    Current Outpatient Prescriptions  Medication Sig Dispense Refill  . diclofenac (VOLTAREN) 75 MG EC tablet Take 1 tablet (75 mg total) by mouth 2 (two) times daily.  60 tablet  3  . estradiol (VIVELLE-DOT) 0.075 MG/24HR Place 1 patch onto the skin 2 (two) times a week.  8 patch  11  . LORazepam (ATIVAN) 0.5 MG tablet TAKE ONE TABLET BY MOUTH EVERY 8 HOURS AS NEEDED  30 tablet  0  . promethazine (PHENERGAN) 12.5 MG tablet Take 1 tablet (12.5 mg total) by mouth every 8 (eight) hours as needed for nausea or vomiting.  20 tablet  0   No current facility-administered medications for this visit.    Allergies  Allergen Reactions  . Codeine     REACTION: Nausea    Family History  Problem Relation Age of Onset  . Depression Mother   . Cancer Father   . Depression Father   . Miscarriages / Stillbirths Sister     History   Social History  . Marital Status: Married    Spouse Name: N/A    Number of Children: 3  . Years of Education: N/A   Occupational History    . 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 Smoking status: Current Every Day Smoker -- 1.50 packs/day for 38 years    Types: Cigarettes  . Smokeless tobacco: Never Used  . Alcohol Use: No  . Drug Use: Yes     Comment: Marijuana  . Sexual Activity: Not Currently   Other Topics Concern  . Not on file   Social History Narrative   Lives with husband in [place] 1 1/2 PPD x 38 years      3 Engineer, civil (consulting) at [employer]      Daily caffeine use: 6-8      No alcohol      Illicit drug use-marijuana     Constitutional: Pt reports headache and fatigue. Denies fever, malaise,  or abrupt weight changes.  HEENT: Pt reports nasal congestion. Denies eye pain, eye redness, ear pain, ringing in the ears, wax buildup, runny nose, bloody nose, or sore throat. Respiratory: Denies difficulty breathing, shortness of breath, cough or sputum production.     No other specific complaints in a complete review of systems (except as listed in HPI above).  Objective:   Physical Exam   BP 134/82  Pulse 66  Temp(Src) 97.9 F (36.6 C) (Oral)  Wt 112 lb (50.803 kg)  SpO2 98% Wt Readings from Last 3 Encounters:  07/02/14 112 lb (50.803 kg)  06/29/14 112 lb 4 oz (50.916 kg)  12/20/13 113 lb (51.256 kg)    General: Appears her stated age, well developed, well nourished in NAD. HEENT: Ears: Tm's gray and intact, normal light reflex; Nose: mucosa pink and moist, septum midline; Throat/Mouth: Teeth present, mucosa erythematous and moist, no exudate, lesions or ulcerations noted.  Cardiovascular: Normal rate and rhythm. S1,S2 noted.  No murmur, rubs or gallops noted.  Pulmonary/Chest: Normal effort and coarse vesicular breath sounds. No respiratory distress. No wheezes, rales or ronchi noted.   BMET    Component Value Date/Time   NA 140 08/22/2013 1154   K 4.4 08/22/2013 1154   CL 104 08/22/2013 1154   CO2 28 08/22/2013 1154   GLUCOSE 87 08/22/2013 1154   BUN 10  08/22/2013 1154   CREATININE 0.8 08/22/2013 1154   CALCIUM 9.6 08/22/2013 1154   GFRNONAA >60 09/15/2010 0450   GFRAA  Value: >60        The eGFR has been calculated using the MDRD equation. This calculation has not been validated in all clinical situations. eGFR's persistently <60 mL/min signify possible Chronic Kidney Disease. 09/15/2010 0450    Lipid Panel     Component Value Date/Time   CHOL 222* 08/22/2013 1154   TRIG 132.0 08/22/2013 1154   HDL 47.20 08/22/2013 1154   CHOLHDL 5 08/22/2013 1154   VLDL 26.4 08/22/2013 1154   LDLCALC 137* 02/06/2010 1422    CBC    Component Value Date/Time   WBC 7.2 08/22/2013 1154   RBC 5.16* 08/22/2013 1154   HGB 15.9* 08/22/2013 1154   HCT 46.4* 08/22/2013 1154   PLT 164.0 08/22/2013 1154   MCV 89.9 08/22/2013 1154   MCH 29.2 09/15/2010 0450   MCHC 34.3 08/22/2013 1154   RDW 13.3 08/22/2013 1154   LYMPHSABS 1.7 03/21/2012 1517   MONOABS 0.6 03/21/2012 1517   EOSABS 0.1 03/21/2012 1517   BASOSABS 0.1 03/21/2012 1517    Hgb A1C No results found for this basename: HGBA1C        Assessment & Plan:   Allergic Rhinitis:  Go ahead and start taking your allegra and flonase OTC 80 mg Depo IM today No s/s of infection so no abx given at this time  RTC as needed or if symptoms persist or worsen

## 2014-07-02 NOTE — Patient Instructions (Addendum)

## 2014-08-27 ENCOUNTER — Ambulatory Visit (INDEPENDENT_AMBULATORY_CARE_PROVIDER_SITE_OTHER): Payer: Commercial Indemnity | Admitting: Family Medicine

## 2014-08-27 ENCOUNTER — Encounter: Payer: Self-pay | Admitting: Family Medicine

## 2014-08-27 VITALS — BP 142/92 | HR 79 | Temp 97.9°F | Wt 111.5 lb

## 2014-08-27 DIAGNOSIS — M25512 Pain in left shoulder: Secondary | ICD-10-CM

## 2014-08-27 DIAGNOSIS — F339 Major depressive disorder, recurrent, unspecified: Secondary | ICD-10-CM | POA: Insufficient documentation

## 2014-08-27 DIAGNOSIS — J410 Simple chronic bronchitis: Secondary | ICD-10-CM | POA: Insufficient documentation

## 2014-08-27 DIAGNOSIS — F329 Major depressive disorder, single episode, unspecified: Secondary | ICD-10-CM

## 2014-08-27 DIAGNOSIS — Z720 Tobacco use: Secondary | ICD-10-CM

## 2014-08-27 DIAGNOSIS — R059 Cough, unspecified: Secondary | ICD-10-CM

## 2014-08-27 DIAGNOSIS — R05 Cough: Secondary | ICD-10-CM

## 2014-08-27 DIAGNOSIS — F17200 Nicotine dependence, unspecified, uncomplicated: Secondary | ICD-10-CM

## 2014-08-27 DIAGNOSIS — F32A Depression, unspecified: Secondary | ICD-10-CM | POA: Insufficient documentation

## 2014-08-27 HISTORY — DX: Major depressive disorder, recurrent, unspecified: F33.9

## 2014-08-27 LAB — CBC WITH DIFFERENTIAL/PLATELET
BASOS PCT: 0.8 % (ref 0.0–3.0)
Basophils Absolute: 0.1 10*3/uL (ref 0.0–0.1)
EOS PCT: 1.8 % (ref 0.0–5.0)
Eosinophils Absolute: 0.1 10*3/uL (ref 0.0–0.7)
HEMATOCRIT: 45.9 % (ref 36.0–46.0)
Hemoglobin: 15.4 g/dL — ABNORMAL HIGH (ref 12.0–15.0)
LYMPHS ABS: 1.6 10*3/uL (ref 0.7–4.0)
Lymphocytes Relative: 20.7 % (ref 12.0–46.0)
MCHC: 33.6 g/dL (ref 30.0–36.0)
MCV: 88.9 fl (ref 78.0–100.0)
MONO ABS: 0.6 10*3/uL (ref 0.1–1.0)
Monocytes Relative: 7.8 % (ref 3.0–12.0)
NEUTROS ABS: 5.3 10*3/uL (ref 1.4–7.7)
Neutrophils Relative %: 68.9 % (ref 43.0–77.0)
Platelets: 188 10*3/uL (ref 150.0–400.0)
RBC: 5.17 Mil/uL — ABNORMAL HIGH (ref 3.87–5.11)
RDW: 13.6 % (ref 11.5–15.5)
WBC: 7.7 10*3/uL (ref 4.0–10.5)

## 2014-08-27 LAB — COMPREHENSIVE METABOLIC PANEL
ALT: 11 U/L (ref 0–35)
AST: 18 U/L (ref 0–37)
Albumin: 3.9 g/dL (ref 3.5–5.2)
Alkaline Phosphatase: 52 U/L (ref 39–117)
BILIRUBIN TOTAL: 0.6 mg/dL (ref 0.2–1.2)
BUN: 12 mg/dL (ref 6–23)
CO2: 25 mEq/L (ref 19–32)
CREATININE: 0.9 mg/dL (ref 0.4–1.2)
Calcium: 9.4 mg/dL (ref 8.4–10.5)
Chloride: 108 mEq/L (ref 96–112)
GFR: 69.56 mL/min (ref >60.00)
GLUCOSE: 98 mg/dL (ref 70–99)
Potassium: 4.5 mEq/L (ref 3.5–5.1)
SODIUM: 142 meq/L (ref 135–145)
TOTAL PROTEIN: 6.6 g/dL (ref 6.0–8.3)

## 2014-08-27 MED ORDER — BUPROPION HCL ER (XL) 150 MG PO TB24: 150.0000 mg | ORAL_TABLET | Freq: Every day | ORAL | Status: DC

## 2014-08-27 MED ORDER — LORAZEPAM 0.5 MG PO TABS: ORAL_TABLET | ORAL | Status: DC

## 2014-08-27 NOTE — Progress Notes (Signed)
Pre visit review using our clinic review tool, if applicable. No additional management support is needed unless otherwise documented below in the visit note. 

## 2014-08-27 NOTE — Patient Instructions (Signed)
Good to see you. We will call you with your lab results.  We are starting Wellbutrin 150 mg XL daily- please call me in a few weeks with an update.  Please stop by to see Rosaria Ferries on your way out to look into coverage for your CT scan.

## 2014-08-27 NOTE — Progress Notes (Signed)
Subjective:   Patient ID: [name] [name], female    DOB: 1961/08/05, 53 y.o.   MRN: [id number]  [name] [name] is a pleasant 53 y.o. year old female who presents to clinic today with congestion in chest  on 08/27/2014  HPI: Cough- ongoing for months.  Has been seen here 3 times for this and once at urgent care on 11/11- given a zpack from UC. Cough remains productive.  No hemoptysis.  No fevers or night sweats.  She is a long term smoker. She feels appetite is "ok." Wt Readings from Last 3 Encounters:  08/27/14 111 lb 8 oz (50.576 kg)  07/02/14 112 lb (50.803 kg)  06/29/14 112 lb 4 oz (50.916 kg)   Wants to quit smoking.    Feels very anxious that she has lung CA.  Has been more irritable with coworkers and family. Has been taking prn Ativan.  Current Outpatient Prescriptions on File Prior to Visit  Medication Sig Dispense Refill  . diclofenac (VOLTAREN) 75 MG EC tablet Take 1 tablet (75 mg total) by mouth 2 (two) times daily. 60 tablet 3  . estradiol (VIVELLE-DOT) 0.075 MG/24HR Place 1 patch onto the skin 2 (two) times a week. 8 patch 11   No current facility-administered medications on file prior to visit.    Allergies  Allergen Reactions  . Codeine     REACTION: Nausea    Past Medical History  Diagnosis Date  . Depression   . HLD (hyperlipidemia)   . Tobacco abuse   . OA (osteoarthritis)   . Anxiety disorder   . COPD (chronic obstructive pulmonary disease)   . Fibroids     Uterine  . Vitiligo 1975    Past Surgical History  Procedure Laterality Date  . Abdominal hysterectomy  2011  . Ureter surgery  2011  . Mandible surgery  1980  . Hip arthroscopy  2003    Family History  Problem Relation Age of Onset  . Depression Mother   . Cancer Father   . Depression Father   . Miscarriages / Stillbirths Sister     History   Social History  . Marital Status: Married    Spouse Name: N/A    Number of Children: 3  . Years of Education: N/A   Occupational  History  . 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 Smoking status: Current Every Day Smoker -- 1.50 packs/day for 38 years    Types: Cigarettes  . Smokeless tobacco: Never Used  . Alcohol Use: No  . Drug Use: Yes     Comment: Marijuana  . Sexual Activity: Not Currently   Other Topics Concern  . Not on file   Social History Narrative   Lives with husband in [place] 1 1/2 PPD x 38 years      3 Engineer, civil (consulting) at [employer]      Daily caffeine use: 6-8      No alcohol      Illicit drug use-marijuana   The PMH, PSH, Social History, Family History, Medications, and allergies have been reviewed in [facility], and have been updated if relevant.   Review of Systems  Constitutional: Positive for fatigue. Negative for fever and unexpected weight change.  Respiratory: Positive for cough and wheezing.   Cardiovascular: Negative.   Psychiatric/Behavioral: Positive for dysphoric mood. Negative for suicidal ideas, behavioral problems, confusion, sleep disturbance, decreased concentration and agitation. The patient is  nervous/anxious.   All other systems reviewed and are negative. + left shoulder pain     Objective:    BP 142/92 mmHg  Pulse 79  Temp(Src) 97.9 F (36.6 C) (Oral)  Wt 111 lb 8 oz (50.576 kg)  SpO2 97%  Wt Readings from Last 3 Encounters:  08/27/14 111 lb 8 oz (50.576 kg)  07/02/14 112 lb (50.803 kg)  06/29/14 112 lb 4 oz (50.916 kg)    Physical Exam  Constitutional: She is oriented to person, place, and time. She appears well-developed and well-nourished. No distress.  HENT:  Head: Normocephalic.  Cardiovascular: Normal rate and regular rhythm.   Pulmonary/Chest: Effort normal and breath sounds normal. No respiratory distress. She has no wheezes. She has no rales.  Abdominal: Soft. Bowel sounds are normal.  Neurological: She is alert and oriented to person, place, and time.  Psychiatric: Her behavior is normal. Judgment and thought  content normal.  Tearful, does seem anxious  Nursing note and vitals reviewed.         Assessment & Plan:   TOBACCO ABUSE  Cough  Left shoulder pain No Follow-up on file.

## 2014-08-27 NOTE — Assessment & Plan Note (Signed)
Ready to quit. Discussed tx options.  Wellbutrin has been effective for her depression symptoms in past and she does feel more depressed.  Will start Wellbutrin 150 mg XL daily- hopefully this will help with tobacco cessation as well.

## 2014-08-27 NOTE — Assessment & Plan Note (Signed)
Persistent and concerning for chronic bronchitis vs malignancy. She is asking for low dose CT for lung CA screening- advised her that insurance may not pay for this and I suggested CXR.  She would like for me to order CT first and if not covered, she agrees to CXR.

## 2014-08-27 NOTE — Assessment & Plan Note (Signed)
Rotator cuff vs neck impingement. Cannot rule out referred pain from lung malignancy.  Proceed with lung CA work up first. The patient indicates understanding of these issues and agrees with the plan.

## 2014-08-27 NOTE — Assessment & Plan Note (Signed)
Deteriorated.  See above. 

## 2014-08-31 ENCOUNTER — Ambulatory Visit (INDEPENDENT_AMBULATORY_CARE_PROVIDER_SITE_OTHER)
Admission: RE | Admit: 2014-08-31 | Discharge: 2014-08-31 | Disposition: A | Discharge status: Home / Self Care | Payer: Commercial Indemnity | Source: Ambulatory Visit | Attending: Family Medicine | Admitting: Family Medicine

## 2014-08-31 DIAGNOSIS — Z720 Tobacco use: Secondary | ICD-10-CM

## 2014-08-31 DIAGNOSIS — F17200 Nicotine dependence, unspecified, uncomplicated: Secondary | ICD-10-CM

## 2014-09-03 ENCOUNTER — Telehealth: Payer: Self-pay | Admitting: Family Medicine

## 2014-09-03 NOTE — Telephone Encounter (Signed)
Pt returned Dr. Hulen Shouts call, [place] recommended phone note for Dr. Deborra Medina to return the pt's call at [phone] / lt

## 2014-09-03 NOTE — Telephone Encounter (Signed)
Returned pt's call.

## 2014-09-24 ENCOUNTER — Ambulatory Visit (INDEPENDENT_AMBULATORY_CARE_PROVIDER_SITE_OTHER)
Admission: RE | Admit: 2014-09-24 | Discharge: 2014-09-24 | Disposition: A | Discharge status: Home / Self Care | Payer: Commercial Indemnity | Source: Ambulatory Visit | Attending: Family Medicine | Admitting: Family Medicine

## 2014-09-24 ENCOUNTER — Encounter: Payer: Self-pay | Admitting: Family Medicine

## 2014-09-24 ENCOUNTER — Ambulatory Visit (INDEPENDENT_AMBULATORY_CARE_PROVIDER_SITE_OTHER): Payer: Commercial Indemnity | Admitting: Family Medicine

## 2014-09-24 VITALS — BP 90/62 | HR 56 | Temp 97.7°F | Ht 59.0 in | Wt 113.5 lb

## 2014-09-24 DIAGNOSIS — M5010 Cervical disc disorder with radiculopathy, unspecified cervical region: Secondary | ICD-10-CM

## 2014-09-24 MED ORDER — GABAPENTIN 300 MG PO CAPS: 300.0000 mg | ORAL_CAPSULE | Freq: Three times a day (TID) | ORAL | Status: DC

## 2014-09-24 MED ORDER — PREDNISONE 20 MG PO TABS
ORAL_TABLET | ORAL | Status: DC
Start: 2014-09-24 — End: 2014-12-19

## 2014-09-24 NOTE — Patient Instructions (Signed)

## 2014-09-24 NOTE — Progress Notes (Signed)
Dr. Frederico Hamman T. Mayo Owczarzak, MD, [facility] [place], [address] Phone: [phone] Fax: [phone]  09/24/2014  Patient: [name] [name], MRN: [id number], DOB: [birth year], 53 y.o.  Primary Physician:  Arnette Norris, MD  Chief Complaint: Arm Pain  Subjective:   [name] [name] is a 53 y.o. very pleasant female patient who presents with the following:  Started back in October and was in her shoulder and pain is improved. Hand will tingle like it is asleep. Left hand.  For the last several months she has been having pain in the posterior aspect of her neck as well as her shoulder blade on the left. It has since traveled down into her arm, now she is having some unusual sensations of tingling in her left hand. Her grip strength and arm strength is not diminished at all. She is not having a shoulder pain and impingement. She is currently taking some diclofenac intermittently.  She has not done any kind of procedures or manipulation at all.  Past Medical History, Surgical History, Social History, Family History, Problem List, Medications, and Allergies have been reviewed and updated if relevant.  GEN: No fevers, chills. Nontoxic. Primarily MSK c/o today. MSK: Detailed in the HPI GI: tolerating PO intake without difficulty Neuro: No numbness, parasthesias, or tingling associated. Otherwise the pertinent positives of the ROS are noted above.   Objective:   BP 90/62 mmHg  Pulse 56  Temp(Src) 97.7 F (36.5 C) (Oral)  Ht 4\' 11"  (1.499 m)  Wt 113 lb 8 oz (51.483 kg)  BMI 22.91 kg/m2   GEN: Well-developed,well-nourished,in no acute distress; alert,appropriate and cooperative throughout examination HEENT: Normocephalic and atraumatic without obvious abnormalities. Ears, externally no deformities PULM: Breathing comfortably in no respiratory distress EXT: No clubbing, cyanosis, or edema PSYCH: Normally interactive. Cooperative  during the interview. Pleasant. Friendly and conversant. Not anxious or depressed appearing. Normal, full affect.  CERVICAL SPINE EXAM Range of motion: Flexion, extension, lateral bending, and rotation: rel full Pain with terminal motion: mild Spinous Processes: NT SCM: NT Upper paracervical muscles: ttp Upper traps: NT C5-T1 intact, sensation and motor - tingling in hand on left  Shoulder: b Inspection: No muscle wasting or winging Ecchymosis/edema: neg  AC joint, scapula, clavicle: NT Cervical spine: NT, full ROM Spurling's: neg Abduction: full, 5/5 Flexion: full, 5/5 IR, full, lift-off: 5/5 ER at neutral: full, 5/5 AC crossover and compression: neg Neer: neg Hawkins: neg Drop Test: neg Empty Can: neg Supraspinatus insertion: NT Bicipital groove: NT Speed's: neg Yergason's: neg Sulcus sign: neg Scapular dyskinesis: none Grip 5/5   Radiology: Dg Cervical Spine Complete  09/24/2014   CLINICAL DATA:  Neck pain with left-sided radicular symptoms  EXAM: CERVICAL SPINE  4+ VIEWS  COMPARISON:  July 21, 2012  FINDINGS: Frontal, lateral, open-mouth odontoid, and bilateral oblique views were obtained. There is no demonstrable fracture or spondylolisthesis. Prevertebral soft tissues and predental space regions are normal. There is moderate disc space narrowing at C6-7, slightly progressed from prior study. Moderate narrowing at C7-T1 is stable. Other disc spaces appear normal. There are anterior osteophytes at C5 and C6 with calcification in the anterior ligament at C6-7. There is facet hypertrophy at C6-7 bilaterally, stable.  IMPRESSION: Osteoarthritic change, slightly more severe at C6-7 than on prior study. Otherwise no change. No fracture or spondylolisthesis.   Electronically Signed   By: Lowella Grip M.D.   On: 09/24/2014 10:08    Assessment and Plan:  Cervical disc disorder with radiculopathy of cervical region - Plan: DG Cervical Spine Complete  Most likely nerve  compression on the left side. We are going to give her a pulse doses of steroids and then try to titrate up her Neurontin dose. Also gave her some self McKenzie exercises.  Follow-up: 2 mo  New Prescriptions   GABAPENTIN (NEURONTIN) 300 MG CAPSULE    Take 1 capsule (300 mg total) by mouth 3 (three) times daily.   PREDNISONE (DELTASONE) 20 MG TABLET    2 tabs po for 5 days, then 1 po for 5 days   Orders Placed This Encounter  Procedures  . DG Cervical Spine Complete    Signed,  Chaylee Ehrsam T. Kawana Hegel, MD   Patient's Medications  New Prescriptions   GABAPENTIN (NEURONTIN) 300 MG CAPSULE    Take 1 capsule (300 mg total) by mouth 3 (three) times daily.   PREDNISONE (DELTASONE) 20 MG TABLET    2 tabs po for 5 days, then 1 po for 5 days  Previous Medications   BUPROPION (WELLBUTRIN XL) 150 MG 24 HR TABLET    Take 1 tablet (150 mg total) by mouth daily.   DICLOFENAC (VOLTAREN) 75 MG EC TABLET    Take 1 tablet (75 mg total) by mouth 2 (two) times daily.   ESTRADIOL (VIVELLE-DOT) 0.075 MG/24HR    Place 1 patch onto the skin 2 (two) times a week.   LORAZEPAM (ATIVAN) 0.5 MG TABLET    TAKE ONE TABLET BY MOUTH EVERY 8 HOURS AS NEEDED  Modified Medications   No medications on file  Discontinued Medications   No medications on file

## 2014-09-24 NOTE — Progress Notes (Signed)
Pre visit review using our clinic review tool, if applicable. No additional management support is needed unless otherwise documented below in the visit note. 

## 2014-11-12 ENCOUNTER — Other Ambulatory Visit: Payer: Self-pay | Admitting: *Deleted

## 2014-11-12 MED ORDER — GABAPENTIN 300 MG PO CAPS: 900.0000 mg | ORAL_CAPSULE | Freq: Three times a day (TID) | ORAL | Status: DC

## 2014-11-12 NOTE — Telephone Encounter (Signed)
This note is not helpful.  I gave her a titration up, so there will be variation in the dosage based on patient and their ability to go up on the dose based on SE.  Find out where she is in the titration. How many capsules, three times per day?

## 2014-11-12 NOTE — Telephone Encounter (Signed)
Patient came to the office stating that she is out of her Gabapentin because the way the script is written for three times a day. Patient stated that she is taking it the way that it was prescribed at last office visit 09/24/14. Patient requested that script be changed with correct directions so she can get it filled. Please let patient know when this has been corrected.

## 2014-11-12 NOTE — Telephone Encounter (Signed)
Spoke with Ms. Irby.  She is titrating up on the Gabapentin as instructed.  She is finishing up week 7 which is  600mg  (2 tabs) at breakfast,600mg  (2 tabs) at lunch and900mg  (3 tabs) at dinner.  She is getting ready to start week which is900mg  (3 tabs)at breakfast, 600mg  (2 tabs)at lunch and900mg  (3 tabs) at dinner.  The way the prescription is written is for Gabapentin 300 mg three times a day so she is out of the medication and can't get it filled again because of how the directions are written.  Needs new prescription sent in with her taking 8 tablets a day instead of three tablets a day.

## 2014-12-19 ENCOUNTER — Encounter: Payer: Self-pay | Admitting: Family Medicine

## 2014-12-19 ENCOUNTER — Ambulatory Visit (INDEPENDENT_AMBULATORY_CARE_PROVIDER_SITE_OTHER): Payer: Commercial Indemnity | Admitting: Family Medicine

## 2014-12-19 VITALS — BP 150/84 | HR 65 | Temp 98.2°F | Ht 59.0 in | Wt 115.8 lb

## 2014-12-19 DIAGNOSIS — M5010 Cervical disc disorder with radiculopathy, unspecified cervical region: Secondary | ICD-10-CM

## 2014-12-19 MED ORDER — LORAZEPAM 0.5 MG PO TABS: ORAL_TABLET | ORAL | Status: DC

## 2014-12-19 MED ORDER — GABAPENTIN 300 MG PO CAPS: 900.0000 mg | ORAL_CAPSULE | Freq: Three times a day (TID) | ORAL | Status: DC

## 2014-12-19 NOTE — Patient Instructions (Addendum)
Can try either Zyrtec or Allegra.  Tablets   GENTEAL eye drops

## 2014-12-19 NOTE — Progress Notes (Signed)
Dr. Frederico Hamman T. Edessa Jakubowicz, MD, [facility] [place], [address] Phone: [phone] Fax: [phone]  12/19/2014  Patient: [name] [name], MRN: [id number], DOB: [birth year], 54 y.o.  Primary Physician:  Arnette Norris, MD  Chief Complaint: Arm Pain  Subjective:   [name] [name] is a 54 y.o. very pleasant female patient who presents with the following:  Was up to 900 mg TID.  I remember this patient well who was having some cervical radiculopathy.  She responded well to Neurontin, and she was up to 900 mg by mouth 3 times a day.  Her insurance for some reason declined to refill her medication, I had tried to arrange for refills at appropriate dosing, but she still was unable to get it.  When she stopped her medication, her radicular symptoms returned.  She was encouraged by her results overall.  Also did give her a longer prednisone burst and taper initially.  She has been trying to do some home rehabilitation intermittently.  09/24/2014 Last OV with Owens Loffler, MD  Started back in October and was in her shoulder and pain is improved. Hand will tingle like it is asleep. Left hand.  For the last several months she has been having pain in the posterior aspect of her neck as well as her shoulder blade on the left. It has since traveled down into her arm, now she is having some unusual sensations of tingling in her left hand. Her grip strength and arm strength is not diminished at all. She is not having a shoulder pain and impingement. She is currently taking some diclofenac intermittently.  She has not done any kind of procedures or manipulation at all.  Past Medical History, Surgical History, Social History, Family History, Problem List, Medications, and Allergies have been reviewed and updated if relevant.  GEN: No fevers, chills. Nontoxic. Primarily MSK c/o today. MSK: Detailed in the HPI GI: tolerating PO intake without  difficulty Neuro: No numbness, parasthesias, or tingling associated. Otherwise the pertinent positives of the ROS are noted above.   Objective:   BP 150/84 mmHg  Pulse 65  Temp(Src) 98.2 F (36.8 C) (Oral)  Ht 4\' 11"  (1.499 m)  Wt 115 lb 12 oz (52.504 kg)  BMI 23.37 kg/m2   GEN: Well-developed,well-nourished,in no acute distress; alert,appropriate and cooperative throughout examination HEENT: Normocephalic and atraumatic without obvious abnormalities. Ears, externally no deformities PULM: Breathing comfortably in no respiratory distress EXT: No clubbing, cyanosis, or edema PSYCH: Normally interactive. Cooperative during the interview. Pleasant. Friendly and conversant. Not anxious or depressed appearing. Normal, full affect.  CERVICAL SPINE EXAM Range of motion: Flexion, extension, lateral bending, and rotation: rel full Pain with terminal motion: mild Spinous Processes: NT SCM: NT Upper paracervical muscles: ttp Upper traps: NT C5-T1 intact, sensation and motor - tingling in hand on left  Shoulder: b Inspection: No muscle wasting or winging Ecchymosis/edema: neg  AC joint, scapula, clavicle: NT Cervical spine: NT, full ROM Spurling's: neg Abduction: full, 5/5 Flexion: full, 5/5 IR, full, lift-off: 5/5 ER at neutral: full, 5/5 AC crossover and compression: neg Neer: neg Hawkins: neg Drop Test: neg Empty Can: neg Supraspinatus insertion: NT Bicipital groove: NT Speed's: neg Yergason's: neg Sulcus sign: neg Scapular dyskinesis: none Grip 5/5   Radiology:   Assessment and Plan:   Cervical disc disorder with radiculopathy of cervical region   Resume Neurontin, reviewed more accelerated taper.  I expect that she will do well.  She and I agreed that since she has  Has worsening symptoms.  She is going to follow-up with Me again if her radicular symptoms returned.  Also some allergy symptoms.  Patient Instructions  Can try either Zyrtec or Allegra.   Tablets   GENTEAL eye drops     Signed,  Larnce Schnackenberg T. Deidre Carino, MD   Patient's Medications  New Prescriptions   No medications on file  Previous Medications   BUPROPION (WELLBUTRIN XL) 150 MG 24 HR TABLET    Take 1 tablet (150 mg total) by mouth daily.   DICLOFENAC (VOLTAREN) 75 MG EC TABLET    Take 1 tablet (75 mg total) by mouth 2 (two) times daily.   ESTRADIOL (VIVELLE-DOT) 0.075 MG/24HR    Place 1 patch onto the skin 2 (two) times a week.  Modified Medications   Modified Medication Previous Medication   GABAPENTIN (NEURONTIN) 300 MG CAPSULE gabapentin (NEURONTIN) 300 MG capsule      Take 3 capsules (900 mg total) by mouth 3 (three) times daily. Follow titration as directed    Take 3 capsules (900 mg total) by mouth 3 (three) times daily. Follow titration as directed   LORAZEPAM (ATIVAN) 0.5 MG TABLET LORazepam (ATIVAN) 0.5 MG tablet      TAKE ONE TABLET BY MOUTH EVERY 8 HOURS AS NEEDED    TAKE ONE TABLET BY MOUTH EVERY 8 HOURS AS NEEDED  Discontinued Medications   PREDNISONE (DELTASONE) 20 MG TABLET    2 tabs po for 5 days, then 1 po for 5 days

## 2014-12-19 NOTE — Progress Notes (Signed)
Pre visit review using our clinic review tool, if applicable. No additional management support is needed unless otherwise documented below in the visit note. 

## 2015-01-01 ENCOUNTER — Other Ambulatory Visit: Payer: Self-pay | Admitting: *Deleted

## 2015-01-01 MED ORDER — BUPROPION HCL ER (XL) 150 MG PO TB24: 150.0000 mg | ORAL_TABLET | Freq: Every day | ORAL | Status: DC

## 2015-01-01 NOTE — Telephone Encounter (Signed)
Pt has not had f/u appt since CPE in 2014, acute only.

## 2015-01-01 NOTE — Telephone Encounter (Signed)
Ok to refill one time only.  Needs to be seen for further refills.

## 2015-03-04 ENCOUNTER — Other Ambulatory Visit: Payer: Self-pay

## 2015-03-04 NOTE — Telephone Encounter (Signed)
Pt left v/m requesting status of refill of wellbutrin to [facility] [address]. Pt seen 08/27/14 and in office note depression deteriorated.Please advise. No future appt scheduled.

## 2015-03-04 NOTE — Telephone Encounter (Signed)
Lm on pts vm requesting a call back 

## 2015-03-04 NOTE — Telephone Encounter (Signed)
Ok to refill one time only.  Please schedule 30 minute office visit in a few weeks to discuss and assess her symptoms.

## 2015-03-05 NOTE — Telephone Encounter (Signed)
Pt left v/m requesting cb about status of wellbutrin.

## 2015-03-05 NOTE — Telephone Encounter (Signed)
Lm on pts vm and informed her an OV is required, and must be scheduled for additional refills, per Dr Deborra Medina.

## 2015-04-29 ENCOUNTER — Ambulatory Visit: Payer: Commercial Indemnity | Admitting: Family Medicine

## 2015-05-07 ENCOUNTER — Ambulatory Visit (INDEPENDENT_AMBULATORY_CARE_PROVIDER_SITE_OTHER): Payer: Commercial Indemnity | Admitting: Family Medicine

## 2015-05-07 ENCOUNTER — Encounter: Payer: Self-pay | Admitting: Family Medicine

## 2015-05-07 VITALS — BP 154/92 | HR 74 | Temp 97.8°F | Wt 113.5 lb

## 2015-05-07 DIAGNOSIS — F418 Other specified anxiety disorders: Secondary | ICD-10-CM | POA: Diagnosis not present

## 2015-05-07 DIAGNOSIS — F32A Depression, unspecified: Secondary | ICD-10-CM

## 2015-05-07 DIAGNOSIS — F419 Anxiety disorder, unspecified: Principal | ICD-10-CM

## 2015-05-07 DIAGNOSIS — F329 Major depressive disorder, single episode, unspecified: Secondary | ICD-10-CM

## 2015-05-07 MED ORDER — DICLOFENAC SODIUM 75 MG PO TBEC: 75.0000 mg | DELAYED_RELEASE_TABLET | Freq: Two times a day (BID) | ORAL | Status: DC

## 2015-05-07 MED ORDER — LORAZEPAM 0.5 MG PO TABS: ORAL_TABLET | ORAL | Status: DC

## 2015-05-07 MED ORDER — BUPROPION HCL ER (XL) 150 MG PO TB24: 150.0000 mg | ORAL_TABLET | Freq: Every day | ORAL | Status: DC

## 2015-05-07 NOTE — Progress Notes (Signed)
Subjective:   Patient ID: [name] [name], female    DOB: [birth year], 54 y.o.   MRN: [id number]  [name] [name] is a pleasant 54 y.o. year old female who presents to clinic today with Follow-up and Headache  on 05/07/2015  HPI:  Anxiety/depression- had been stable on Wellbutrin 150 mg XL daily but ran out two months ago.  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   Uses prn ativan very rarely.   Sleeping ok.  Does get "tension headaches"- start in back of head and goes to top of head.  No visual changes, photo or phonophobia, nauesa or vomiting. Current Outpatient Prescriptions on File Prior to Visit  Medication Sig Dispense Refill  . diclofenac (VOLTAREN) 75 MG EC tablet Take 1 tablet (75 mg total) by mouth 2 (two) times daily. 60 tablet 3  . estradiol (VIVELLE-DOT) 0.075 MG/24HR Place 1 patch onto the skin 2 (two) times a week. 8 patch 11   No current facility-administered medications on file prior to visit.    Allergies  Allergen Reactions  . Codeine     REACTION: Nausea    Past Medical History  Diagnosis Date  . Depression   . HLD (hyperlipidemia)   . Tobacco abuse   . OA (osteoarthritis)   . Anxiety disorder   . COPD (chronic obstructive pulmonary disease)   . Fibroids     Uterine  . Vitiligo 1975    Past Surgical History  Procedure Laterality Date  . Abdominal hysterectomy  2011  . Ureter surgery  2011  . Mandible surgery  1980  . Hip arthroscopy  2003    Family History  Problem Relation Age of Onset  . Depression Mother   . Cancer Father   . Depression Father   . Miscarriages / Stillbirths Sister     History   Social History  . Marital Status: Married    Spouse Name: N/A  . Number of Children: 3  . 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 Smoking status: Current Every Day Smoker -- 1.50 packs/day for 38 years    Types:  Cigarettes  . Smokeless tobacco: Never Used  . Alcohol Use: No  . Drug Use: Yes     Comment: Marijuana  . Sexual Activity: Not Currently   Other Topics Concern  . Not on file   Social History Narrative   Lives with husband in [place] 1 1/2 PPD x 38 years      3 Engineer, civil (consulting) at [employer]      Daily caffeine use: 6-8      No alcohol      Illicit drug use-marijuana   The PMH, PSH, Social History, Family History, Medications, and allergies have been reviewed in [facility], and have been updated if relevant.   Review of Systems  Neurological: Positive for headaches. Negative for tremors, seizures, syncope, facial asymmetry, speech difficulty, weakness, light-headedness and numbness.  Psychiatric/Behavioral: Negative for suicidal ideas, hallucinations, behavioral problems, confusion, sleep disturbance, self-injury, dysphoric mood, decreased concentration and agitation. The patient is nervous/anxious. The patient is not hyperactive.   All other systems reviewed and are negative.      Objective:    BP 154/92 mmHg  Pulse 74  Temp(Src) 97.8 F (36.6 C) (Oral)  Wt 113 lb 8 oz (51.483 kg)  SpO2 97%   Physical Exam  Constitutional: She is oriented to person, place, and time. She appears well-developed and well-nourished. No distress.  HENT:  Head: Normocephalic.  Eyes: Conjunctivae are normal.  Cardiovascular: Normal rate.   Pulmonary/Chest: Effort normal.  Musculoskeletal: She exhibits no edema.  Neurological: She is alert and oriented to person, place, and time. No cranial nerve deficit.  Skin: Skin is warm and dry.  Psychiatric: She has a normal mood and affect. Her behavior is normal. Judgment and thought content normal.  Nursing note and vitals reviewed.         Assessment & Plan:   Anxiety and depression No Follow-up on file.

## 2015-05-07 NOTE — Progress Notes (Signed)
Pre visit review using our clinic review tool, if applicable. No additional management support is needed unless otherwise documented below in the visit note. 

## 2015-05-07 NOTE — Assessment & Plan Note (Signed)
Deteriorated. >25 minutes spent in face to face time with patient, >50% spent in counselling or coordination of care Ran out of rxs and is under more stress. Restart Wellbutrin- eRx sent.  Rx for ativan printed out and given to pt. Call or return to clinic prn if these symptoms worsen or fail to improve as anticipated. The patient indicates understanding of these issues and agrees with the plan.

## 2015-05-07 NOTE — Patient Instructions (Signed)
Good to see you. Hang in there.

## 2015-05-20 ENCOUNTER — Ambulatory Visit: Payer: Commercial Indemnity | Admitting: Family Medicine

## 2015-05-21 ENCOUNTER — Telehealth: Payer: Self-pay | Admitting: *Deleted

## 2015-05-21 NOTE — Telephone Encounter (Signed)
Mammogram f/u call: pt said she will scheduled one in the future due to other doctor appt. Pt declined to get phone # and said she will do it later

## 2015-05-29 ENCOUNTER — Encounter: Payer: Self-pay | Admitting: Family Medicine

## 2015-05-29 ENCOUNTER — Ambulatory Visit (INDEPENDENT_AMBULATORY_CARE_PROVIDER_SITE_OTHER): Payer: Commercial Indemnity | Admitting: Family Medicine

## 2015-05-29 ENCOUNTER — Ambulatory Visit (INDEPENDENT_AMBULATORY_CARE_PROVIDER_SITE_OTHER)
Admission: RE | Admit: 2015-05-29 | Discharge: 2015-05-29 | Disposition: A | Discharge status: Home / Self Care | Payer: Commercial Indemnity | Source: Ambulatory Visit | Attending: Family Medicine | Admitting: Family Medicine

## 2015-05-29 VITALS — BP 140/80 | HR 67 | Temp 97.9°F | Ht 59.0 in | Wt 113.8 lb

## 2015-05-29 DIAGNOSIS — H6983 Other specified disorders of Eustachian tube, bilateral: Secondary | ICD-10-CM | POA: Diagnosis not present

## 2015-05-29 DIAGNOSIS — M1611 Unilateral primary osteoarthritis, right hip: Secondary | ICD-10-CM

## 2015-05-29 DIAGNOSIS — J069 Acute upper respiratory infection, unspecified: Secondary | ICD-10-CM | POA: Diagnosis not present

## 2015-05-29 MED ORDER — TRAMADOL HCL 50 MG PO TABS: 50.0000 mg | ORAL_TABLET | Freq: Four times a day (QID) | ORAL | Status: DC | PRN

## 2015-05-29 NOTE — Progress Notes (Signed)
Pre visit review using our clinic review tool, if applicable. No additional management support is needed unless otherwise documented below in the visit note. 

## 2015-05-29 NOTE — Patient Instructions (Signed)
Eustachian Tube Dysfunction: There is a tube that connects between the sinuses and behind the ear called the "eustachian tube." Sometimes when you have allergies, a cold, or nasal congestion for any reason this tube can get blocked and pressure cannot equalize in your ears. (Like if you swim in deep water) This can also trap fluid behind the ear and give you a full, pressure-like sensation that is uncomfortable, but it is not an ear infection.  Recommendations: Afrin Nasal Spray: 2 sprays twice a day for a maximum of 3-4 days (longer than this and your nose gets addicted, and you have rebound swelling that makes it worse.) Sudafed: Either pseudoephedrine or phenylephrine. As directed on box. (Not is heart problems or high blood pressure) Anti-Histamine: Allegra, Zyrtec, or Claritin. All over the counter now and once a day.  Nasal steroid. Nasacort is over the counter now. About 10 prescription ones exist.  If you develop fever > 100.4, then things can change fluid behind the ear does increase your risk of developing an ear infection.  

## 2015-05-29 NOTE — Progress Notes (Signed)
Dr. Frederico Hamman T. Avelina Mcclurkin, MD, [facility] [place], [address] Phone: [phone] Fax: [phone]  05/29/2015  Patient: [name] [name], MRN: [id number], DOB: 1961-04-03, 54 y.o.  Primary Physician:  Arnette Norris, MD  Chief Complaint: Hip Pain; Sore Throat; and Ears feel muffled  Subjective:   [name] [name] is a 54 y.o. very pleasant female patient who presents with the following:  Sore throat. Since Saturday. ? Low grade fever, but has not taken a temp.  05/22/2015. Some muffled ear sounds, congestion, and some runny nose.   Right hip. 2013 h/o hip arthroscopy. Voltaren is not helping all that much. Taking Tylenol about every few hours.  She is on her feet all day when she is working at [employer], and it is bothering her quite a bit then.  She does have some pain with terminal movement.  She is primarily complaining of groin pain.  She is not having any significant back pain.  Past Medical History, Surgical History, Social History, Family History, Problem List, Medications, and Allergies have been reviewed and updated if relevant.  Patient Active Problem List   Diagnosis Date Noted  . Cough 08/27/2014  . Left shoulder pain 08/27/2014  . Depression 08/27/2014  . Right hip pain 06/29/2014  . Nausea with vomiting 06/29/2014  . Vitiligo 04/12/2012  . Surgical menopause 06/16/2011  . MAMMOGRAM, ABNORMAL, LEFT 02/17/2010  . HYPERLIPIDEMIA 02/06/2010  . TOBACCO ABUSE 02/06/2010  . Anxiety and depression 02/06/2010  . OSTEOARTHRITIS 02/06/2010    Past Medical History  Diagnosis Date  . Depression   . HLD (hyperlipidemia)   . Tobacco abuse   . OA (osteoarthritis)   . Anxiety disorder   . COPD (chronic obstructive pulmonary disease)   . Fibroids     Uterine  . Vitiligo 1975    Past Surgical History  Procedure Laterality Date  . Abdominal hysterectomy  2011  . Ureter surgery  2011  . Mandible surgery  1980    . Hip arthroscopy  2003    Social History   Social History  . Marital Status: Married    Spouse Name: N/A  . Number of Children: 3  . 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 Smoking status: Current Every Day Smoker -- 1.50 packs/day for 38 years    Types: Cigarettes  . Smokeless tobacco: Never Used  . Alcohol Use: No  . Drug Use: Yes     Comment: Marijuana  . Sexual Activity: Not Currently   Other Topics Concern  . Not on file   Social History Narrative   Lives with husband in [place] 1 1/2 PPD x 38 years      3 Engineer, civil (consulting) at [employer]      Daily caffeine use: 6-8      No alcohol      Illicit drug use-marijuana    Family History  Problem Relation Age of Onset  . Depression Mother   . Cancer Father   . Depression Father   . Miscarriages / Stillbirths Sister     Allergies  Allergen Reactions  . Codeine     REACTION: Nausea    Medication list reviewed and updated in full in [facility].  GEN: No fevers, chills. Nontoxic. Primarily MSK c/o today. MSK: Detailed in the HPI GI: tolerating  PO intake without difficulty Neuro: No numbness, parasthesias, or tingling associated. Otherwise the pertinent positives of the ROS are noted above.   Objective:   BP 140/80 mmHg  Pulse 67  Temp(Src) 97.9 F (36.6 C) (Oral)  Ht 4\' 11"  (1.499 m)  Wt 113 lb 12 oz (51.597 kg)  BMI 22.96 kg/m2   Gen: WDWN, NAD; A & O x3, cooperative. Pleasant.Globally Non-toxic HEENT: Normocephalic and atraumatic. Throat clear, w/o exudate, R TM clear, fluid, L TM - good landmarks, + clear fluid present. rhinnorhea.  MMM Frontal sinuses: NT Max sinuses: NT NECK: Anterior cervical  LAD is absent CV: RRR, No M/G/R, cap refill <2 sec PULM: Breathing comfortably in no respiratory distress. no wheezing, crackles, rhonchi EXT: No c/c/e PSYCH: Friendly, good eye contact MSK: Nml gait  HIP  EXAM: SIDE: R ROM: Abduction, Flexion, Internal and External range of motion: approximate 40% loss abduction compared to the contralateral side.  The patient maintains approximately 45 of internal and external range of motion in total.  There is pain with terminal motion Pain with terminal IROM and EROM: yes GTB: NT SLR: NEG Knees: No effusion FABER: pain REVERSE FABER: NT, neg Piriformis: NT at direct palpation Str: flexion: 4++/5 abduction: 4+/5 adduction: 5/5 Strength testing non-tender     Radiology: Dg Hip Unilat W Or W/o Pelvis 2-3 Views Right  05/30/2015   CLINICAL DATA:  Clinical osteoarthritis of the right hip  EXAM: DG HIP (WITH OR WITHOUT PELVIS) 2-3V RIGHT  COMPARISON:  Right hip series of December 12, 2012  FINDINGS: The bony pelvis is adequately mineralized. The observed portions of the sacrum are unremarkable. There is moderate narrowing of the right hip joint space. There are large osteophytes arising from the superior articular margin of the femoral head. The acetabulum and femoral head remains smoothly rounded however. The femoral neck and intertrochanteric regions are intact.  IMPRESSION: Chronic degenerative change of the right hip which has progressed somewhat since the 2014 study. There is no acute bony abnormality.   Electronically Signed   By: David  Martinique M.D.   On: 05/30/2015 08:17     Assessment and Plan:   Primary osteoarthritis of right hip - Plan: DG HIP UNILAT W OR W/O PELVIS 2-3 VIEWS RIGHT, DG FLUORO GUIDE NDL PLCD/BX/INJ/LOC  Eustachian tube dysfunction, bilateral  URI (upper respiratory infection)  Primary source of pain is right-sided hip osteoarthritis, which is significant and at least moderate in degree.  She does not appear to have bone-on-bone arthritis currently.  She is failing oral anti-inflammatories, oral Tylenol.  I'm and give her some tramadol p.r.n. To use additionally.  I'm going to set her up for a fluoroscopic guided intra-articular  right-sided hip injection to see if this helps with her symptoms.  Follow-up: prn  New Prescriptions   TRAMADOL (ULTRAM) 50 MG TABLET    Take 1 tablet (50 mg total) by mouth every 6 (six) hours as needed.   Orders Placed This Encounter  Procedures  . DG HIP UNILAT W OR W/O PELVIS 2-3 VIEWS RIGHT  . DG FLUORO GUIDE NDL PLCD/BX/INJ/LOC   Patient Instructions  Eustachian Tube Dysfunction: There is a tube that connects between the sinuses and behind the ear called the "eustachian tube." Sometimes when you have allergies, a cold, or nasal congestion for any reason this tube can get blocked and pressure cannot equalize in your ears. (Like if you swim in deep water) This can also trap fluid behind the ear and give you a full,  pressure-like sensation that is uncomfortable, but it is not an ear infection.  Recommendations: Afrin Nasal Spray: 2 sprays twice a day for a maximum of 3-4 days (longer than this and your nose gets addicted, and you have rebound swelling that makes it worse.) Sudafed: Either pseudoephedrine or phenylephrine. As directed on box. (Not is heart problems or high blood pressure) Anti-Histamine: Allegra, Zyrtec, or Claritin. All over the counter now and once a day.  Nasal steroid. Nasacort is over the counter now. About 10 prescription ones exist.  If you develop fever > 100.4, then things can change fluid behind the ear does increase your risk of developing an ear infection.      Signed,  Maud Deed. Brockton Mckesson, MD   Patient's Medications  New Prescriptions   TRAMADOL (ULTRAM) 50 MG TABLET    Take 1 tablet (50 mg total) by mouth every 6 (six) hours as needed.  Previous Medications   BUPROPION (WELLBUTRIN XL) 150 MG 24 HR TABLET    Take 1 tablet (150 mg total) by mouth daily.   DICLOFENAC (VOLTAREN) 75 MG EC TABLET    Take 1 tablet (75 mg total) by mouth 2 (two) times daily.   ESTRADIOL (VIVELLE-DOT) 0.075 MG/24HR    Place 1 patch onto the skin 2 (two) times a week.    NICOTINE (NICODERM CQ - DOSED IN MG/24 HOURS) 21 MG/24HR PATCH    Place 21 mg onto the skin daily.  Modified Medications   Modified Medication Previous Medication   LORAZEPAM (ATIVAN) 0.5 MG TABLET LORazepam (ATIVAN) 0.5 MG tablet      TAKE ONE TABLET BY MOUTH EVERY 8 HOURS AS NEEDED    TAKE ONE TABLET BY MOUTH EVERY 8 HOURS AS NEEDED  Discontinued Medications   No medications on file

## 2015-05-31 ENCOUNTER — Telehealth: Payer: Self-pay | Admitting: Family Medicine

## 2015-05-31 ENCOUNTER — Other Ambulatory Visit: Payer: Self-pay | Admitting: *Deleted

## 2015-05-31 DIAGNOSIS — Z780 Asymptomatic menopausal state: Secondary | ICD-10-CM

## 2015-05-31 MED ORDER — LORAZEPAM 0.5 MG PO TABS: ORAL_TABLET | ORAL | Status: DC

## 2015-05-31 NOTE — Telephone Encounter (Signed)
Last f/u appt 04/2015 

## 2015-05-31 NOTE — Telephone Encounter (Signed)
Pt called requesting a refill on Vivelle along with request to pharmacy. Request was denied and pt informed she needs to make an appt for a rx refill since she hasn't been seen since 2013. Pt states that Dr. Kennon Rounds knows her, and she doesn't need an appt and that she would like her medication to be refilled. Pt uses [facility] in [place] on [address]. Please advise.

## 2015-06-03 MED ORDER — ESTRADIOL 0.075 MG/24HR TD PTTW: 1.0000 | MEDICATED_PATCH | TRANSDERMAL | Status: DC

## 2015-06-03 NOTE — Telephone Encounter (Signed)
Rx called in to requested pharmacy 

## 2015-06-03 NOTE — Telephone Encounter (Signed)
Please review request for refill of Vivelle.

## 2015-06-07 NOTE — Telephone Encounter (Signed)
Pt walked in to [facility] very upset that the estradiol was not at [facility] [address] Dr Kennon Rounds approved the refill on 06/03/15 but does not appear med called in to pharmacy. Unable to reach Dr Virginia Crews office; pt said the office is closed today. Gina RN team lead confirmed in this circumstance OK to call med in for pt. Spoke with Margarita Grizzle at [address] and Medication phoned to pharmacy as instructed. Pt appreciative and will ck with pharmacy later today.

## 2015-06-10 ENCOUNTER — Ambulatory Visit
Admission: RE | Admit: 2015-06-10 | Discharge: 2015-06-10 | Disposition: A | Discharge status: Home / Self Care | Payer: Managed Care, Other (non HMO) | Source: Ambulatory Visit | Attending: Family Medicine | Admitting: Family Medicine

## 2015-06-10 DIAGNOSIS — M1611 Unilateral primary osteoarthritis, right hip: Secondary | ICD-10-CM

## 2015-06-10 MED ORDER — IOHEXOL 180 MG/ML  SOLN
1.0000 mL | Freq: Once | INTRAMUSCULAR | Status: DC | PRN
  Administered 2015-06-10: 1 mL via INTRA_ARTICULAR

## 2015-06-10 MED ORDER — METHYLPREDNISOLONE ACETATE 40 MG/ML INJ SUSP (RADIOLOG
120.0000 mg | Freq: Once | INTRAMUSCULAR | Status: AC
  Administered 2015-06-10: 120 mg via INTRA_ARTICULAR

## 2015-08-13 ENCOUNTER — Telehealth: Payer: Self-pay

## 2015-08-13 NOTE — Telephone Encounter (Signed)
Pt left v/m; pt request stronger med for hip pain; pt taking 2 tramadol q 6-8 hours for pain and not effective to help pain. Pt last seen 05/29/15 for hip pain. Pain level now is 5. [place] Pt does not want to schedule appt; just wants different pain med. Pt request cb.

## 2015-08-14 NOTE — Telephone Encounter (Signed)
i will discuss with my partner - fairly advanced hip OA, but not severe.   Trial of prn narcotics reasonable in this case, but I will discuss with PCP prior to starting.

## 2015-08-14 NOTE — Telephone Encounter (Signed)
Left message for [name] [name] to call the office and scheduled appointment to be reevaluated with Dr. Lorelei Pont.  Needs to be reevaluated before stronger pain medication can be given per Dr. Lorelei Pont.

## 2015-08-14 NOTE — Telephone Encounter (Addendum)
Spoke to patient.  She was able to retrieve her message.  She had already called back and scheduled appointment for Monday 08/19/2015 at 12:00 pm with Dr. Lorelei Pont.

## 2015-08-14 NOTE — Telephone Encounter (Addendum)
Pt left v/m; pt having difficulty retrieving v/m and request cb on home phone.

## 2015-08-14 NOTE — Telephone Encounter (Signed)
Can you get her to come in to talk about it? Fairly complex

## 2015-08-19 ENCOUNTER — Encounter: Payer: Self-pay | Admitting: Family Medicine

## 2015-08-19 ENCOUNTER — Ambulatory Visit (INDEPENDENT_AMBULATORY_CARE_PROVIDER_SITE_OTHER): Payer: Managed Care, Other (non HMO) | Admitting: Family Medicine

## 2015-08-19 VITALS — BP 118/80 | HR 59 | Temp 97.7°F | Ht 59.0 in | Wt 115.2 lb

## 2015-08-19 DIAGNOSIS — M1611 Unilateral primary osteoarthritis, right hip: Secondary | ICD-10-CM | POA: Diagnosis not present

## 2015-08-19 MED ORDER — DICLOFENAC SODIUM 1 % TD GEL: TRANSDERMAL | Status: DC

## 2015-08-19 NOTE — Progress Notes (Signed)
Pre visit review using our clinic review tool, if applicable. No additional management support is needed unless otherwise documented below in the visit note. 

## 2015-08-19 NOTE — Patient Instructions (Signed)

## 2015-08-20 NOTE — Progress Notes (Signed)
Dr. Frederico Hamman T. Efe Fazzino, MD, [facility] [place], [address] Phone: [phone] Fax: [phone]  08/19/2015  Patient: [name] [name], MRN: [id number], DOB: [birth year], 54 y.o.  Primary Physician:  Arnette Norris, MD  Chief Complaint: Hip Pain  Subjective:   [name] [name] is a 54 y.o. very pleasant female patient who presents with the following:  The patient is here in follow-up regarding her right hip.  She had a fluoroscopic guided dexamethasone injection in August, and she had relief of symptoms for only about 6 weeks.  Now she is limping all the time, she is taking tramadol, NSAIDs, and really having a hard time.  05/29/2015 Last OV with Owens Loffler, MD  Sore throat. Since Saturday. ? Low grade fever, but has not taken a temp.  05/22/2015. Some muffled ear sounds, congestion, and some runny nose.   Right hip. 2013 h/o hip arthroscopy. Voltaren is not helping all that much. Taking Tylenol about every few hours.  She is on her feet all day when she is working at [employer], and it is bothering her quite a bit then.  She does have some pain with terminal movement.  She is primarily complaining of groin pain.  She is not having any significant back pain.  Past Medical History, Surgical History, Social History, Family History, Problem List, Medications, and Allergies have been reviewed and updated if relevant.  Patient Active Problem List   Diagnosis Date Noted  . Cough 08/27/2014  . Left shoulder pain 08/27/2014  . Depression 08/27/2014  . Right hip pain 06/29/2014  . Nausea with vomiting 06/29/2014  . Vitiligo 04/12/2012  . Surgical menopause 06/16/2011  . MAMMOGRAM, ABNORMAL, LEFT 02/17/2010  . HYPERLIPIDEMIA 02/06/2010  . TOBACCO ABUSE 02/06/2010  . Anxiety and depression 02/06/2010  . OSTEOARTHRITIS 02/06/2010    Past Medical History  Diagnosis Date  . Depression   . HLD (hyperlipidemia)   . Tobacco  abuse   . OA (osteoarthritis)   . Anxiety disorder   . COPD (chronic obstructive pulmonary disease) ([place])   . Fibroids     Uterine  . Vitiligo 1975    Past Surgical History  Procedure Laterality Date  . Abdominal hysterectomy  2011  . Ureter surgery  2011  . Mandible surgery  1980  . Hip arthroscopy  2003    Social History   Social History  . Marital Status: Married    Spouse Name: N/A  . Number of Children: 3  . 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 Smoking status: Current Every Day Smoker -- 1.50 packs/day for 38 years    Types: Cigarettes  . Smokeless tobacco: Never Used  . Alcohol Use: No  . Drug Use: Yes     Comment: Marijuana  . Sexual Activity: Not Currently   Other Topics Concern  . Not on file   Social History Narrative   Lives with husband in [place] 1 1/2 PPD x 38 years      3 Engineer, civil (consulting) at [employer]      Daily caffeine use: 6-8      No alcohol      Illicit drug use-marijuana    Family History  Problem Relation Age of Onset  . Depression Mother   . Cancer Father   . Depression Father   .  Miscarriages / Stillbirths Sister     Allergies  Allergen Reactions  . Codeine     REACTION: Nausea    Medication list reviewed and updated in full in [place].  GEN: No fevers, chills. Nontoxic. Primarily MSK c/o today. MSK: Detailed in the HPI GI: tolerating PO intake without difficulty Neuro: No numbness, parasthesias, or tingling associated. Otherwise the pertinent positives of the ROS are noted above.   Objective:   BP 118/80 mmHg  Pulse 59  Temp(Src) 97.7 F (36.5 C) (Oral)  Ht 4\' 11"  (1.499 m)  Wt 115 lb 4 oz (52.277 kg)  BMI 23.27 kg/m2   Gen: WDWN, NAD; A & O x3, cooperative. Pleasant.Globally Non-toxic HEENT: Normocephalic and atraumatic. Throat clear, w/o exudate, R TM clear, fluid, L TM - good landmarks, + clear fluid present.  rhinnorhea.  MMM Frontal sinuses: NT Max sinuses: NT NECK: Anterior cervical  LAD is absent CV: RRR, No M/G/R, cap refill <2 sec PULM: Breathing comfortably in no respiratory distress. no wheezing, crackles, rhonchi EXT: No c/c/e PSYCH: Friendly, good eye contact MSK: Nml gait  HIP EXAM: SIDE: R ROM: Abduction, Flexion, Internal and External range of motion: patient has 15 deg of abduction.  The patient maintains approximately 20 deg of internal and external range of motion in total.  There is pain with terminal motion Pain with terminal IROM and EROM: yes GTB: NT SLR: NEG Knees: No effusion FABER: pain REVERSE FABER: NT, neg Piriformis: NT at direct palpation Str: flexion: 4++/5 abduction: 4+/5 adduction: 5/5 Strength testing non-tender     Radiology: Dg Fluoro Guide Ndl Plcd/bx/inj/loc  06/10/2015  CLINICAL DATA:  Right hip osteoarthritis.  Hip pain. EXAM: RIGHT HIP INJECTION UNDER FLUOROSCOPY FLUOROSCOPY TIME:  Radiation Exposure Index (as provided by the fluoroscopic device): 0.0522 Gy*cm^2 PROCEDURE: Overlying skin prepped with Betadine, draped in the usual sterile fashion, and infiltrated locally with buffered Lidocaine. A 3.5 inch, 22 gauge spinal needle was advanced to the lateral aspect of the right femoral head- neck junction. Diagnostic injection of iodinated contrast demonstrates intra-articular spread without intravascular component. 120 mg Depo-Medrol and 3 mL of 1% lidocaine were then administered. No immediate complication. IMPRESSION: Technically successful right hip injection under fluoroscopy. Electronically Signed   By: Logan Bores M.D.   On: 06/10/2015 08:18   DG HIP (WITH OR WITHOUT PELVIS) 2-3V RIGHT  COMPARISON: Right hip series of December 12, 2012  FINDINGS: The bony pelvis is adequately mineralized. The observed portions of the sacrum are unremarkable. There is moderate narrowing of the right hip joint space. There are large osteophytes arising from  the superior articular margin of the femoral head. The acetabulum and femoral head remains smoothly rounded however. The femoral neck and intertrochanteric regions are intact.  IMPRESSION: Chronic degenerative change of the right hip which has progressed somewhat since the 2014 study. There is no acute bony abnormality.   Electronically Signed  By: David Martinique M.D.  On: 05/30/2015 08:17   Assessment and Plan:   Primary osteoarthritis of right hip - Plan: Ambulatory referral to Orthopedic Surgery  Her examination has progressed, and she has marketed loss of motion and pain with minimal functional utility of the right hip.  I think at this point nothing short of the total hip arthroplasty will help the patient.  Cont NSAIDS, Tylenol, and try voltaren gel  We are going to consult [facility] orthopedics for their expertise.  Follow-up: prn  New Prescriptions   DICLOFENAC SODIUM (VOLTAREN) 1 % GEL  Apply QID as directed   Orders Placed This Encounter  Procedures  . Ambulatory referral to Orthopedic Surgery   Patient Instructions  REFERRALS TO SPECIALISTS, SPECIAL TESTS (MRI, CT, ULTRASOUNDS)  [facility] or [facility] will help you. ASK CHECK-IN FOR HELP.  Imaging / Special Testing referrals sometimes can be done same day if EMERGENCY, but others can take 2 or 3 days to get an appointment. Starting in 2015, many of the new Medicare plans and Obamacare plans take much longer.   Specialist appointment times vary a great deal, based on their schedule / openings. -- Some specialists have very long wait times. (Example. Dermatology. Multiple months  for non-cancer)       Signed,  Pradeep Beaubrun T. Saliyah Gillin, MD   Patient's Medications  New Prescriptions   DICLOFENAC SODIUM (VOLTAREN) 1 % GEL    Apply QID as directed  Previous Medications   BUPROPION (WELLBUTRIN XL) 150 MG 24 HR TABLET    Take 1 tablet (150 mg total) by mouth daily.   DICLOFENAC (VOLTAREN) 75 MG EC TABLET     Take 1 tablet (75 mg total) by mouth 2 (two) times daily.   ESTRADIOL (VIVELLE-DOT) 0.075 MG/24HR    Place 1 patch onto the skin 2 (two) times a week.   LORAZEPAM (ATIVAN) 0.5 MG TABLET    TAKE ONE TABLET BY MOUTH EVERY 8 HOURS AS NEEDED   NICOTINE (NICODERM CQ - DOSED IN MG/24 HOURS) 21 MG/24HR PATCH    Place 21 mg onto the skin daily.   TRAMADOL (ULTRAM) 50 MG TABLET    Take 1 tablet (50 mg total) by mouth every 6 (six) hours as needed.  Modified Medications   No medications on file  Discontinued Medications   No medications on file

## 2015-09-10 ENCOUNTER — Telehealth: Payer: Self-pay | Admitting: *Deleted

## 2015-09-10 NOTE — Telephone Encounter (Signed)
Form received indicating pt scheduled to have R hip surgery. LM on pts vm and informed her, per Dr Deborra Medina OV required

## 2015-09-16 ENCOUNTER — Ambulatory Visit (INDEPENDENT_AMBULATORY_CARE_PROVIDER_SITE_OTHER): Payer: Managed Care, Other (non HMO) | Admitting: Family Medicine

## 2015-09-16 ENCOUNTER — Encounter: Payer: Self-pay | Admitting: Family Medicine

## 2015-09-16 VITALS — BP 122/78 | HR 61 | Temp 97.5°F | Wt 113.0 lb

## 2015-09-16 DIAGNOSIS — Z01810 Encounter for preprocedural cardiovascular examination: Secondary | ICD-10-CM | POA: Diagnosis not present

## 2015-09-16 DIAGNOSIS — Z23 Encounter for immunization: Secondary | ICD-10-CM

## 2015-09-16 DIAGNOSIS — Z01818 Encounter for other preprocedural examination: Secondary | ICD-10-CM

## 2015-09-16 MED ORDER — NICOTINE 21 MG/24HR TD PT24: 21.0000 mg | MEDICATED_PATCH | Freq: Every day | TRANSDERMAL | Status: DC

## 2015-09-16 MED ORDER — LORAZEPAM 0.5 MG PO TABS: ORAL_TABLET | ORAL | Status: DC

## 2015-09-16 NOTE — Progress Notes (Signed)
Pre visit review using our clinic review tool, if applicable. No additional management support is needed unless otherwise documented below in the visit note. 

## 2015-09-16 NOTE — Addendum Note (Signed)
Addended by: Lucille Passy on: 09/16/2015 11:22 AM   Modules accepted: Orders

## 2015-09-16 NOTE — Progress Notes (Signed)
Subjective:    [name] [name] is a 54 y.o. female who presents to the office today for a preoperative consultation at the request of surgeon Rod Can who plans on performing right THA-AA pending clearance. Planned anesthesia: general. The patient has the following known anesthesia issues: none. Patients bleeding risk: no recent abnormal bleeding. Patient does not have objections to receiving blood products if needed.  The following portions of the patient's history were reviewed and updated as appropriate: allergies, current medications, past family history, past medical history, past social history, past surgical history and problem list.  Review of Systems Pertinent items are noted in HPI.   Current Outpatient Prescriptions on File Prior to Visit  Medication Sig Dispense Refill  . buPROPion (WELLBUTRIN XL) 150 MG 24 hr tablet Take 1 tablet (150 mg total) by mouth daily. 90 tablet 6  . diclofenac (VOLTAREN) 75 MG EC tablet Take 1 tablet (75 mg total) by mouth 2 (two) times daily. 60 tablet 3  . diclofenac sodium (VOLTAREN) 1 % GEL Apply QID as directed 5 Tube prn  . estradiol (VIVELLE-DOT) 0.075 MG/24HR Place 1 patch onto the skin 2 (two) times a week. 8 patch 11  . LORazepam (ATIVAN) 0.5 MG tablet TAKE ONE TABLET BY MOUTH EVERY 8 HOURS AS NEEDED 60 tablet 0  . nicotine (NICODERM CQ - DOSED IN MG/24 HOURS) 21 mg/24hr patch Place 21 mg onto the skin daily.    . traMADol (ULTRAM) 50 MG tablet Take 1 tablet (50 mg total) by mouth every 6 (six) hours as needed. 40 tablet 2   No current facility-administered medications on file prior to visit.    Allergies  Allergen Reactions  . Codeine     REACTION: Nausea    Past Medical History  Diagnosis Date  . Depression   . HLD (hyperlipidemia)   . Tobacco abuse   . OA (osteoarthritis)   . Anxiety disorder   . COPD (chronic obstructive pulmonary disease) ([place])   . Fibroids     Uterine  . Vitiligo 1975    Past Surgical History   Procedure Laterality Date  . Abdominal hysterectomy  2011  . Ureter surgery  2011  . Mandible surgery  1980  . Hip arthroscopy  2003    Family History  Problem Relation Age of Onset  . Depression Mother   . Cancer Father   . Depression Father   . Miscarriages / [place] Sister     Social History   Social History  . Marital Status: Married    Spouse Name: N/A  . Number of Children: 3  . 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 Smoking status: Current Every Day Smoker -- 1.50 packs/day for 38 years    Types: Cigarettes  . Smokeless tobacco: Never Used  . Alcohol Use: No  . Drug Use: Yes     Comment: Marijuana  . Sexual Activity: Not Currently   Other Topics Concern  . Not on file   Social History Narrative   Lives with husband in [place] 1 1/2 PPD x 38 years      3 Engineer, civil (consulting) at [employer]      Daily caffeine use: 6-8      No alcohol      Illicit drug use-marijuana   The PMH, PSH, Social History, Family History, Medications, and allergies have been reviewed  in [facility], and have been updated if relevant.   Objective:   ,BP 122/78 mmHg  Pulse 61  Temp(Src) 97.5 F (36.4 C) (Oral)  Wt 113 lb (51.256 kg)  SpO2 97%  General:  Well-developed,well-nourished,in no acute distress; alert,appropriate and cooperative throughout examination Head:  normocephalic and atraumatic.   Eyes:  vision grossly intact, pupils equal, pupils round, and pupils reactive to light.   Ears:  R ear normal and L ear normal.   Nose:  no external deformity.   Mouth:  good dentition.     Lungs:  Normal respiratory effort, chest expands symmetrically. Lungs are clear to auscultation, no crackles or wheezes. Heart:  Normal rate and regular rhythm. S1 and S2 normal without gallop, murmur, click, rub or other extra sounds. Abdomen:  Bowel sounds positive,abdomen soft and non-tender without masses,  organomegaly or hernias noted. Msk:  No deformity or scoliosis noted of thoracic or lumbar spine.   Extremities:  No clubbing, cyanosis, edema, or deformity noted with normal full range of motion of all joints.   Neurologic:  alert & oriented X3 and gait normal.   Skin:  Intact without suspicious lesions or rashes Cervical Nodes:  No lymphadenopathy noted Axillary Nodes:  No palpable lymphadenopathy Psych:  Cognition and judgment appear intact. Alert and cooperative with normal attention span and concentration. No apparent delusions, illusions, hallucinations    Cardiographics ECG: normal sinus rhythm, no blocks or conduction defects, no ischemic changes    Assessment:      54 y.o. female with planned surgery as above.    Difficulty with intubation is not anticipated.  Cardiac Risk Estimation: low     Plan:   Patient is cleared for surgery. Form signed and faxed to Dr. Lyla Glassing.

## 2015-09-18 ENCOUNTER — Telehealth: Payer: Self-pay | Admitting: Family Medicine

## 2015-09-18 NOTE — Telephone Encounter (Signed)
Pt dropped off FMLA paperwork to be filled out She stated her work is scheduling her for 9-10 hours daily and she has told them due to her hip pain she cannot work more than 8 hours daily She also is going to dentist today and they will be referring her to oral surgeon She wanted you to do her fmla for that.  She is going to have 2-4 teeth removed and some roots under her gum. And would probably be out of work 3-4 days with this.  I ask pt if she was going to ask oral surgeon to do this FMLA  She stated she wanted dr Deborra Medina to   In dr aron's IN BOX For review and signature

## 2015-09-19 DIAGNOSIS — Z7689 Persons encountering health services in other specified circumstances: Secondary | ICD-10-CM

## 2015-09-19 NOTE — Telephone Encounter (Signed)
Oral surgeon needs to do that.

## 2015-09-19 NOTE — Telephone Encounter (Signed)
Form signed.  Please complete remainder based on her request detailed below.

## 2015-09-19 NOTE — Telephone Encounter (Signed)
Do you want me to flma for having her teeth pulled or should oral surgeon do that?  She didn't have an appointment yesterday when she came in to drop off paper work

## 2015-09-25 NOTE — Telephone Encounter (Signed)
Left message letting pt know paperwork is ready for pick up. Also let pt know that paperwork was not faxed in she would need to turn this in her self Copy for pt Copy for scan Copy for billing Copy for file

## 2015-11-13 ENCOUNTER — Telehealth: Payer: Self-pay

## 2015-11-13 NOTE — Telephone Encounter (Signed)
Pt left v/m pt has been going to [place] to get scheduled for hip replacement; pt has done everything ortho doctor has asked her to except stop smoking; pt said ortho doctor advised pt needs to be nicotine free (pt cannot use nicotine patch either) for 2 weeks prior to surgery. Pt does not know what to do and request referral to different orthopedic doctor. Pt request cb.

## 2015-11-13 NOTE — Telephone Encounter (Signed)
[name] [name], can you please help with this.

## 2015-11-14 NOTE — Telephone Encounter (Signed)
Referral placed.  Please enter as refill request.

## 2015-11-14 NOTE — Telephone Encounter (Signed)
Pt left another vm. Requesting refill on Lorazapm.  Also please enter new ortho. 2nd opinion. Pt wants to go to [facility]/Dr. Ninfa Linden. Please advise

## 2015-11-19 ENCOUNTER — Other Ambulatory Visit: Payer: Self-pay | Admitting: Family Medicine

## 2015-11-19 NOTE — Telephone Encounter (Signed)
Last f/u 04/2015 

## 2015-11-20 ENCOUNTER — Other Ambulatory Visit: Payer: Self-pay | Admitting: Family Medicine

## 2015-11-20 NOTE — Telephone Encounter (Signed)
Rx called in to requested pharmacy 

## 2015-12-17 ENCOUNTER — Other Ambulatory Visit: Payer: Self-pay | Admitting: Physician Assistant

## 2015-12-19 NOTE — Progress Notes (Signed)
Abbreviations noted per surgical consent. Please clarify. Patient has preop appt scheduled for 12/20/2015. Thanks.

## 2015-12-20 ENCOUNTER — Encounter (HOSPITAL_COMMUNITY): Payer: Self-pay

## 2015-12-20 ENCOUNTER — Encounter (HOSPITAL_COMMUNITY)
Admission: RE | Admit: 2015-12-20 | Discharge: 2015-12-20 | Disposition: A | Discharge status: Home / Self Care | Payer: Managed Care, Other (non HMO) | Source: Ambulatory Visit | Attending: Orthopaedic Surgery | Admitting: Orthopaedic Surgery

## 2015-12-20 DIAGNOSIS — Z01812 Encounter for preprocedural laboratory examination: Secondary | ICD-10-CM | POA: Diagnosis not present

## 2015-12-20 DIAGNOSIS — Z0183 Encounter for blood typing: Secondary | ICD-10-CM | POA: Insufficient documentation

## 2015-12-20 DIAGNOSIS — M1611 Unilateral primary osteoarthritis, right hip: Secondary | ICD-10-CM | POA: Diagnosis not present

## 2015-12-20 HISTORY — DX: Anxiety disorder, unspecified: F41.9

## 2015-12-20 HISTORY — DX: Presence of spectacles and contact lenses: Z97.3

## 2015-12-20 LAB — PROTIME-INR
INR: 1.09 (ref 0.00–1.49)
Prothrombin Time: 13.9 seconds (ref 11.6–15.2)

## 2015-12-20 LAB — CBC
HEMATOCRIT: 43.3 % (ref 36.0–46.0)
HEMOGLOBIN: 14.3 g/dL (ref 12.0–15.0)
MCH: 30.6 pg (ref 26.0–34.0)
MCHC: 33 g/dL (ref 30.0–36.0)
MCV: 92.7 fL (ref 78.0–100.0)
Platelets: 164 10*3/uL (ref 150–400)
RBC: 4.67 MIL/uL (ref 3.87–5.11)
RDW: 13.2 % (ref 11.5–15.5)
WBC: 5.3 10*3/uL (ref 4.0–10.5)

## 2015-12-20 LAB — COMPREHENSIVE METABOLIC PANEL
ALBUMIN: 4.1 g/dL (ref 3.5–5.0)
ALT: 14 U/L (ref 14–54)
ANION GAP: 8 (ref 5–15)
AST: 17 U/L (ref 15–41)
Alkaline Phosphatase: 55 U/L (ref 38–126)
BILIRUBIN TOTAL: 0.5 mg/dL (ref 0.3–1.2)
BUN: 13 mg/dL (ref 6–20)
CO2: 28 mmol/L (ref 22–32)
Calcium: 9.3 mg/dL (ref 8.9–10.3)
Chloride: 107 mmol/L (ref 101–111)
Creatinine, Ser: 0.87 mg/dL (ref 0.44–1.00)
GFR calc Af Amer: >60 mL/min (ref >60)
GFR calc non Af Amer: >60 mL/min (ref >60)
GLUCOSE: 81 mg/dL (ref 65–99)
POTASSIUM: 4.7 mmol/L (ref 3.5–5.1)
Sodium: 143 mmol/L (ref 135–145)
TOTAL PROTEIN: 6.7 g/dL (ref 6.5–8.1)

## 2015-12-20 LAB — SURGICAL PCR SCREEN
MRSA, PCR: NEGATIVE
Staphylococcus aureus: NEGATIVE

## 2015-12-20 LAB — ABO/RH: ABO/RH(D): AB POS

## 2015-12-20 NOTE — Progress Notes (Signed)
EKG/epic 09/16/2015

## 2015-12-20 NOTE — Patient Instructions (Signed)
LANY DEVAUL  12/20/2015   Your procedure is scheduled on: Friday December 27, 2015  Report to [facility]  Entrance take [place]  elevators to 3rd floor to  [place] at 10:00 AM.  Call this number if you have problems the morning of surgery [phone]   Remember: ONLY 1 PERSON MAY GO WITH YOU TO SHORT STAY TO GET  READY MORNING OF [place].  Do not eat food or drink liquids :After Midnight.     Take these medicines the morning of surgery with A SIP OF WATER: Bupropion (Wellbutrin); Loratadine (Claritin) if needed; Lorazepam (Ativan) if needed                               You may not have any metal on your body including hair pins and              piercings  Do not wear jewelry, make-up, lotions, powders or perfumes, deodorant             Do not wear nail polish.  Do not shave  48 hours prior to surgery.              Do not bring valuables to the hospital. [place].  Contacts, dentures or bridgework may not be worn into surgery.  Leave suitcase in the car. After surgery it may be brought to your room.    Special Instructions: NO TOBACCO, DRUG OR ALCOHOL USE DAY OF SURGERY.               Please read over the following fact sheets you were given:MRSA INFORMATION SHEET  _____________________________________________________________________             [facility] - Preparing for Surgery Before surgery, you can play an important role.  Because skin is not sterile, your skin needs to be as free of germs as possible.  You can reduce the number of germs on your skin by washing with CHG (chlorahexidine gluconate) soap before surgery.  CHG is an antiseptic cleaner which kills germs and bonds with the skin to continue killing germs even after washing. Please DO NOT use if you have an allergy to CHG or antibacterial soaps.  If your skin becomes reddened/irritated stop using the CHG and inform your nurse  when you arrive at Short Stay. Do not shave (including legs and underarms) for at least 48 hours prior to the first CHG shower.  You may shave your face/neck. Please follow these instructions carefully:  1.  Shower with CHG Soap the night before surgery and the  morning of Surgery.  2.  If you choose to wash your hair, wash your hair first as usual with your  normal  shampoo.  3.  After you shampoo, rinse your hair and body thoroughly to remove the  shampoo.                           4.  Use CHG as you would any other liquid soap.  You can apply chg directly  to the skin and wash  Gently with a scrungie or clean washcloth.  5.  Apply the CHG Soap to your body ONLY FROM THE NECK DOWN.   Do not use on face/ open                           Wound or open sores. Avoid contact with eyes, ears mouth and genitals (private parts).                       Wash face,  Genitals (private parts) with your normal soap.             6.  Wash thoroughly, paying special attention to the area where your surgery  will be performed.  7.  Thoroughly rinse your body with warm water from the neck down.  8.  DO NOT shower/wash with your normal soap after using and rinsing off  the CHG Soap.                9.  Pat yourself dry with a clean towel.            10.  Wear clean pajamas.            11.  Place clean sheets on your bed the night of your first shower and do not  sleep with pets. Day of Surgery : Do not apply any lotions/deodorants the morning of surgery.  Please wear clean clothes to the hospital/surgery center.  FAILURE TO FOLLOW THESE INSTRUCTIONS MAY RESULT IN THE CANCELLATION OF YOUR SURGERY PATIENT SIGNATURE_________________________________  NURSE SIGNATURE__________________________________  ________________________________________________________________________

## 2015-12-26 NOTE — Progress Notes (Signed)
Pt notified of surgical time change. Pt aware to arrive at [facility] at 9:00 am day of surgery 12/27/2015. Verbalized understanding of no food or drink after midnight.

## 2015-12-27 ENCOUNTER — Inpatient Hospital Stay (HOSPITAL_COMMUNITY): Payer: Managed Care, Other (non HMO)

## 2015-12-27 ENCOUNTER — Inpatient Hospital Stay (HOSPITAL_COMMUNITY): Payer: Managed Care, Other (non HMO) | Admitting: Anesthesiology

## 2015-12-27 ENCOUNTER — Encounter (HOSPITAL_COMMUNITY)
Admission: RE | Disposition: A | Discharge status: Home Health | Payer: Self-pay | Source: Ambulatory Visit | Attending: Orthopaedic Surgery

## 2015-12-27 ENCOUNTER — Encounter (HOSPITAL_COMMUNITY): Payer: Self-pay | Admitting: *Deleted

## 2015-12-27 ENCOUNTER — Inpatient Hospital Stay (HOSPITAL_COMMUNITY)
Admission: RE | Admit: 2015-12-27 | Discharge: 2015-12-29 | DRG: 470 | Disposition: A | Discharge status: Home Health | Payer: Managed Care, Other (non HMO) | Source: Ambulatory Visit | Attending: Orthopaedic Surgery | Admitting: Orthopaedic Surgery

## 2015-12-27 DIAGNOSIS — F17210 Nicotine dependence, cigarettes, uncomplicated: Secondary | ICD-10-CM | POA: Diagnosis present

## 2015-12-27 DIAGNOSIS — M25551 Pain in right hip: Secondary | ICD-10-CM | POA: Diagnosis present

## 2015-12-27 DIAGNOSIS — Z96641 Presence of right artificial hip joint: Secondary | ICD-10-CM

## 2015-12-27 DIAGNOSIS — Z90710 Acquired absence of both cervix and uterus: Secondary | ICD-10-CM

## 2015-12-27 DIAGNOSIS — M1611 Unilateral primary osteoarthritis, right hip: Principal | ICD-10-CM

## 2015-12-27 DIAGNOSIS — Z419 Encounter for procedure for purposes other than remedying health state, unspecified: Secondary | ICD-10-CM

## 2015-12-27 DIAGNOSIS — J449 Chronic obstructive pulmonary disease, unspecified: Secondary | ICD-10-CM | POA: Diagnosis present

## 2015-12-27 DIAGNOSIS — Z96649 Presence of unspecified artificial hip joint: Secondary | ICD-10-CM

## 2015-12-27 DIAGNOSIS — Z01812 Encounter for preprocedural laboratory examination: Secondary | ICD-10-CM | POA: Diagnosis not present

## 2015-12-27 HISTORY — PX: TOTAL HIP ARTHROPLASTY: SHX124

## 2015-12-27 LAB — TYPE AND SCREEN
ABO/RH(D): AB POS
ANTIBODY SCREEN: NEGATIVE

## 2015-12-27 SURGERY — ARTHROPLASTY, HIP, TOTAL, ANTERIOR APPROACH
Anesthesia: Spinal | Site: Hip | Laterality: Right

## 2015-12-27 MED ORDER — LACTATED RINGERS IV SOLN
INTRAVENOUS | Status: DC | PRN
  Administered 2015-12-27 (×2): via INTRAVENOUS

## 2015-12-27 MED ORDER — ACETAMINOPHEN 650 MG RE SUPP: 650.0000 mg | Freq: Four times a day (QID) | RECTAL | Status: DC | PRN

## 2015-12-27 MED ORDER — PROPOFOL 500 MG/50ML IV EMUL
INTRAVENOUS | Status: DC | PRN
  Administered 2015-12-27: 100 ug/kg/min via INTRAVENOUS

## 2015-12-27 MED ORDER — 0.9 % SODIUM CHLORIDE (POUR BTL) OPTIME
TOPICAL | Status: DC | PRN
  Administered 2015-12-27: 1000 mL

## 2015-12-27 MED ORDER — ATROPINE SULFATE 1 MG/ML IJ SOLN
INTRAMUSCULAR | Status: AC
  Filled 2015-12-27: qty 1

## 2015-12-27 MED ORDER — SODIUM CHLORIDE 0.9 % IR SOLN
Status: DC | PRN
  Administered 2015-12-27: 1000 mL

## 2015-12-27 MED ORDER — BUPIVACAINE HCL (PF) 0.5 % IJ SOLN
INTRAMUSCULAR | Status: DC | PRN
  Administered 2015-12-27: 2.75 mL

## 2015-12-27 MED ORDER — BUPIVACAINE HCL (PF) 0.5 % IJ SOLN
INTRAMUSCULAR | Status: AC
  Filled 2015-12-27: qty 30

## 2015-12-27 MED ORDER — ALUM & MAG HYDROXIDE-SIMETH 200-200-20 MG/5ML PO SUSP: 30.0000 mL | ORAL | Status: DC | PRN

## 2015-12-27 MED ORDER — CHLORHEXIDINE GLUCONATE 4 % EX LIQD: 60.0000 mL | Freq: Once | CUTANEOUS | Status: DC

## 2015-12-27 MED ORDER — PHENOL 1.4 % MT LIQD
1.0000 | OROMUCOSAL | Status: DC | PRN
  Filled 2015-12-27: qty 177

## 2015-12-27 MED ORDER — CEFAZOLIN SODIUM-DEXTROSE 2-3 GM-% IV SOLR
INTRAVENOUS | Status: AC
  Filled 2015-12-27: qty 50

## 2015-12-27 MED ORDER — TRAMADOL HCL 50 MG PO TABS: 100.0000 mg | ORAL_TABLET | Freq: Four times a day (QID) | ORAL | Status: DC | PRN

## 2015-12-27 MED ORDER — PROPOFOL 10 MG/ML IV BOLUS
INTRAVENOUS | Status: AC
  Filled 2015-12-27: qty 20

## 2015-12-27 MED ORDER — FENTANYL CITRATE (PF) 100 MCG/2ML IJ SOLN
INTRAMUSCULAR | Status: DC | PRN
  Administered 2015-12-27: 100 ug via INTRAVENOUS

## 2015-12-27 MED ORDER — OXYCODONE HCL 5 MG PO TABS
5.0000 mg | ORAL_TABLET | ORAL | Status: DC | PRN
  Administered 2015-12-27: 10 mg via ORAL
  Filled 2015-12-27 (×2): qty 2

## 2015-12-27 MED ORDER — FENTANYL CITRATE (PF) 100 MCG/2ML IJ SOLN
INTRAMUSCULAR | Status: AC
  Filled 2015-12-27: qty 2

## 2015-12-27 MED ORDER — GLYCOPYRROLATE 0.2 MG/ML IJ SOLN
0.2000 mg | Freq: Once | INTRAMUSCULAR | Status: AC
  Administered 2015-12-27: 0.2 mg via INTRAVENOUS

## 2015-12-27 MED ORDER — PROPOFOL 10 MG/ML IV BOLUS
INTRAVENOUS | Status: DC | PRN
  Administered 2015-12-27: 50 mg via INTRAVENOUS

## 2015-12-27 MED ORDER — CEFAZOLIN SODIUM 1-5 GM-% IV SOLN
1.0000 g | Freq: Four times a day (QID) | INTRAVENOUS | Status: AC
  Administered 2015-12-27 – 2015-12-28 (×2): 1 g via INTRAVENOUS
  Filled 2015-12-27 (×2): qty 50

## 2015-12-27 MED ORDER — GLYCOPYRROLATE 0.2 MG/ML IJ SOLN
INTRAMUSCULAR | Status: AC
  Filled 2015-12-27: qty 1

## 2015-12-27 MED ORDER — DEXAMETHASONE SODIUM PHOSPHATE 10 MG/ML IJ SOLN
INTRAMUSCULAR | Status: AC
  Filled 2015-12-27: qty 1

## 2015-12-27 MED ORDER — MENTHOL 3 MG MT LOZG
1.0000 | LOZENGE | OROMUCOSAL | Status: DC | PRN
  Filled 2015-12-27: qty 9

## 2015-12-27 MED ORDER — BUPROPION HCL ER (XL) 150 MG PO TB24
150.0000 mg | ORAL_TABLET | Freq: Every day | ORAL | Status: DC
  Administered 2015-12-28 – 2015-12-29 (×2): 150 mg via ORAL
  Filled 2015-12-27 (×2): qty 1

## 2015-12-27 MED ORDER — ONDANSETRON HCL 4 MG/2ML IJ SOLN
4.0000 mg | Freq: Four times a day (QID) | INTRAMUSCULAR | Status: DC | PRN
  Administered 2015-12-27: 4 mg via INTRAVENOUS
  Filled 2015-12-27: qty 2

## 2015-12-27 MED ORDER — KETOROLAC TROMETHAMINE 15 MG/ML IJ SOLN
7.5000 mg | Freq: Four times a day (QID) | INTRAMUSCULAR | Status: AC
  Administered 2015-12-27 – 2015-12-28 (×4): 7.5 mg via INTRAVENOUS
  Filled 2015-12-27 (×5): qty 1

## 2015-12-27 MED ORDER — GLYCOPYRROLATE 0.2 MG/ML IJ SOLN: 0.1000 mg | Freq: Once | INTRAMUSCULAR | Status: DC

## 2015-12-27 MED ORDER — ACETAMINOPHEN 325 MG PO TABS: 650.0000 mg | ORAL_TABLET | Freq: Four times a day (QID) | ORAL | Status: DC | PRN

## 2015-12-27 MED ORDER — ONDANSETRON HCL 4 MG/2ML IJ SOLN
INTRAMUSCULAR | Status: DC | PRN
  Administered 2015-12-27: 4 mg via INTRAVENOUS

## 2015-12-27 MED ORDER — LORAZEPAM 0.5 MG PO TABS
0.5000 mg | ORAL_TABLET | Freq: Three times a day (TID) | ORAL | Status: DC | PRN
  Administered 2015-12-27 – 2015-12-28 (×2): 0.5 mg via ORAL
  Filled 2015-12-27 (×3): qty 1

## 2015-12-27 MED ORDER — DIPHENHYDRAMINE HCL 12.5 MG/5ML PO ELIX
12.5000 mg | ORAL_SOLUTION | ORAL | Status: DC | PRN
  Administered 2015-12-27: 25 mg via ORAL
  Filled 2015-12-27: qty 10

## 2015-12-27 MED ORDER — ZOLPIDEM TARTRATE 5 MG PO TABS
5.0000 mg | ORAL_TABLET | Freq: Every evening | ORAL | Status: DC | PRN
  Administered 2015-12-27: 5 mg via ORAL
  Filled 2015-12-27: qty 1

## 2015-12-27 MED ORDER — LORATADINE 10 MG PO TABS: 10.0000 mg | ORAL_TABLET | Freq: Every day | ORAL | Status: DC | PRN

## 2015-12-27 MED ORDER — ASPIRIN EC 325 MG PO TBEC
325.0000 mg | DELAYED_RELEASE_TABLET | Freq: Two times a day (BID) | ORAL | Status: DC
  Administered 2015-12-28 – 2015-12-29 (×4): 325 mg via ORAL
  Filled 2015-12-27 (×4): qty 1

## 2015-12-27 MED ORDER — PHENYLEPHRINE 40 MCG/ML (10ML) SYRINGE FOR IV PUSH (FOR BLOOD PRESSURE SUPPORT)
PREFILLED_SYRINGE | INTRAVENOUS | Status: AC
  Filled 2015-12-27: qty 10

## 2015-12-27 MED ORDER — METOCLOPRAMIDE HCL 5 MG/ML IJ SOLN
5.0000 mg | Freq: Three times a day (TID) | INTRAMUSCULAR | Status: DC | PRN
  Administered 2015-12-27: 10 mg via INTRAVENOUS
  Administered 2015-12-28: 5 mg via INTRAVENOUS
  Administered 2015-12-28: 10 mg via INTRAVENOUS
  Filled 2015-12-27 (×3): qty 2

## 2015-12-27 MED ORDER — LIDOCAINE HCL (CARDIAC) 20 MG/ML IV SOLN
INTRAVENOUS | Status: AC
  Filled 2015-12-27: qty 5

## 2015-12-27 MED ORDER — METOCLOPRAMIDE HCL 10 MG PO TABS
5.0000 mg | ORAL_TABLET | Freq: Three times a day (TID) | ORAL | Status: DC | PRN
  Administered 2015-12-29: 10 mg via ORAL
  Filled 2015-12-27 (×3): qty 1

## 2015-12-27 MED ORDER — ONDANSETRON HCL 4 MG PO TABS: 4.0000 mg | ORAL_TABLET | Freq: Four times a day (QID) | ORAL | Status: DC | PRN

## 2015-12-27 MED ORDER — METHOCARBAMOL 500 MG PO TABS
500.0000 mg | ORAL_TABLET | Freq: Four times a day (QID) | ORAL | Status: DC | PRN
  Filled 2015-12-27: qty 1

## 2015-12-27 MED ORDER — DEXAMETHASONE SODIUM PHOSPHATE 10 MG/ML IJ SOLN
INTRAMUSCULAR | Status: DC | PRN
  Administered 2015-12-27: 10 mg via INTRAVENOUS

## 2015-12-27 MED ORDER — SODIUM CHLORIDE 0.9 % IV SOLN
INTRAVENOUS | Status: DC
  Administered 2015-12-27 – 2015-12-28 (×2): via INTRAVENOUS

## 2015-12-27 MED ORDER — HYDROMORPHONE HCL 1 MG/ML IJ SOLN
1.0000 mg | INTRAMUSCULAR | Status: DC | PRN
  Administered 2015-12-27 – 2015-12-28 (×4): 1 mg via INTRAVENOUS
  Filled 2015-12-27 (×4): qty 1

## 2015-12-27 MED ORDER — CEFAZOLIN SODIUM-DEXTROSE 2-3 GM-% IV SOLR
2.0000 g | INTRAVENOUS | Status: AC
  Administered 2015-12-27: 2 g via INTRAVENOUS

## 2015-12-27 MED ORDER — MIDAZOLAM HCL 5 MG/5ML IJ SOLN
INTRAMUSCULAR | Status: DC | PRN
  Administered 2015-12-27: 1 mg via INTRAVENOUS
  Administered 2015-12-27: 2 mg via INTRAVENOUS
  Administered 2015-12-27: 1 mg via INTRAVENOUS

## 2015-12-27 MED ORDER — LACTATED RINGERS IV SOLN
INTRAVENOUS | Status: DC
  Administered 2015-12-27: 12:00:00 via INTRAVENOUS

## 2015-12-27 MED ORDER — FENTANYL CITRATE (PF) 100 MCG/2ML IJ SOLN
25.0000 ug | INTRAMUSCULAR | Status: DC | PRN
  Administered 2015-12-27 (×2): 25 ug via INTRAVENOUS

## 2015-12-27 MED ORDER — ONDANSETRON HCL 4 MG/2ML IJ SOLN: 4.0000 mg | Freq: Once | INTRAMUSCULAR | Status: DC | PRN

## 2015-12-27 MED ORDER — TRANEXAMIC ACID 1000 MG/10ML IV SOLN
1000.0000 mg | INTRAVENOUS | Status: AC
  Administered 2015-12-27: 1000 mg via INTRAVENOUS
  Filled 2015-12-27: qty 10

## 2015-12-27 MED ORDER — MIDAZOLAM HCL 2 MG/2ML IJ SOLN
INTRAMUSCULAR | Status: AC
  Filled 2015-12-27: qty 2

## 2015-12-27 MED ORDER — METHOCARBAMOL 1000 MG/10ML IJ SOLN
500.0000 mg | Freq: Four times a day (QID) | INTRAVENOUS | Status: DC | PRN
  Administered 2015-12-27: 500 mg via INTRAVENOUS
  Filled 2015-12-27 (×2): qty 5

## 2015-12-27 MED ORDER — ONDANSETRON HCL 4 MG/2ML IJ SOLN
INTRAMUSCULAR | Status: AC
  Filled 2015-12-27: qty 2

## 2015-12-27 MED ORDER — LIDOCAINE HCL (CARDIAC) 20 MG/ML IV SOLN
INTRAVENOUS | Status: DC | PRN
  Administered 2015-12-27 (×2): 50 mg via INTRAVENOUS

## 2015-12-27 MED ORDER — DOCUSATE SODIUM 100 MG PO CAPS
100.0000 mg | ORAL_CAPSULE | Freq: Two times a day (BID) | ORAL | Status: DC
  Administered 2015-12-27 – 2015-12-29 (×4): 100 mg via ORAL
  Filled 2015-12-27 (×4): qty 1

## 2015-12-27 SURGICAL SUPPLY — 36 items
APL SKNCLS STERI-STRIP NONHPOA (GAUZE/BANDAGES/DRESSINGS)
BAG SPEC THK2 15X12 ZIP CLS (MISCELLANEOUS)
BAG ZIPLOCK 12X15 (MISCELLANEOUS) IMPLANT
BENZOIN TINCTURE PRP APPL 2/3 (GAUZE/BANDAGES/DRESSINGS) IMPLANT
BLADE SAW SGTL 18X1.27X75 (BLADE) ×2 IMPLANT
CAPT HIP TOTAL 2 ×1 IMPLANT
CELLS DAT CNTRL 66122 CELL SVR (MISCELLANEOUS) ×1 IMPLANT
CLOTH BEACON ORANGE TIMEOUT ST (SAFETY) ×2 IMPLANT
DRAPE STERI IOBAN 125X83 (DRAPES) ×2 IMPLANT
DRAPE U-SHAPE 47X51 STRL (DRAPES) ×4 IMPLANT
DRSG AQUACEL AG ADV 3.5X10 (GAUZE/BANDAGES/DRESSINGS) ×2 IMPLANT
DURAPREP 26ML APPLICATOR (WOUND CARE) ×2 IMPLANT
ELECT REM PT RETURN 9FT ADLT (ELECTROSURGICAL) ×2
ELECTRODE REM PT RTRN 9FT ADLT (ELECTROSURGICAL) ×1 IMPLANT
GAUZE XEROFORM 1X8 LF (GAUZE/BANDAGES/DRESSINGS) IMPLANT
GLOVE BIO SURGEON STRL SZ7.5 (GLOVE) ×2 IMPLANT
GLOVE BIOGEL PI IND STRL 8 (GLOVE) ×2 IMPLANT
GLOVE BIOGEL PI INDICATOR 8 (GLOVE) ×2
GLOVE ECLIPSE 8.0 STRL XLNG CF (GLOVE) ×2 IMPLANT
GOWN STRL REUS W/TWL XL LVL3 (GOWN DISPOSABLE) ×4 IMPLANT
HANDPIECE INTERPULSE COAX TIP (DISPOSABLE) ×2
HOLDER FOLEY CATH W/STRAP (MISCELLANEOUS) ×2 IMPLANT
PACK ANTERIOR HIP CUSTOM (KITS) ×2 IMPLANT
RETRACTOR WND ALEXIS 18 MED (MISCELLANEOUS) ×1 IMPLANT
RTRCTR WOUND ALEXIS 18CM MED (MISCELLANEOUS) ×2
SET HNDPC FAN SPRY TIP SCT (DISPOSABLE) ×1 IMPLANT
STAPLER VISISTAT 35W (STAPLE) IMPLANT
STRIP CLOSURE SKIN 1/2X4 (GAUZE/BANDAGES/DRESSINGS) ×1 IMPLANT
SUT ETHIBOND NAB CT1 #1 30IN (SUTURE) ×2 IMPLANT
SUT MNCRL AB 4-0 PS2 18 (SUTURE) ×1 IMPLANT
SUT VIC AB 0 CT1 36 (SUTURE) ×2 IMPLANT
SUT VIC AB 1 CT1 36 (SUTURE) ×2 IMPLANT
SUT VIC AB 2-0 CT1 27 (SUTURE) ×4
SUT VIC AB 2-0 CT1 TAPERPNT 27 (SUTURE) ×2 IMPLANT
TRAY FOLEY W/METER SILVER 14FR (SET/KITS/TRAYS/PACK) ×2 IMPLANT
TRAY FOLEY W/METER SILVER 16FR (SET/KITS/TRAYS/PACK) ×1 IMPLANT

## 2015-12-27 NOTE — Progress Notes (Signed)
Portable AP Pelvis and Lateral Right Hip X-rays done. 

## 2015-12-27 NOTE — Anesthesia Preprocedure Evaluation (Signed)
Anesthesia Evaluation  Patient identified by MRN, date of birth, ID band Patient awake    Reviewed: Allergy & Precautions, NPO status , Patient's Chart, lab work & pertinent test results  History of Anesthesia Complications Negative for: history of anesthetic complications  Airway Mallampati: II  TM Distance: >3 FB Neck ROM: Full    Dental no notable dental hx. (+) Dental Advisory Given   Pulmonary COPD, Current Smoker,    Pulmonary exam normal breath sounds clear to auscultation       Cardiovascular negative cardio ROS Normal cardiovascular exam Rhythm:Regular Rate:Normal     Neuro/Psych PSYCHIATRIC DISORDERS Anxiety Depression negative neurological ROS     GI/Hepatic negative GI ROS, Neg liver ROS,   Endo/Other  negative endocrine ROS  Renal/GU negative Renal ROS  negative genitourinary   Musculoskeletal  (+) Arthritis , Osteoarthritis,    Abdominal   Peds negative pediatric ROS (+)  Hematology negative hematology ROS (+)   Anesthesia Other Findings   Reproductive/Obstetrics negative OB ROS                             Anesthesia Physical Anesthesia Plan  ASA: II  Anesthesia Plan: Spinal   Post-op Pain Management:    Induction:   Airway Management Planned:   Additional Equipment:   Intra-op Plan:   Post-operative Plan:   Informed Consent: I have reviewed the patients History and Physical, chart, labs and discussed the procedure including the risks, benefits and alternatives for the proposed anesthesia with the patient or authorized representative who has indicated his/her understanding and acceptance.   Dental advisory given  Plan Discussed with: CRNA  Anesthesia Plan Comments:         Anesthesia Quick Evaluation

## 2015-12-27 NOTE — H&P (Signed)
TOTAL HIP ADMISSION H&P  Patient is admitted for right total hip arthroplasty.  Subjective:  Chief Complaint: right hip pain  HPI: [name] [name], 55 y.o. female, has a history of pain and functional disability in the right hip(s) due to arthritis and patient has failed non-surgical conservative treatments for greater than 12 weeks to include NSAID's and/or analgesics, corticosteriod injections, flexibility and strengthening excercises, use of assistive devices, weight reduction as appropriate and activity modification.  Onset of symptoms was gradual starting 10 years ago with gradually worsening course since that time.The patient noted no past surgery on the right hip(s).  Patient currently rates pain in the right hip at 10 out of 10 with activity. Patient has night pain, worsening of pain with activity and weight bearing, trendelenberg gait, pain that interfers with activities of daily living, pain with passive range of motion and crepitus. Patient has evidence of subchondral cysts, subchondral sclerosis, periarticular osteophytes and joint space narrowing by imaging studies. This condition presents safety issues increasing the risk of falls.  There is no current active infection.  Patient Active Problem List   Diagnosis Date Noted  . Osteoarthritis of right hip 12/27/2015  . Pre-operative cardiovascular examination 09/16/2015  . Cough 08/27/2014  . Left shoulder pain 08/27/2014  . Depression 08/27/2014  . Right hip pain 06/29/2014  . Nausea with vomiting 06/29/2014  . Vitiligo 04/12/2012  . Surgical menopause 06/16/2011  . MAMMOGRAM, ABNORMAL, LEFT 02/17/2010  . HYPERLIPIDEMIA 02/06/2010  . TOBACCO ABUSE 02/06/2010  . Anxiety and depression 02/06/2010  . OSTEOARTHRITIS 02/06/2010   Past Medical History  Diagnosis Date  . Depression   . HLD (hyperlipidemia)   . Tobacco abuse   . OA (osteoarthritis)   . Anxiety disorder   . COPD (chronic obstructive pulmonary disease) ([place])   .  Fibroids     Uterine  . Vitiligo 1975  . Wears glasses   . Anxiety   . Broken jaw [facility])     Past Surgical History  Procedure Laterality Date  . Abdominal hysterectomy  2011  . Ureter surgery  2011  . Mandible surgery  1980  . Hip arthroscopy  2003    right   . Dilation and curettage of uterus    . Tubal ligation      No prescriptions prior to admission   Allergies  Allergen Reactions  . Codeine Nausea Only    Pt states when given codeine Zofran will not work for the nausea     Social History  Substance Use Topics  . Smoking status: Current Every Day Smoker -- 0.50 packs/day for 42 years    Types: Cigarettes  . Smokeless tobacco: Never Used  . Alcohol Use: No     Comment: history of alcoholism quit 16 years ago     Family History  Problem Relation Age of Onset  . Depression Mother   . Cancer Father   . Depression Father   . Miscarriages / Stillbirths Sister      Review of Systems  Musculoskeletal: Positive for joint pain.  All other systems reviewed and are negative.   Objective:  Physical Exam  Constitutional: She is oriented to person, place, and time. She appears well-developed and well-nourished.  HENT:  Head: Normocephalic and atraumatic.  Eyes: EOM are normal. Pupils are equal, round, and reactive to light.  Neck: Normal range of motion. Neck supple.  Cardiovascular: Normal rate and regular rhythm.   Respiratory: Effort normal and breath sounds normal.  GI: Soft. Bowel  sounds are normal.  Musculoskeletal:       Right hip: She exhibits decreased range of motion, decreased strength, tenderness and bony tenderness.  Neurological: She is alert and oriented to person, place, and time.  Skin: Skin is warm and dry.  Psychiatric: She has a normal mood and affect.    Vital signs in last 24 hours:    Labs:   Estimated body mass index is 22.81 kg/(m^2) as calculated from the following:   Height as of 08/19/15: 4\' 11"  (1.499 m).   Weight as of 09/16/15:  51.256 kg (113 lb).   Imaging Review Plain radiographs demonstrate severe degenerative joint disease of the right hip(s). The bone quality appears to be good for age and reported activity level.  Assessment/Plan:  End stage arthritis, right hip(s)  The patient history, physical examination, clinical judgement of the provider and imaging studies are consistent with end stage degenerative joint disease of the right hip(s) and total hip arthroplasty is deemed medically necessary. The treatment options including medical management, injection therapy, arthroscopy and arthroplasty were discussed at length. The risks and benefits of total hip arthroplasty were presented and reviewed. The risks due to aseptic loosening, infection, stiffness, dislocation/subluxation,  thromboembolic complications and other imponderables were discussed.  The patient acknowledged the explanation, agreed to proceed with the plan and consent was signed. Patient is being admitted for inpatient treatment for surgery, pain control, PT, OT, prophylactic antibiotics, VTE prophylaxis, progressive ambulation and ADL's and discharge planning.The patient is planning to be discharged home with home health services

## 2015-12-27 NOTE — Transfer of Care (Signed)
Immediate Anesthesia Transfer of Care Note  Patient: [name] [name]  Procedure(s) Performed: Procedure(s): RIGHT TOTAL HIP ARTHROPLASTY ANTERIOR APPROACH (Right)  Patient Location: PACU  Anesthesia Type:General, Spinal and Spinal administered with questionable results - added General.  Level of Consciousness: awake and sedated  Airway & Oxygen Therapy: Patient Spontanous Breathing and Patient connected to face mask oxygen  Post-op Assessment: Report given to RN and Post -op Vital signs reviewed and stable  Post vital signs: Reviewed and stable  Last Vitals:  Filed Vitals:   12/27/15 0826  BP: 130/75  Pulse: 62  Temp: 36.2 C  Resp: 16    Complications: No apparent anesthesia complications

## 2015-12-27 NOTE — Brief Op Note (Signed)
12/27/2015  11:42 AM  PATIENT:  [name] [name]  55 y.o. female  PRE-OPERATIVE DIAGNOSIS:  severe osteoarthritis right hip  POST-OPERATIVE DIAGNOSIS:  severe osteoarthritis right hip  PROCEDURE:  Procedure(s): RIGHT TOTAL HIP ARTHROPLASTY ANTERIOR APPROACH (Right)  SURGEON:  Surgeon(s) and Role:    * Mcarthur Rossetti, MD - Primary  PHYSICIAN ASSISTANT: Benita Stabile, PA-C  ANESTHESIA:   spinal  EBL:  Total I/O In: 1000 [I.V.:1000] Out: 300 [Blood:300]  COUNTS:  YES  TOURNIQUET:  * No tourniquets in log *  DICTATION: .Other Dictation: Dictation Number [id number]  PLAN OF CARE: Admit to inpatient   PATIENT DISPOSITION:  PACU - hemodynamically stable.   Delay start of Pharmacological VTE agent (>24hrs) due to surgical blood loss or risk of bleeding: no

## 2015-12-27 NOTE — Progress Notes (Signed)
X-ray results noted 

## 2015-12-27 NOTE — Anesthesia Procedure Notes (Addendum)
Spinal Patient location during procedure: OR End time: 12/27/2015 10:26 AM Staffing Resident/CRNA: Noralyn Pick D Performed by: anesthesiologist and resident/CRNA  Preanesthetic Checklist Completed: patient identified, site marked, surgical consent, pre-op evaluation, timeout performed, IV checked, risks and benefits discussed and monitors and equipment checked Spinal Block Patient position: sitting Prep: Betadine Patient monitoring: heart rate, continuous pulse ox and blood pressure Location: L3-4 Injection technique: single-shot Needle Needle type: Sprotte  Needle gauge: 24 G Needle length: 9 cm Assessment Sensory level: T6 Additional Notes Expiration date of kit checked and confirmed. Patient tolerated procedure well, without complications.    Procedure Name: LMA Insertion Date/Time: 12/27/2015 10:58 AM Performed by: Noralyn Pick D Pre-anesthesia Checklist: Patient identified, Emergency Drugs available, Suction available and Patient being monitored Patient Re-evaluated:Patient Re-evaluated prior to inductionOxygen Delivery Method: Circle System Utilized Preoxygenation: Pre-oxygenation with 100% oxygen Intubation Type: IV induction Ventilation: Mask ventilation without difficulty LMA Size: 3.0 Tube type: Oral Number of attempts: 1 Airway Equipment and Method: Oral airway Placement Confirmation: positive ETCO2 and breath sounds checked- equal and bilateral Tube secured with: Tape Dental Injury: Teeth and Oropharynx as per pre-operative assessment

## 2015-12-27 NOTE — Anesthesia Postprocedure Evaluation (Signed)
Anesthesia Post Note  Patient: [name] [name]  Procedure(s) Performed: Procedure(s) (LRB): RIGHT TOTAL HIP ARTHROPLASTY ANTERIOR APPROACH (Right)  Patient location during evaluation: PACU Anesthesia Type: General and Spinal Level of consciousness: awake and alert Pain management: pain level controlled Vital Signs Assessment: post-procedure vital signs reviewed and stable Respiratory status: spontaneous breathing, nonlabored ventilation, respiratory function stable and patient connected to nasal cannula oxygen Cardiovascular status: blood pressure returned to baseline and stable Postop Assessment: no signs of nausea or vomiting and spinal receding Anesthetic complications: no    Last Vitals:  Filed Vitals:   12/27/15 1315 12/27/15 1330  BP: 143/99 146/104  Pulse: 75 73  Temp:    Resp: 13 15    Last Pain:  Filed Vitals:   12/27/15 1343  PainSc: 0-No pain                 [name] [name]

## 2015-12-27 NOTE — Op Note (Signed)
NAME:  [name] [name], [name] [name]              ACCOUNT NO.:  [id number]  MEDICAL RECORD NO.:  [id number]  LOCATION:  [facility]                         FACILITY:  [name]  PHYSICIAN:  Lind Guest. Ninfa Linden, M.D.DATE OF BIRTH:  [birth year]  DATE OF PROCEDURE:  12/27/2015 DATE OF DISCHARGE:                              OPERATIVE REPORT   PREOPERATIVE DIAGNOSIS:  Primary osteoarthritis and degenerative joint disease, right hip.  POSTOPERATIVE DIAGNOSIS:  Primary osteoarthritis and degenerative joint disease, right hip.  PROCEDURE:  Right total hip arthroplasty direct anterior approach.  IMPLANTS:  DePuy Sector Gription acetabular component, size 50, size 32+ 0 polyethylene liner, size 10 Corail femoral component with varus offset, size 32+ 1 ceramic hip ball.  SURGEON:  Lind Guest. Ninfa Linden, M.D.  ASSISTANT:  Erskine Emery, PA-C  ANESTHESIA:  Spinal.  BLOOD LOSS:  Less than 200 mL.  COMPLICATIONS:  None.  ANTIBIOTICS:  2 g of IV Ancef.  INDICATIONS:  [name] [name] is a 55 year old female, who has had over a 10- year history of worsening right hip pain.  She has x-ray evidence from 2006, all the way to a recent worsening arthritis of her right hip.  It involves both the femoral head and the acetabulum.  There were significant cystic changes, periarticular osteophytes and joint space narrowing as well as sclerotic changes.  She has tried and failed all forms of conservative treatment.  At this point, it is detrimentally affected her activities of daily living, her quality of life and her mobility.  She at that point does wish to proceed with a total hip arthroplasty and I agree with her failing all forms of conservative treatment, this is definitely warranted after this long period of time. She understands the risk of acute blood loss anemia, nerve and vessel injury, fracture, infection, dislocation, DVT.  She understands our goals are decreased pain, improved mobility, and overall  improved quality of life.  PROCEDURE DESCRIPTION:  After informed consent was obtained, appropriate right hip was marked.  She was brought to the operating room and spinal anesthesia was obtained while she was on her stretcher.  Foley catheter was then placed and she was laid in the supine position on the stretcher.  Traction boots were placed on both of her feet.  Next, she was placed supine on the Hana fracture table with the perineal post in place and both legs in inline skeletal traction devices, but no traction applied.  Her right operative hip was then prepped and draped with DuraPrep and sterile drapes.  A time-out was called and she was identified as correct patient and correct right hip.  We then made an incision inferior and posterior to the anterior superior iliac spine and carried this obliquely down the leg.  We dissected down the tensor fascia lata muscle and the tensor fascia was then divided longitudinally, so we could proceed with a direct anterior approach to the hip.  We identified and cauterized the lateral femoral circumflex vessels and then identified the hip capsule, opened up the hip capsule in an L-type format finding a large joint effusion.  We could see articular osteophytes right away and there was actually significant loose bodies within the hip joint itself.  Using oscillating saw, we made our femoral neck cut proximal to the lesser trochanter and completed this with an osteotome.  We placed a corkscrew guide in the femoral head in its entirety and found it to be completely devoid of cartilage.  We also found cystic changes in the acetabular side.  We removed remnants of the acetabular labrum.  I placed a bent Hohmann over the medial acetabular rim and then removed another loose body.  We then began reaming under direct visualization from a size 42 reamer up to a size 50 with all reamers under direct visualization and the last reamer under direct  fluoroscopy, so we could obtain our depth of reaming, our inclination and anteversion.  Once we were pleased with this, we placed the real DePuy Sector Gription acetabular component size 50 and a 32+ 0 polyethylene liner for that size acetabular component.  Attention was then turned to the femur.  With the leg externally rotated to 100 degrees, extended and adducted, we were able to place a Mueller retractor medially and a Hohmann retractor behind the greater trochanter.  We released the lateral joint capsule and used a box cutting osteotome to enter the femoral canal and a rongeur to lateralize.  We then began broaching from a size 8 broach up to a size 10.  With the size 10 broach in place, we trialed a varus offset femoral neck and a 32+ 1 hip ball.  We brought the leg back over and up with traction and internal rotation.  We placed it back into the pelvis.  We were pleased with leg length, offset and stability with range of motion. We then dislocated the hip and removed the trial components.  We were able to place the real Corail femoral component size 10 with varus offset and the real 32+ 1 ceramic hip ball, reduced this back in the acetabulum and again, it was stable.  We then irrigated the soft tissue with normal saline solution using pulsatile lavage.  We closed the joint capsule with interrupted #1 Ethibond suture followed by running #1 Vicryl in the tensor fascia, 0 Vicryl in the deep tissue, 2-0 Vicryl in the subcutaneous tissue, 4-0 Monocryl subcuticular stitch and Steri- Strips on the skin.  An Aquacel dressing was applied.  She was then taken off the Hana table and taken to the recovery room in stable condition.  All final counts were correct.  There were no complications noted.  Of note, Erskine Emery, PA-C assisted the entire case.  His assistance was crucial for facilitating all aspects of this case.     Lind Guest. Ninfa Linden, M.D.     CYB/MEDQ  D:  12/27/2015   T:  12/27/2015  Job:  [id number]

## 2015-12-28 LAB — BASIC METABOLIC PANEL
Anion gap: 6 (ref 5–15)
BUN: 11 mg/dL (ref 6–20)
CALCIUM: 8.6 mg/dL — AB (ref 8.9–10.3)
CO2: 27 mmol/L (ref 22–32)
CREATININE: 0.78 mg/dL (ref 0.44–1.00)
Chloride: 104 mmol/L (ref 101–111)
Glucose, Bld: 130 mg/dL — ABNORMAL HIGH (ref 65–99)
Potassium: 4.1 mmol/L (ref 3.5–5.1)
SODIUM: 137 mmol/L (ref 135–145)

## 2015-12-28 LAB — CBC
HCT: 32.7 % — ABNORMAL LOW (ref 36.0–46.0)
Hemoglobin: 11.1 g/dL — ABNORMAL LOW (ref 12.0–15.0)
MCH: 29.8 pg (ref 26.0–34.0)
MCHC: 33.9 g/dL (ref 30.0–36.0)
MCV: 87.7 fL (ref 78.0–100.0)
PLATELETS: 141 10*3/uL — AB (ref 150–400)
RBC: 3.73 MIL/uL — ABNORMAL LOW (ref 3.87–5.11)
RDW: 13.1 % (ref 11.5–15.5)
WBC: 10.6 10*3/uL — ABNORMAL HIGH (ref 4.0–10.5)

## 2015-12-28 MED ORDER — HYDROMORPHONE HCL 2 MG PO TABS
1.0000 mg | ORAL_TABLET | ORAL | Status: DC | PRN
  Administered 2015-12-28 – 2015-12-29 (×4): 1 mg via ORAL
  Filled 2015-12-28 (×4): qty 1

## 2015-12-28 MED ORDER — PROMETHAZINE HCL 25 MG/ML IJ SOLN
12.5000 mg | Freq: Four times a day (QID) | INTRAMUSCULAR | Status: DC | PRN
  Administered 2015-12-28 (×2): 25 mg via INTRAVENOUS
  Administered 2015-12-28: 12.5 mg via INTRAVENOUS
  Filled 2015-12-28 (×4): qty 1

## 2015-12-28 NOTE — Progress Notes (Signed)
Pt had surgery on yesterday and she is experiencing nausea and vomiting with no relief.Reglan 5mg  was given with no relief.  Pt states that IV zofran does not work for her.  Paged md and received an order for Phenergan IV for her. Will give and monitor.

## 2015-12-28 NOTE — Evaluation (Signed)
Physical Therapy Evaluation Patient Details Name: [name] [name] MRN: [id number] DOB: [birth year] Today's Date: 12/28/2015   History of Present Illness  55 yo female s/p R THA-direct anterior 12/27/15.   Clinical Impression  On eval, pt required Min assist for mobility-walked ~75 feet with RW. Pt tolerated activity fairly well, even requesting to walk farther than suggested. Will plan to have a 2nd session. Recommend HHPT.     Follow Up Recommendations Home health PT    Equipment Recommendations  None recommended by PT    Recommendations for Other Services       Precautions / Restrictions Precautions Precautions: Fall Restrictions Weight Bearing Restrictions: No RLE Weight Bearing: Weight bearing as tolerated      Mobility  Bed Mobility Overal bed mobility: Needs Assistance Bed Mobility: Supine to Sit     Supine to sit: HOB elevated;Min assist     General bed mobility comments: Assist for R LE. Increased time.  Transfers Overall transfer level: Needs assistance Equipment used: Rolling walker (2 wheeled) Transfers: Sit to/from Stand Sit to Stand: Min assist;From elevated surface         General transfer comment: Assist to rise, stabilize, control descent. VCs safety, hand placement.   Ambulation/Gait Ambulation/Gait assistance: Min guard Ambulation Distance (Feet): 75 Feet Assistive device: Rolling walker (2 wheeled) Gait Pattern/deviations: Step-to pattern;Antalgic     General Gait Details: close guard for safety. VCs safety, sequence. slow gait speed. HR 117 bpm, O2 97% RA  Stairs            Wheelchair Mobility    Modified Rankin (Stroke Patients Only)       Balance                                             Pertinent Vitals/Pain Pain Assessment: 0-10 Pain Score: 3  Pain Location: R hip Pain Descriptors / Indicators: Sore Pain Intervention(s): Monitored during session;Ice applied;Repositioned    Home Living  Family/patient expects to be discharged to:: Private residence Living Arrangements: Spouse/significant other Available Help at Discharge: Family Type of Home: Mobile home   Entrance Stairs-Rails: Right;Left;Can reach both Entrance Stairs-Number of Steps: 2+1 Home Layout: One level Home Equipment: [place] - 2 wheels;Cane - single point;Bedside commode      Prior Function Level of Independence: Independent               Hand Dominance        Extremity/Trunk Assessment   Upper Extremity Assessment: Defer to OT evaluation           Lower Extremity Assessment: RLE deficits/detail RLE Deficits / Details: hip flex 2/5, moves ankle well    Cervical / Trunk Assessment: Normal  Communication   Communication: No difficulties  Cognition Arousal/Alertness: Awake/alert Behavior During Therapy: WFL for tasks assessed/performed Overall Cognitive Status: Within Functional Limits for tasks assessed                      General Comments      Exercises General Exercises - Lower Extremity Heel Slides: AAROM;Right;5 reps;Supine      Assessment/Plan    PT Assessment Patient needs continued PT services  PT Diagnosis Difficulty walking;Acute pain   PT Problem List Decreased strength;Decreased range of motion;Decreased activity tolerance;Decreased balance;Decreased mobility;Pain;Decreased knowledge of use of DME  PT Treatment Interventions DME instruction;Gait training;Stair training;Functional mobility training;Therapeutic activities;Patient/family  education;Balance training;Therapeutic exercise   PT Goals (Current goals can be found in the Care Plan section) Acute Rehab PT Goals Patient Stated Goal: less pain PT Goal Formulation: With patient/family Time For Goal Achievement: 01/04/16 Potential to Achieve Goals: Good    Frequency 7X/week   Barriers to discharge        Co-evaluation               End of Session Equipment Utilized During Treatment: Gait  belt Activity Tolerance: Patient limited by pain Patient left: in chair;with call bell/phone within reach;with chair alarm set;with family/visitor present           Time: [id number] PT Time Calculation (min) (ACUTE ONLY): 25 min   Charges:   PT Evaluation $PT Eval Low Complexity: 1 Procedure PT Treatments $Gait Training: 8-22 mins   PT G Codes:        Weston Anna, MPT Pager: [phone]

## 2015-12-28 NOTE — Progress Notes (Signed)
Agrees with previous shift assessment

## 2015-12-28 NOTE — Progress Notes (Signed)
Physical Therapy Treatment Patient Details Name: RITAMAE HYDER MRN: [id number] DOB: 03/21/1961 Today's Date: 12/28/2015    History of Present Illness 55 yo female s/p R THA-direct anterior 12/27/15.     PT Comments    POD # 1 pm session. Assisted out of recliner to amb a greater distance in hallway.  Feeling better.  No Nausea.  HR with amb 124.    Follow Up Recommendations  Home health PT     Equipment Recommendations  None recommended by PT    Recommendations for Other Services       Precautions / Restrictions Precautions Precautions: Fall Restrictions Weight Bearing Restrictions: No RLE Weight Bearing: Weight bearing as tolerated    Mobility  Bed Mobility Overal bed mobility: Needs Assistance Bed Mobility: Supine to Sit     Supine to sit: HOB elevated;Min assist     General bed mobility comments: Pt OOB in recliner  Transfers Overall transfer level: Needs assistance Equipment used: Rolling walker (2 wheeled) Transfers: Sit to/from Stand Sit to Stand: Min guard;Supervision         General transfer comment: Assist to rise, stabilize, control descent. VCs safety, hand placement.   Ambulation/Gait Ambulation/Gait assistance: Min guard Ambulation Distance (Feet): 85 Feet Assistive device: Rolling walker (2 wheeled) Gait Pattern/deviations: Step-to pattern Gait velocity: WFL   General Gait Details: close guard for safety. VCs safety, sequence. slow gait speed. HR 124 bpm, O2 97% RA  No c/o's   Stairs            Wheelchair Mobility    Modified Rankin (Stroke Patients Only)       Balance                                    Cognition Arousal/Alertness: Awake/alert Behavior During Therapy: WFL for tasks assessed/performed Overall Cognitive Status: Within Functional Limits for tasks assessed                      Exercises General Exercises - Lower Extremity Heel Slides: AAROM;Right;5 reps;Supine    General  Comments        Pertinent Vitals/Pain Pain Assessment: 0-10 Pain Score: 4  Pain Location: R hip Pain Descriptors / Indicators: Sore;Tender Pain Intervention(s): Monitored during session;Premedicated before session;Repositioned;Ice applied    Home Living Family/patient expects to be discharged to:: Private residence Living Arrangements: Spouse/significant other Available Help at Discharge: Family Type of Home: Mobile home Home Access: Stairs to enter Entrance Stairs-Rails: Right;Left;Can reach both Home Layout: One level Home Equipment: Environmental consultant - 2 wheels;Cane - single point;Bedside commode;Adaptive equipment      Prior Function Level of Independence: Independent          PT Goals (current goals can now be found in the care plan section) Acute Rehab PT Goals Patient Stated Goal: less pain PT Goal Formulation: With patient/family Time For Goal Achievement: 01/04/16 Potential to Achieve Goals: Good Progress towards PT goals: Progressing toward goals    Frequency  7X/week    PT Plan Current plan remains appropriate    Co-evaluation             End of Session Equipment Utilized During Treatment: Gait belt Activity Tolerance: Patient tolerated treatment well Patient left: in bed;with call bell/phone within reach;with family/visitor present     Time: 1440-1455 PT Time Calculation (min) (ACUTE ONLY): 15 min  Charges:  $Gait Training: 8-22 mins  G Codes:      Rica Koyanagi  PTA [facility]  Acute  Rehab Pager      [phone]

## 2015-12-28 NOTE — Progress Notes (Signed)
Subjective: Pt stable but has been having nausea   Objective: Vital signs in last 24 hours: Temp:  [95.7 F (35.4 C)-98.5 F (36.9 C)] 98.5 F (36.9 C) (03/18 0615) Pulse Rate:  [40-95] 57 (03/18 0615) Resp:  [9-24] 16 (03/18 0615) BP: (99-162)/(64-111) 140/71 mmHg (03/18 0615) SpO2:  [98 %-100 %] 100 % (03/18 0615)  Intake/Output from previous day: 03/17 0701 - 03/18 0700 In: 2661.3 [I.V.:2496.3; IV Piggyback:165] Out: 525 [Urine:225; Blood:300] Intake/Output this shift:    Exam:  Sensation intact distally Intact pulses distally Dorsiflexion/Plantar flexion intact  Labs:  Recent Labs  12/28/15 0526  HGB 11.1*    Recent Labs  12/28/15 0526  WBC 10.6*  RBC 3.73*  HCT 32.7*  PLT 141*    Recent Labs  12/28/15 0526  NA 137  K 4.1  CL 104  CO2 27  BUN 11  CREATININE 0.78  GLUCOSE 130*  CALCIUM 8.6*   No results for input(s): LABPT, INR in the last 72 hours.  Assessment/Plan: Pt ok  - may change pain meds - continue iv phenergan   Ibraham Levi SCOTT 12/28/2015, 8:44 AM

## 2015-12-28 NOTE — Progress Notes (Signed)
Occupational Therapy Evaluation Patient Details Name: [name] [name] MRN: [id number] DOB: [birth year] Today's Date: 12/28/2015    History of Present Illness 55 yo female s/p R THA-direct anterior 12/27/15.    Clinical Impression   Patient presents to OT with decreased ADL independence due to the diagnosis above and the functional deficits below. She will benefit from skilled OT to maximize function and to facilitate a safe discharge. OT will follow.    Follow Up Recommendations  No OT follow up;Supervision/Assistance - 24 hour    Equipment Recommendations  None recommended by OT    Recommendations for Other Services       Precautions / Restrictions Precautions Precautions: Fall Restrictions Weight Bearing Restrictions: No RLE Weight Bearing: Weight bearing as tolerated      Mobility Bed Mobility            General bed mobility comments: NT -- up in recliner  Transfers Overall transfer level: Needs assistance Equipment used: Rolling walker (2 wheeled) Transfers: Sit to/from Stand Sit to Stand: Min guard             Balance                                            ADL Overall ADL's : Needs assistance/impaired Eating/Feeding: Independent;Sitting   Grooming: Wash/dry hands;Wash/dry face;Oral care;Min guard;Standing   Upper Body Bathing: Set up;Sitting   Lower Body Bathing: Moderate assistance;Sit to/from stand   Upper Body Dressing : Set up;Sitting   Lower Body Dressing: Moderate assistance;Sit to/from stand   Toilet Transfer: Min guard;Ambulation;Comfort height toilet;Grab bars;RW   Toileting- Water quality scientist and Hygiene: Min guard;Sit to/from stand       Functional mobility during ADLs: Min guard;Rolling walker General ADL Comments: Patient received up in recliner. Still has foley catheter in and IV. Patient with nausea but agreeable to OT session. Educated on role of OT. Patient reports she purchased AE kit  (reacher, long sponge, sock aide, shoe horn, dressing stick) but does not know how to use it and does not have it with her. Will bring demo kit next session to educate patient. Patient has BSC at home but does not think it will fit over toilet. She has a sink next to toilet. Practiced regular toilet with grab bar to simulate for home and patient did very well. Stood at sink to groom, then back to chair. Patient has tub/shower. Will educate on tub transfer technique next session.     Vision     Perception     Praxis      Pertinent Vitals/Pain Pain Assessment: 0-10 Pain Score: 5  Pain Location: R hip Pain Descriptors / Indicators: Sore;Tightness Pain Intervention(s): Limited activity within patient's tolerance;Monitored during session;Premedicated before session;Repositioned;Ice applied     Hand Dominance Right   Extremity/Trunk Assessment Upper Extremity Assessment Upper Extremity Assessment: Overall WFL for tasks assessed   Lower Extremity Assessment Lower Extremity Assessment: Defer to PT evaluation    Cervical / Trunk Assessment Cervical / Trunk Assessment: Normal   Communication Communication Communication: No difficulties   Cognition Arousal/Alertness: Awake/alert Behavior During Therapy: WFL for tasks assessed/performed Overall Cognitive Status: Within Functional Limits for tasks assessed                     General Comments       Exercises  Shoulder Instructions      Home Living Family/patient expects to be discharged to:: Private residence Living Arrangements: Spouse/significant other Available Help at Discharge: Family Type of Home: Mobile home Home Access: Stairs to enter Entrance Stairs-Number of Steps: 2+1 Entrance Stairs-Rails: Right;Left;Can reach both Home Layout: One level     Bathroom Shower/Tub: Tub/shower unit;Curtain Shower/tub characteristics: Architectural technologist: Standard Bathroom Accessibility: Yes How Accessible:  Accessible via walker Home Equipment: Gilford Rile - 2 wheels;Cane - single point;Bedside commode;Adaptive equipment Adaptive Equipment: Reacher;Sock aid;Long-handled shoe horn;Long-handled sponge;Other (Comment) (dressing stick)        Prior Functioning/Environment Level of Independence: Independent             OT Diagnosis: Acute pain   OT Problem List: Decreased strength;Decreased range of motion;Decreased activity tolerance;Decreased knowledge of use of DME or AE;Pain   OT Treatment/Interventions: Self-care/ADL training;DME and/or AE instruction;Therapeutic activities;Patient/family education    OT Goals(Current goals can be found in the care plan section) Acute Rehab OT Goals Patient Stated Goal: less pain OT Goal Formulation: With patient Time For Goal Achievement: 01/04/16 Potential to Achieve Goals: Good ADL Goals Pt Will Perform Lower Body Bathing: with supervision;with adaptive equipment;sit to/from stand Pt Will Perform Lower Body Dressing: with supervision;with adaptive equipment;sit to/from stand Pt Will Transfer to Toilet: with supervision;ambulating;regular height toilet Pt Will Perform Toileting - Clothing Manipulation and hygiene: with supervision;sit to/from stand Pt Will Perform Tub/Shower Transfer: with min assist;with caregiver independent in assisting;ambulating;rolling walker  OT Frequency: Min 2X/week   Barriers to D/C:            Co-evaluation              End of Session Equipment Utilized During Treatment: Surveyor, mining Communication: Mobility status  Activity Tolerance: Patient tolerated treatment well Patient left: in chair;with call bell/phone within reach;with chair alarm set;with family/visitor present   Time: 2419-9144 OT Time Calculation (min): 26 min Charges:  OT General Charges $OT Visit: 1 Procedure OT Evaluation $OT Eval Low Complexity: 1 Procedure OT Treatments $Self Care/Home Management : 8-22 mins G-Codes:    Avani Sensabaugh,  Tiffny Gemmer A 2016/01/07, 2:06 PM

## 2015-12-29 MED ORDER — PROMETHAZINE HCL 25 MG/ML IJ SOLN: 12.5000 mg | Freq: Four times a day (QID) | INTRAMUSCULAR | Status: DC | PRN

## 2015-12-29 MED ORDER — HYDROMORPHONE HCL 2 MG PO TABS: 1.0000 mg | ORAL_TABLET | ORAL | Status: DC | PRN

## 2015-12-29 NOTE — Progress Notes (Signed)
CSW consulted for SNF placement. PT has recommended HHPT. RNCM will assist with d/c planning needs.  Werner Lean LCSW [phone]

## 2015-12-29 NOTE — Progress Notes (Signed)
Subjective: Patient stable progressing well with therapy walking halls with minimal pain   Objective: Vital signs in last 24 hours: Temp:  [98.7 F (37.1 C)-98.8 F (37.1 C)] 98.8 F (37.1 C) (03/19 1500) Pulse Rate:  [79-94] 94 (03/19 1500) Resp:  [16-18] 16 (03/19 1500) BP: (115-121)/(70-83) 121/83 mmHg (03/19 1500) SpO2:  [97 %-100 %] 100 % (03/19 1500)  Intake/Output from previous day: 03/18 0701 - 03/19 0700 In: 1826.3 [P.O.:240; I.V.:1586.3] Out: 1950 [Urine:1950] Intake/Output this shift: Total I/O In: 480 [P.O.:480] Out: 3500 [Urine:3500]  Exam:  Neurologically intact  Labs:  Recent Labs  12/28/15 0526  HGB 11.1*    Recent Labs  12/28/15 0526  WBC 10.6*  RBC 3.73*  HCT 32.7*  PLT 141*    Recent Labs  12/28/15 0526  NA 137  K 4.1  CL 104  CO2 27  BUN 11  CREATININE 0.78  GLUCOSE 130*  CALCIUM 8.6*   No results for input(s): LABPT, INR in the last 72 hours.  Assessment/Plan: Plan discharge today   Caidance Sybert SCOTT 12/29/2015, 6:24 PM

## 2015-12-29 NOTE — Plan of Care (Signed)
Problem: Activity: Goal: Ability to tolerate increased activity will improve Outcome: Progressing Working with PT/ OT.  Ambulates around unit using FWW--steady gait.    Problem: Physical Regulation: Goal: Postoperative complications will be avoided or minimized Outcome: Progressing .  Problem: Pain Management: Goal: Pain level will decrease with appropriate interventions Outcome: Completed/Met Date Met:  12/29/15 0.5 mg PO Dilaudid effective with pain control.

## 2015-12-29 NOTE — Progress Notes (Signed)
Physical Therapy Treatment Patient Details Name: [name] [name] MRN: [id number] DOB: 1961/06/14 Today's Date: 12/29/2015    History of Present Illness 55 yo female s/p R THA-direct anterior 12/27/15.     PT Comments    Patient reports ambulating in hall with RW. Progressing well. [atient hopeful for DC today.   Follow Up Recommendations  Home health PT     Equipment Recommendations  None recommended by PT    Recommendations for Other Services       Precautions / Restrictions Precautions Precautions: None Restrictions Weight Bearing Restrictions: No RLE Weight Bearing: Weight bearing as tolerated    Mobility  Bed Mobility               General bed mobility comments: in recliner  Transfers Overall transfer level: Modified independent Equipment used: Rolling walker (2 wheeled) Transfers: Sit to/from Stand Sit to Stand: Supervision         General transfer comment: Ambulated 200 feet with Mod I and RW  Ambulation/Gait Ambulation/Gait assistance: Modified independent (Device/Increase time) Ambulation Distance (Feet): 200 Feet Assistive device: Rolling walker (2 wheeled) Gait Pattern/deviations: Step-to pattern Gait velocity: WFL       Stairs Stairs: Yes Stairs assistance: Min guard Stair Management: One rail Left;Step to pattern;Forwards;With cane Number of Stairs: 3 General stair comments: simulated cane on R side  Wheelchair Mobility    Modified Rankin (Stroke Patients Only)       Balance                                    Cognition Arousal/Alertness: Awake/alert Behavior During Therapy: WFL for tasks assessed/performed Overall Cognitive Status: Within Functional Limits for tasks assessed                      Exercises Total Joint Exercises Ankle Circles/Pumps: AROM;Both;10 reps Short Arc Quad: AROM;Right;10 reps Heel Slides: AROM;Right;10 reps Hip ABduction/ADduction: AROM;Right;10 reps Long Arc Quad:  AROM;Right;10 reps    General Comments        Pertinent Vitals/Pain Pain Assessment: 0-10 Pain Score: 2  Pain Location: R hip and thigh Pain Descriptors / Indicators: Tightness Pain Intervention(s): Monitored during session;Premedicated before session;Ice applied    Home Living                      Prior Function            PT Goals (current goals can now be found in the care plan section) Progress towards PT goals: Progressing toward goals    Frequency  7X/week    PT Plan Current plan remains appropriate    Co-evaluation             End of Session   Activity Tolerance: Patient tolerated treatment well Patient left: in chair;with call bell/phone within reach     Time: 0919-0945 PT Time Calculation (min) (ACUTE ONLY): 26 min  Charges:  $Gait Training: 8-22 mins $Therapeutic Exercise: 8-22 mins                    G Codes:      [name] [name] 12/29/2015, 11:38 AM

## 2015-12-29 NOTE — Progress Notes (Signed)
Occupational Therapy Treatment and Discharge Patient Details Name: [name] [name] MRN: [id number] DOB: 23-Jun-1961 Today's Date: 12/29/2015    History of present illness 55 yo female s/p R THA-direct anterior 12/27/15.    OT comments  This 55 yo female admitted with above presents to acute OT today with all education completed and goals met, we will D/C from acute OT.  Follow Up Recommendations  No OT follow up    Equipment Recommendations  None recommended by OT       Precautions / Restrictions Precautions Precautions: None Restrictions Weight Bearing Restrictions: No RLE Weight Bearing: Weight bearing as tolerated       Mobility Bed Mobility               General bed mobility comments: Pt sitting on EOB upon my entering room  Transfers Overall transfer level: Modified independent Equipment used: Rolling walker (2 wheeled) Transfers: Sit to/from Stand Sit to Stand: Supervision         General transfer comment: Ambulated 200 feet with Mod I and RW        ADL Overall ADL's : Needs assistance/impaired                     Lower Body Dressing: Modified independent;With adaptive equipment;Sit to/from stand   Toilet Transfer: Modified Independent;Ambulation;RW (bed>hallway 200 feet>recliner)       Tub/ Shower Transfer: Tub Product manager Details (indicate cue type and reason): I explained and demonstrated to pt and family how she would back up to tub, step in with her LLE while holding onto walker or family>sit down on tub seat she has> then swing RLE into tub (family may have to help her with this part right now due to weakness in RLE)   General ADL Comments: Educated pt on use of sock aid, elastic laces, and dressing stick                Cognition   Behavior During Therapy: WFL for tasks assessed/performed Overall Cognitive Status: Within Functional Limits for tasks assessed                                     Pertinent Vitals/ Pain       Pain Assessment: 0-10 Pain Score: 2  Pain Location: right hip Pain Descriptors / Indicators: Aching;Sore Pain Intervention(s): Monitored during session;Repositioned;Premedicated before session         Frequency Min 2X/week     Progress Toward Goals  OT Goals(current goals can now be found in the care plan section)  Progress towards OT goals: Goals met/education completed, patient discharged from [facility] All goals met and education completed, patient discharged from OT services (pt at an overall Mod I level for basic ADLs, except for Min A for RLE in and out of tub)       End of Session Equipment Utilized During Treatment: Rolling walker   Activity Tolerance Patient tolerated treatment well   Patient Left in chair;with call bell/phone within reach (getting ready to work with PT)   Nurse Communication          Time: [phone] OT Time Calculation (min): 40 min  Charges: OT General Charges $OT Visit: 1 Procedure OT Treatments $Self Care/Home Management : 38-52 mins  Almon Register [phone] 12/29/2015, 10:01 AM

## 2015-12-29 NOTE — Progress Notes (Signed)
Utilization Review Completed.Olen Eaves T3/19/2017  

## 2016-01-02 NOTE — Discharge Summary (Signed)
Patient ID: [name] [name] MRN: [id number] DOB/AGE: Nov 24, 1960 54 y.o.  Admit date: 12/27/2015 Discharge date: 01/02/2016  Admission Diagnoses:  Principal Problem:   Osteoarthritis of right hip Active Problems:   Status post total replacement of right hip   S/P total hip arthroplasty   Discharge Diagnoses:  Same  Past Medical History  Diagnosis Date  . Depression   . HLD (hyperlipidemia)   . Tobacco abuse   . OA (osteoarthritis)   . Anxiety disorder   . COPD (chronic obstructive pulmonary disease) ([place])   . Fibroids     Uterine  . Vitiligo 1975  . Wears glasses   . Anxiety   . Broken jaw ([place])     Surgeries: Procedure(s): RIGHT TOTAL HIP ARTHROPLASTY ANTERIOR APPROACH on 12/27/2015   Consultants:    Discharged Condition: Improved  Hospital Course: [name] [name] is an 55 y.o. female who was admitted 12/27/2015 for operative treatment ofOsteoarthritis of right hip. Patient has severe unremitting pain that affects sleep, daily activities, and work/hobbies. After pre-op clearance the patient was taken to the operating room on 12/27/2015 and underwent  Procedure(s): RIGHT TOTAL HIP ARTHROPLASTY ANTERIOR APPROACH.    Patient was given perioperative antibiotics:  Anti-infectives    Start     Dose/Rate Route Frequency Ordered Stop   12/27/15 1700  ceFAZolin (ANCEF) IVPB 1 g/50 mL premix     1 g 100 mL/hr over 30 Minutes Intravenous Every 6 hours 12/27/15 1535 12/28/15 0051   12/27/15 0843  ceFAZolin (ANCEF) IVPB 2 g/50 mL premix     2 g 100 mL/hr over 30 Minutes Intravenous On call to O.R. 12/27/15 0843 12/27/15 1027       Patient was given sequential compression devices, early ambulation, and chemoprophylaxis to prevent DVT.  Patient benefited maximally from hospital stay and there were no complications.    Recent vital signs: No data found.    Recent laboratory studies: No results for input(s): WBC, HGB, HCT, PLT, NA, K, CL, CO2, BUN, CREATININE, GLUCOSE, INR,  CALCIUM in the last 72 hours.  Invalid input(s): PT, 2   Discharge Medications:     Medication List    STOP taking these medications        diclofenac 75 MG EC tablet  Commonly known as:  VOLTAREN     diclofenac sodium 1 % Gel  Commonly known as:  VOLTAREN     traMADol 50 MG tablet  Commonly known as:  ULTRAM      TAKE these medications        buPROPion 150 MG 24 hr tablet  Commonly known as:  WELLBUTRIN XL  Take 1 tablet (150 mg total) by mouth daily.     estradiol 0.075 MG/24HR  Commonly known as:  VIVELLE-DOT  Place 1 patch onto the skin 2 (two) times a week.     HYDROmorphone 2 MG tablet  Commonly known as:  DILAUDID  Take 0.5 tablets (1 mg total) by mouth every 4 (four) hours as needed for severe pain.     loratadine 10 MG tablet  Commonly known as:  CLARITIN  Take 10 mg by mouth daily as needed for allergies.     LORazepam 0.5 MG tablet  Commonly known as:  ATIVAN  TAKE ONE TABLET BY MOUTH EVERY 8 HOURS AS NEEDED     nicotine 21 mg/24hr patch  Commonly known as:  NICODERM CQ - dosed in mg/24 hours  Place 1 patch (21 mg total) onto the skin daily.  promethazine 25 MG/ML injection  Commonly known as:  PHENERGAN  Inject 0.5-1 mLs (12.5-25 mg total) into the vein every 6 (six) hours as needed for nausea or vomiting.        Diagnostic Studies: Dg Pelvis Portable  12/27/2015  CLINICAL DATA:  55 year old female status post right total hip arthroplasty EXAM: DG HIP (WITH OR WITHOUT PELVIS) 1V PORT RIGHT; PORTABLE PELVIS 1-2 VIEWS COMPARISON:  Intraoperative radiographs obtained earlier today FINDINGS: Surgical changes of right total hip arthroplasty. No evidence of immediate hardware complication. The bony pelvis appears intact. Expected postoperative subcutaneous gas. The bowel gas pattern is normal. IMPRESSION: Postsurgical changes of right total hip arthroplasty without evidence of immediate complication. Electronically Signed   By: Jacqulynn Cadet M.D.    On: 12/27/2015 13:05   Dg C-arm 1-60 Min-no Report  12/27/2015  CLINICAL DATA: surgery C-ARM 1-60 MINUTES Fluoroscopy was utilized by the requesting physician.  No radiographic interpretation.   Dg Hip Port Unilat With Pelvis 1v Right  12/27/2015  CLINICAL DATA:  55 year old female status post right total hip arthroplasty EXAM: DG HIP (WITH OR WITHOUT PELVIS) 1V PORT RIGHT; PORTABLE PELVIS 1-2 VIEWS COMPARISON:  Intraoperative radiographs obtained earlier today FINDINGS: Surgical changes of right total hip arthroplasty. No evidence of immediate hardware complication. The bony pelvis appears intact. Expected postoperative subcutaneous gas. The bowel gas pattern is normal. IMPRESSION: Postsurgical changes of right total hip arthroplasty without evidence of immediate complication. Electronically Signed   By: Jacqulynn Cadet M.D.   On: 12/27/2015 13:05   Dg Hip Operative Unilat W Or W/o Pelvis Right  12/27/2015  CLINICAL DATA:  Right total hip replacement EXAM: OPERATIVE RIGHT HIP (WITH PELVIS IF PERFORMED) 2 VIEWS TECHNIQUE: Fluoroscopic spot image(s) were submitted for interpretation post-operatively. COMPARISON:  05/29/2015 FINDINGS: Changes of right hip replacement. No hardware or bony complicating feature. Normal alignment. IMPRESSION: Right hip replacement.  No visible complicating feature. Electronically Signed   By: Rolm Baptise M.D.   On: 12/27/2015 11:46    Disposition: 06-Home-Health Care Svc      Discharge Instructions    Call MD / Call 911    Complete by:  As directed   If you experience chest pain or shortness of breath, CALL 911 and be transported to the hospital emergency room.  If you develope a fever above 101 F, pus (white drainage) or increased drainage or redness at the wound, or calf pain, call your surgeon's office.     Constipation Prevention    Complete by:  As directed   Drink plenty of fluids.  Prune juice may be helpful.  You may use a stool softener, such as Colace  (over the counter) 100 mg twice a day.  Use MiraLax (over the counter) for constipation as needed.     Diet - low sodium heart healthy    Complete by:  As directed      Increase activity slowly as tolerated    Complete by:  As directed               Signed: Mcarthur Rossetti 01/02/2016, 7:55 PM

## 2016-06-03 ENCOUNTER — Other Ambulatory Visit: Payer: Self-pay | Admitting: Physician Assistant

## 2016-06-03 DIAGNOSIS — M545 Low back pain: Secondary | ICD-10-CM

## 2016-06-07 ENCOUNTER — Ambulatory Visit
Admission: RE | Admit: 2016-06-07 | Discharge: 2016-06-07 | Disposition: A | Discharge status: Home / Self Care | Payer: Managed Care, Other (non HMO) | Source: Ambulatory Visit | Attending: Physician Assistant | Admitting: Physician Assistant

## 2016-06-07 DIAGNOSIS — M545 Low back pain: Secondary | ICD-10-CM

## 2016-06-18 ENCOUNTER — Other Ambulatory Visit: Payer: Self-pay | Admitting: Family Medicine

## 2016-06-18 DIAGNOSIS — Z780 Asymptomatic menopausal state: Secondary | ICD-10-CM

## 2016-06-18 MED ORDER — ESTRADIOL 0.075 MG/24HR TD PTTW: 1.0000 | MEDICATED_PATCH | TRANSDERMAL | 11 refills | Status: DC

## 2016-06-24 ENCOUNTER — Telehealth: Payer: Self-pay | Admitting: *Deleted

## 2016-06-24 DIAGNOSIS — Z780 Asymptomatic menopausal state: Secondary | ICD-10-CM

## 2016-06-24 MED ORDER — ESTRADIOL 0.075 MG/24HR TD PTTW: 1.0000 | MEDICATED_PATCH | TRANSDERMAL | 11 refills | Status: DC

## 2016-06-24 NOTE — Telephone Encounter (Signed)
Rx refill for Vivelle Dot had been sent to the wrong pharmacy, pt called back requesting it to be sent to the [facility] on [address].  Rx re-sent to the correct pharmacy per Dr Kennon Rounds order.

## 2016-06-24 NOTE — Telephone Encounter (Signed)
Received Rx refill request for Vivelle Dot from [facility] in [place], rx was sent to the [facility] in [place].  Called pt to verify the correct pharmacy, no answer, left message to call back if the rx needed to be sent to a different location.

## 2016-07-27 ENCOUNTER — Ambulatory Visit (INDEPENDENT_AMBULATORY_CARE_PROVIDER_SITE_OTHER): Payer: Managed Care, Other (non HMO) | Admitting: Orthopaedic Surgery

## 2016-07-27 DIAGNOSIS — M5116 Intervertebral disc disorders with radiculopathy, lumbar region: Secondary | ICD-10-CM

## 2016-07-27 DIAGNOSIS — M5416 Radiculopathy, lumbar region: Secondary | ICD-10-CM

## 2016-07-27 DIAGNOSIS — M545 Low back pain: Secondary | ICD-10-CM | POA: Diagnosis not present

## 2016-08-03 ENCOUNTER — Other Ambulatory Visit (INDEPENDENT_AMBULATORY_CARE_PROVIDER_SITE_OTHER): Payer: Self-pay | Admitting: Physician Assistant

## 2016-08-10 ENCOUNTER — Encounter (INDEPENDENT_AMBULATORY_CARE_PROVIDER_SITE_OTHER): Payer: Self-pay | Admitting: Physical Medicine and Rehabilitation

## 2016-08-10 ENCOUNTER — Ambulatory Visit (INDEPENDENT_AMBULATORY_CARE_PROVIDER_SITE_OTHER): Payer: Managed Care, Other (non HMO) | Admitting: Physical Medicine and Rehabilitation

## 2016-08-10 VITALS — BP 132/86 | HR 57 | Temp 97.4°F

## 2016-08-10 DIAGNOSIS — M47816 Spondylosis without myelopathy or radiculopathy, lumbar region: Secondary | ICD-10-CM

## 2016-08-10 MED ORDER — METHYLPREDNISOLONE ACETATE 80 MG/ML IJ SUSP
80.0000 mg | Freq: Once | INTRAMUSCULAR | Status: AC
  Administered 2016-08-10: 80 mg

## 2016-08-10 MED ORDER — LIDOCAINE HCL (PF) 1 % IJ SOLN
0.3300 mL | Freq: Once | INTRAMUSCULAR | Status: AC
  Administered 2016-08-10: 0.3 mL

## 2016-08-10 NOTE — Progress Notes (Signed)
Office Visit Note  Patient: [name] [name]           Date of Birth: 1960/10/23           MRN: [id number] Visit Date: 08/10/2016              Requested by: Lucille Passy, MD [place], [place] [address] PCP: Arnette Norris, MD   Assessment & Plan: Visit Diagnoses:  1. Spondylosis without myelopathy or radiculopathy, lumbar region     Follow-Up Instructions: Return if symptoms worsen or fail to improve in 2 weeks, follow up with Dr. Ninfa Linden, for Recheck spine.  Orders:  Orders Placed This Encounter  Procedures  . Nerve Block    Meds ordered this encounter  Medications  . lidocaine (PF) (XYLOCAINE) 1 % injection 0.3 mL  . methylPREDNISolone acetate (DEPO-MEDROL) injection 80 mg      Procedures: Lumbar Facet Joint Intra-Articular Injection(s) with Fluoroscopic Guidance  Patient: [name] [name]      Date of Birth: 1961-03-25 MRN: [id number] PCP: Arnette Norris, MD      Visit Date: 08/10/2016   Universal Protocol:    Date/Time: 10/30/173:12 PM  Consent Given By: the patient  Position: PRONE   Additional Comments: Vital signs were monitored before and after the procedure. Patient was prepped and draped in the usual sterile fashion. The correct patient, procedure, and site was verified.   Injection Procedure Details:  Procedure Site One Meds Administered:  Meds ordered this encounter  Medications  . lidocaine (PF) (XYLOCAINE) 1 % injection 0.3 mL  . methylPREDNISolone acetate (DEPO-MEDROL) injection 80 mg     Laterality: Bilateral  Location/Site:  L4-L5  Needle size: 22 guage  Needle type: Spinal  Needle Placement: Articular  Findings:  -Contrast Used: 2 mL iohexol 180 mg iodine/mL   -Comments: Excellent flow of contrast producing a partial arthrogram.  Procedure Details: The fluoroscope beam is vertically oriented in AP, and the inferior recess is visualized beneath the lower pole of the inferior apophyseal process, which represents the  target point for needle insertion. When direct visualization is difficult the target point is located at the medial projection of the vertebral pedicle. The region overlying each aforementioned target is locally anesthetized with a 1 to 2 ml. volume of 1% Lidocaine without Epinephrine.   The spinal needle was inserted into each of the above mentioned facet joints using biplanar fluoroscopic guidance. A 0.25 to 0.5 ml. volume of Isovue-250 was injected and a partial facet joint arthrogram was obtained. A single spot film was obtained of the resulting arthrogram.    One to 1.25 ml of the steroid/anesthetic solution was then injected into each of the facet joints noted above.   Additional Comments:  The patient tolerated the procedure well Dressing: Band-Aid    Post-procedure details: Patient was observed during the procedure. Post-procedure instructions were reviewed.  Patient left the clinic in stable condition.     Other Procedures: No procedures performed   Clinical Data: No additional findings.   Subjective: Chief Complaint  Patient presents with  . Lower Back - Pain    HPI  Did well with last injection x 2 weeks. Went back to work and then pain returned. Pain across back. Switches from left to right. " Toes feel funny" Rt buttock pain and down back of the right leg first thing in the morning. She mainly reports axial low back pain however worse with standing worse with twisting. Prior injection was a transforaminal  epidural injection for more of a disc and nerve problem. She has a right foraminal area at L4 which would not cause any left-sided symptoms. She is very concerned about going back to work and having her symptoms flareup. I told her she probably should follow up with Dr. Ninfa Linden for work restrictions if that is something he is looking at. From a spine perspective she has a good spine functionally and anatomically. She does have some arthritis in this disc  herniation.  Taking Ibuprofen 800 mg  Takes no blood thinner and no dye allergy  Has driver with her today   Review of Systems   Objective: Vital Signs: BP 132/86 (BP Location: Left Arm, Patient Position: Sitting, Cuff Size: Normal)   Pulse (!) 57   Temp 97.4 F (36.3 C) (Oral)   SpO2 98%    Physical Exam  Ortho Exam  Specialty Comments:  No specialty comments available. Imaging: No results found.   PMFS History: Patient Active Problem List   Diagnosis Date Noted  . Osteoarthritis of right hip 12/27/2015  . Status post total replacement of right hip 12/27/2015  . S/P total hip arthroplasty 12/27/2015  . Pre-operative cardiovascular examination 09/16/2015  . Cough 08/27/2014  . Left shoulder pain 08/27/2014  . Depression 08/27/2014  . Right hip pain 06/29/2014  . Nausea with vomiting 06/29/2014  . Vitiligo 04/12/2012  . Surgical menopause 06/16/2011  . MAMMOGRAM, ABNORMAL, LEFT 02/17/2010  . HYPERLIPIDEMIA 02/06/2010  . TOBACCO ABUSE 02/06/2010  . Anxiety and depression 02/06/2010  . OSTEOARTHRITIS 02/06/2010   Past Medical History:  Diagnosis Date  . Anxiety   . Anxiety disorder   . Broken jaw ([place])   . COPD (chronic obstructive pulmonary disease) ([place])   . Depression   . Fibroids    Uterine  . HLD (hyperlipidemia)   . OA (osteoarthritis)   . Tobacco abuse   . Vitiligo 1975  . Wears glasses     Family History  Problem Relation Age of Onset  . Depression Mother   . Cancer Father   . Depression Father   . Miscarriages / Stillbirths Sister    Past Surgical History:  Procedure Laterality Date  . ABDOMINAL HYSTERECTOMY  2011  . DILATION AND CURETTAGE OF UTERUS    . HIP ARTHROSCOPY  2003   right   . MANDIBLE SURGERY  1980  . TOTAL HIP ARTHROPLASTY Right 12/27/2015   Procedure: RIGHT TOTAL HIP ARTHROPLASTY ANTERIOR APPROACH;  Surgeon: Mcarthur Rossetti, MD;  Location: [facility];  Service: Orthopedics;  Laterality: Right;  . TUBAL LIGATION     . 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 Smoking status: Current Every Day Smoker    Packs/day: 0.50    Years: 42.00    Types: Cigarettes  . Smokeless tobacco: Never Used  . Alcohol use No     Comment: history of alcoholism quit 16 years ago   . Drug use:      Comment: Marijuana (uses daily)  . Sexual activity: Not Currently

## 2016-08-10 NOTE — Procedures (Signed)
Lumbar Facet Joint Intra-Articular Injection(s) with Fluoroscopic Guidance  Patient: [name] [name]      Date of Birth: 02-Jan-1961 MRN: [id number] PCP: Arnette Norris, MD      Visit Date: 08/10/2016   Universal Protocol:    Date/Time: 10/30/173:12 PM  Consent Given By: the patient  Position: PRONE   Additional Comments: Vital signs were monitored before and after the procedure. Patient was prepped and draped in the usual sterile fashion. The correct patient, procedure, and site was verified.   Injection Procedure Details:  Procedure Site One Meds Administered:  Meds ordered this encounter  Medications  . lidocaine (PF) (XYLOCAINE) 1 % injection 0.3 mL  . methylPREDNISolone acetate (DEPO-MEDROL) injection 80 mg     Laterality: Bilateral  Location/Site:  L4-L5  Needle size: 22 guage  Needle type: Spinal  Needle Placement: Articular  Findings:  -Contrast Used: 2 mL iohexol 180 mg iodine/mL   -Comments: Excellent flow of contrast producing a partial arthrogram.  Procedure Details: The fluoroscope beam is vertically oriented in AP, and the inferior recess is visualized beneath the lower pole of the inferior apophyseal process, which represents the target point for needle insertion. When direct visualization is difficult the target point is located at the medial projection of the vertebral pedicle. The region overlying each aforementioned target is locally anesthetized with a 1 to 2 ml. volume of 1% Lidocaine without Epinephrine.   The spinal needle was inserted into each of the above mentioned facet joints using biplanar fluoroscopic guidance. A 0.25 to 0.5 ml. volume of Isovue-250 was injected and a partial facet joint arthrogram was obtained. A single spot film was obtained of the resulting arthrogram.    One to 1.25 ml of the steroid/anesthetic solution was then injected into each of the facet joints noted above.   Additional Comments:  The patient tolerated the procedure  well Dressing: Band-Aid    Post-procedure details: Patient was observed during the procedure. Post-procedure instructions were reviewed.  Patient left the clinic in stable condition.

## 2016-08-10 NOTE — Patient Instructions (Signed)

## 2016-09-02 ENCOUNTER — Encounter: Payer: Self-pay | Admitting: Family Medicine

## 2016-09-02 ENCOUNTER — Ambulatory Visit (INDEPENDENT_AMBULATORY_CARE_PROVIDER_SITE_OTHER): Payer: Managed Care, Other (non HMO) | Admitting: Family Medicine

## 2016-09-02 VITALS — BP 120/82 | HR 72 | Temp 97.7°F | Wt 113.0 lb

## 2016-09-02 DIAGNOSIS — F419 Anxiety disorder, unspecified: Secondary | ICD-10-CM

## 2016-09-02 DIAGNOSIS — M5010 Cervical disc disorder with radiculopathy, unspecified cervical region: Secondary | ICD-10-CM

## 2016-09-02 DIAGNOSIS — R531 Weakness: Secondary | ICD-10-CM | POA: Diagnosis not present

## 2016-09-02 DIAGNOSIS — D649 Anemia, unspecified: Secondary | ICD-10-CM | POA: Diagnosis not present

## 2016-09-02 DIAGNOSIS — F418 Other specified anxiety disorders: Secondary | ICD-10-CM | POA: Diagnosis not present

## 2016-09-02 DIAGNOSIS — F329 Major depressive disorder, single episode, unspecified: Secondary | ICD-10-CM

## 2016-09-02 DIAGNOSIS — F32A Depression, unspecified: Secondary | ICD-10-CM

## 2016-09-02 LAB — CBC WITH DIFFERENTIAL/PLATELET
BASOS PCT: 1 %
Basophils Absolute: 87 cells/uL (ref 0–200)
EOS ABS: 87 {cells}/uL (ref 15–500)
Eosinophils Relative: 1 %
HEMATOCRIT: 48.1 % — AB (ref 35.0–45.0)
HEMOGLOBIN: 16.6 g/dL — AB (ref 11.7–15.5)
LYMPHS PCT: 20 %
Lymphs Abs: 1740 cells/uL (ref 850–3900)
MCH: 31 pg (ref 27.0–33.0)
MCHC: 34.5 g/dL (ref 32.0–36.0)
MCV: 89.9 fL (ref 80.0–100.0)
MONO ABS: 783 {cells}/uL (ref 200–950)
MPV: 12.1 fL (ref 7.5–12.5)
Monocytes Relative: 9 %
NEUTROS ABS: 6003 {cells}/uL (ref 1500–7800)
Neutrophils Relative %: 69 %
Platelets: 183 10*3/uL (ref 140–400)
RBC: 5.35 MIL/uL — ABNORMAL HIGH (ref 3.80–5.10)
RDW: 13.4 % (ref 11.0–15.0)
WBC: 8.7 10*3/uL (ref 3.8–10.8)

## 2016-09-02 LAB — FERRITIN: Ferritin: 54 ng/mL (ref 10–232)

## 2016-09-02 MED ORDER — BUPROPION HCL ER (XL) 150 MG PO TB24: 150.0000 mg | ORAL_TABLET | Freq: Every day | ORAL | 6 refills | Status: DC

## 2016-09-02 MED ORDER — LORAZEPAM 0.5 MG PO TABS: 0.5000 mg | ORAL_TABLET | Freq: Three times a day (TID) | ORAL | 0 refills | Status: DC | PRN

## 2016-09-02 MED ORDER — MELOXICAM 15 MG PO TABS: 15.0000 mg | ORAL_TABLET | Freq: Every day | ORAL | 1 refills | Status: DC | PRN

## 2016-09-02 NOTE — Patient Instructions (Signed)
Try the meloxicam daily as needed instead of ibuprofen Take flexeril at night  Neck Exercises Neck exercises can be important for many reasons:  They can help you to improve and maintain flexibility in your neck. This can be especially important as you age.  They can help to make your neck stronger. This can make movement easier.  They can reduce or prevent neck pain.  They may help your upper back. Ask your health care provider which neck exercises would be best for you. Exercises Neck Press  Repeat this exercise 10 times. Do it first thing in the morning and right before bed or as told by your health care provider. 1. Lie on your back on a firm bed or on the floor with a pillow under your head. 2. Use your neck muscles to push your head down on the pillow and straighten your spine. 3. Hold the position as well as you can. Keep your head facing up and your chin tucked. 4. Slowly count to 5 while holding this position. 5. Relax for a few seconds. Then repeat. Isometric Strengthening  Do a full set of these exercises 2 times a day or as told by your health care provider. 1. Sit in a supportive chair and place your hand on your forehead. 2. Push forward with your head and neck while pushing back with your hand. Hold for 10 seconds. 3. Relax. Then repeat the exercise 3 times. 4. Next, do thesequence again, this time putting your hand against the back of your head. Use your head and neck to push backward against the hand pressure. 5. Finally, do the same exercise on either side of your head, pushing sideways against the pressure of your hand. Prone Head Lifts  Repeat this exercise 5 times. Do this 2 times a day or as told by your health care provider. 1. Lie face-down, resting on your elbows so that your chest and upper back are raised. 2. Start with your head facing downward, near your chest. Position your chin either on or near your chest. 3. Slowly lift your head upward. Lift until  you are looking straight ahead. Then continue lifting your head as far back as you can stretch. 4. Hold your head up for 5 seconds. Then slowly lower it to your starting position. Supine Head Lifts  Repeat this exercise 8-10 times. Do this 2 times a day or as told by your health care provider. 1. Lie on your back, bending your knees to point to the ceiling and keeping your feet flat on the floor. 2. Lift your head slowly off the floor, raising your chin toward your chest. 3. Hold for 5 seconds. 4. Relax and repeat. Scapular Retraction  Repeat this exercise 5 times. Do this 2 times a day or as told by your health care provider. 1. Stand with your arms at your sides. Look straight ahead. 2. Slowly pull both shoulders backward and downward until you feel a stretch between your shoulder blades in your upper back. 3. Hold for 10-30 seconds. 4. Relax and repeat. Contact a health care provider if:  Your neck pain or discomfort gets much worse when you do an exercise.  Your neck pain or discomfort does not improve within 2 hours after you exercise. If you have any of these problems, stop exercising right away. Do not do the exercises again unless your health care provider says that you can. Get help right away if:  You develop sudden, severe neck pain. If this  happens, stop exercising right away. Do not do the exercises again unless your health care provider says that you can. Exercises Neck Stretch  Repeat this exercise 3-5 times. 1. Do this exercise while standing or while sitting in a chair. 2. Place your feet flat on the floor, shoulder-width apart. 3. Slowly turn your head to the right. Turn it all the way to the right so you can look over your right shoulder. Do not tilt or tip your head. 4. Hold this position for 10-30 seconds. 5. Slowly turn your head to the left, to look over your left shoulder. 6. Hold this position for 10-30 seconds. Neck Retraction  Repeat this exercise 8-10  times. Do this 3-4 times a day or as told by your health care provider. 1. Do this exercise while standing or while sitting in a sturdy chair. 2. Look straight ahead. Do not bend your neck. 3. Use your fingers to push your chin backward. Do not bend your neck for this movement. Continue to face straight ahead. If you are doing the exercise properly, you will feel a slight sensation in your throat and a stretch at the back of your neck. 4. Hold the stretch for 1-2 seconds. Relax and repeat. This information is not intended to replace advice given to you by your health care provider. Make sure you discuss any questions you have with your health care provider. Document Released: 09/09/2015 Document Revised: 03/05/2016 Document Reviewed: 04/08/2015 Elsevier Interactive Patient Education  2017 [employer].

## 2016-09-02 NOTE — Progress Notes (Signed)
Subjective:    Patient ID: [name] [name], female    DOB: 1960/12/21, 55 y.o.   MRN: [id number]  HPI This is a 55 yo female, accompanied by her daughter, who presents today with headache at the back of her head for about 6 months. Headache pain is worse in the morning. Taking ibuprofen 800 mg and cyclobenzaprine without complete resolution. Takes tramadol 1-2 times a day for chronic back and hip pain. Feels swelling on right side of occipital area. Has felt weak and shaky for same amount of time. Has been on neurontin in the past, did not tolerate at high doses 900 mg TID. Cervical films 09/24/14 showed osteoarthritic change more severe at C6-7, no fracture or spondylolisthesis. She has stretching exercises to do for her back but raising left arm over head results in numbness and tingling of arm.   Had right total hip replacement 12/27/15 and did well initially until she developed lumbar back pain. Has follow up appointment with surgeon, Dr. Kathrynn Speed in the next couple of weeks.  Of note, CBC from 3/17 (post op) showed anemia and she received a blood transfusion  Has felt nauseous for 2 days with vomiting x 2-3. Had some acid reflux and took some Pepto Bismol with relief. No nausea currently.  Does not like to take medication and will take wellbutrin for several months and then stops for several months. Feels like she needs to go back on her Wellbutrin due to situational stress. Also requests refill of lorazepam. She uses this sparingly and last had filled 2/17. She has had trouble with being out of work so much of this year.   Past Medical History:  Diagnosis Date  . Anxiety   . Anxiety disorder   . Broken jaw ([place])   . COPD (chronic obstructive pulmonary disease) ([place])   . Depression   . Fibroids    Uterine  . HLD (hyperlipidemia)   . OA (osteoarthritis)   . Tobacco abuse   . Vitiligo 1975  . Wears glasses    Past Surgical History:  Procedure Laterality Date  . ABDOMINAL  HYSTERECTOMY  2011  . DILATION AND CURETTAGE OF UTERUS    . HIP ARTHROSCOPY  2003   right   . MANDIBLE SURGERY  1980  . TOTAL HIP ARTHROPLASTY Right 12/27/2015   Procedure: RIGHT TOTAL HIP ARTHROPLASTY ANTERIOR APPROACH;  Surgeon: Mcarthur Rossetti, MD;  Location: [facility];  Service: Orthopedics;  Laterality: Right;  . TUBAL LIGATION    . URETER SURGERY  2011   Family History  Problem Relation Age of Onset  . Depression Mother   . Cancer Father   . Depression Father   . Miscarriages / [place] Sister    Social History  Substance Use Topics  . Smoking status: Current Every Day Smoker    Packs/day: 0.50    Years: 42.00    Types: Cigarettes  . Smokeless tobacco: Never Used  . Alcohol use No     Comment: history of alcoholism quit 16 years ago       Review of Systems Per HPI    Objective:   Physical Exam  Constitutional: She is oriented to person, place, and time. She appears well-developed and well-nourished. No distress.  HENT:  Head: Normocephalic and atraumatic.  Eyes: Conjunctivae are normal.  Neck: Spinous process tenderness and muscular tenderness present.  Cardiovascular: Normal rate.   Pulmonary/Chest: Effort normal.  Neurological: She is alert and oriented to person, place, and time.  Skin: Skin is warm and dry. She is not diaphoretic.  Psychiatric: She has a normal mood and affect. Her behavior is normal. Judgment and thought content normal.  Vitals reviewed.     BP 120/82   Pulse 72   Temp 97.7 F (36.5 C)   Wt 113 lb (51.3 kg)   SpO2 97%   BMI 22.82 kg/m  Wt Readings from Last 3 Encounters:  09/02/16 113 lb (51.3 kg)  12/27/15 116 lb (52.6 kg)  12/20/15 116 lb (52.6 kg)       Assessment & Plan:  1. Cervical disc disorder with radiculopathy of cervical region - will try different NSAID, instructed not to take other NSAIDs while taking meloxicam - recommended she consistently take cyclobenzaprine at bedtime - encouraged stretching  -  meloxicam (MOBIC) 15 MG tablet; Take 1 tablet (15 mg total) by mouth daily as needed for pain.  Dispense: 30 tablet; Refill: 1  2. Anemia, unspecified type - CBC with Differential/Platelet - Comprehensive metabolic panel - Ferritin  3. Weakness - CBC with Differential/Platelet - Comprehensive metabolic panel  4. Anxiety and depression - encouraged regular exercise - LORazepam (ATIVAN) 0.5 MG tablet; Take 1 tablet (0.5 mg total) by mouth every 8 (eight) hours as needed. for anxiety  Dispense: 30 tablet; Refill: 0 - buPROPion (WELLBUTRIN XL) 150 MG 24 hr tablet; Take 1 tablet (150 mg total) by mouth daily.  Dispense: 90 tablet; Refill: 6 - follow up as scheduled with Dr. Deborra Medina next month  Clarene Reamer, FNP-BC  [facility] at [facility], [place]  09/02/2016 3:19 PM

## 2016-09-03 LAB — COMPREHENSIVE METABOLIC PANEL
ALT: 11 U/L (ref 6–29)
AST: 15 U/L (ref 10–35)
Albumin: 4.4 g/dL (ref 3.6–5.1)
Alkaline Phosphatase: 60 U/L (ref 33–130)
BILIRUBIN TOTAL: 0.4 mg/dL (ref 0.2–1.2)
BUN: 14 mg/dL (ref 7–25)
CHLORIDE: 105 mmol/L (ref 98–110)
CO2: 26 mmol/L (ref 20–31)
CREATININE: 1.22 mg/dL — AB (ref 0.50–1.05)
Calcium: 10.3 mg/dL (ref 8.6–10.4)
GLUCOSE: 83 mg/dL (ref 65–99)
Potassium: 4.2 mmol/L (ref 3.5–5.3)
Sodium: 141 mmol/L (ref 135–146)
Total Protein: 6.8 g/dL (ref 6.1–8.1)

## 2016-09-08 ENCOUNTER — Other Ambulatory Visit (INDEPENDENT_AMBULATORY_CARE_PROVIDER_SITE_OTHER): Payer: Self-pay

## 2016-09-08 ENCOUNTER — Ambulatory Visit (INDEPENDENT_AMBULATORY_CARE_PROVIDER_SITE_OTHER): Payer: Managed Care, Other (non HMO) | Admitting: Orthopaedic Surgery

## 2016-09-08 DIAGNOSIS — M5441 Lumbago with sciatica, right side: Secondary | ICD-10-CM

## 2016-09-08 DIAGNOSIS — M5442 Lumbago with sciatica, left side: Secondary | ICD-10-CM

## 2016-09-08 DIAGNOSIS — G8929 Other chronic pain: Secondary | ICD-10-CM

## 2016-09-08 DIAGNOSIS — M542 Cervicalgia: Secondary | ICD-10-CM | POA: Diagnosis not present

## 2016-09-08 NOTE — Progress Notes (Signed)
Office Visit Note   Patient: [name] [name]           Date of Birth: 10-14-1960           MRN: [id number] Visit Date: 09/08/2016              Requested by: Lucille Passy, MD [place], [place] [address] PCP: Arnette Norris, MD   Assessment & Plan: Visit Diagnoses:  1. Chronic bilateral low back pain with bilateral sciatica   2. Cervicalgia     Plan: At this point given the severity of her neck pain and questionable whether she has mastoiditis I would like to send her for an MRI of cervical spine to rule out herniated disc. I'm also going to send her to physical therapy to work on modalities to hopefully decrease her neck pain and decrease her low back pain. We'll see her back after the MRI is obtained of her cervical spine. Of note MRI of her lumbar spine does show right-sided findings at L3-L4 spondylolisthesis. This can be contributing to her pain as well. We'll continue all her same medications.  Follow-Up Instructions: Return in about 2 weeks (around 09/22/2016).   Orders:  No orders of the defined types were placed in this encounter.  No orders of the defined types were placed in this encounter.     Procedures: No procedures performed   Clinical Data: No additional findings.   Subjective: Chief Complaint  Patient presents with  . Lower Back - Pain    Patient following up back injection with [place]. She states injection did not help at all. States it actually made her pain worse.    She reports that her neck is been hurting her after severe migraines. Point up to the base of her skull and down her next source of pain. She also had a facet injection by Dr. Ernestina Patches few weeks ago said that didn't help at all and she actually is hurting worse states. She gets pain going down both her legs and numbness and tingling in her feet. Her primary care physician of her meloxicam Flexeril and tramadol. She does not take medicine she states. She's been to physical therapy in  the past but is been a long time she like to consider this as well. HPI  Review of Systems Agony for chest pain, shortness of breath, fever, chills, nausea, vomiting. Positive for headache and bilateral foot numbness.  Objective: Vital Signs: There were no vitals taken for this visit.  Physical Exam He is alert and oriented 3. Ortho Exam Just watching her move her neck she can move her neck easily with lateral rotation bending laterality to palpate her spinous muscles in the midline withdrawals significantly to pain and it seems to be out of proportion of exam. I cannot palpate any deficits in the spine at the neck. X-rays in the past did not show any acute findings. She also has pain with flexion extension of her lumbar spine and positive straight leg raise bilaterally. Specialty Comments:  No specialty comments available.  Imaging: No results found.   PMFS History: Patient Active Problem List   Diagnosis Date Noted  . Osteoarthritis of right hip 12/27/2015  . Status post total replacement of right hip 12/27/2015  . S/P total hip arthroplasty 12/27/2015  . Pre-operative cardiovascular examination 09/16/2015  . Cough 08/27/2014  . Left shoulder pain 08/27/2014  . Depression 08/27/2014  . Right hip pain 06/29/2014  . Nausea with vomiting 06/29/2014  .  Vitiligo 04/12/2012  . Surgical menopause 06/16/2011  . MAMMOGRAM, ABNORMAL, LEFT 02/17/2010  . HYPERLIPIDEMIA 02/06/2010  . TOBACCO ABUSE 02/06/2010  . Anxiety and depression 02/06/2010  . OSTEOARTHRITIS 02/06/2010   Past Medical History:  Diagnosis Date  . Anxiety   . Anxiety disorder   . Broken jaw ([place])   . COPD (chronic obstructive pulmonary disease) ([place])   . Depression   . Fibroids    Uterine  . HLD (hyperlipidemia)   . OA (osteoarthritis)   . Tobacco abuse   . Vitiligo 1975  . Wears glasses     Family History  Problem Relation Age of Onset  . Depression Mother   . Cancer Father   . Depression Father     . Miscarriages / Stillbirths Sister     Past Surgical History:  Procedure Laterality Date  . ABDOMINAL HYSTERECTOMY  2011  . DILATION AND CURETTAGE OF UTERUS    . HIP ARTHROSCOPY  2003   right   . MANDIBLE SURGERY  1980  . TOTAL HIP ARTHROPLASTY Right 12/27/2015   Procedure: RIGHT TOTAL HIP ARTHROPLASTY ANTERIOR APPROACH;  Surgeon: Mcarthur Rossetti, MD;  Location: [facility];  Service: Orthopedics;  Laterality: Right;  . TUBAL LIGATION    . 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 Smoking status: Current Every Day Smoker    Packs/day: 1.00    Years: 42.00    Types: Cigarettes  . Smokeless tobacco: Never Used  . Alcohol use No     Comment: history of alcoholism quit 16 years ago   . Drug use:      Comment: Marijuana (uses daily)  . Sexual activity: Not Currently

## 2016-09-18 ENCOUNTER — Ambulatory Visit
Admission: RE | Admit: 2016-09-18 | Discharge: 2016-09-18 | Disposition: A | Discharge status: Home / Self Care | Payer: Managed Care, Other (non HMO) | Source: Ambulatory Visit | Attending: Orthopaedic Surgery | Admitting: Orthopaedic Surgery

## 2016-09-18 DIAGNOSIS — M542 Cervicalgia: Secondary | ICD-10-CM

## 2016-09-22 ENCOUNTER — Ambulatory Visit (INDEPENDENT_AMBULATORY_CARE_PROVIDER_SITE_OTHER): Payer: Managed Care, Other (non HMO) | Admitting: Family Medicine

## 2016-09-22 ENCOUNTER — Encounter: Payer: Self-pay | Admitting: Family Medicine

## 2016-09-22 ENCOUNTER — Ambulatory Visit (INDEPENDENT_AMBULATORY_CARE_PROVIDER_SITE_OTHER)
Admission: RE | Admit: 2016-09-22 | Discharge: 2016-09-22 | Disposition: A | Discharge status: Home / Self Care | Payer: Managed Care, Other (non HMO) | Source: Ambulatory Visit | Attending: Family Medicine | Admitting: Family Medicine

## 2016-09-22 VITALS — BP 124/88 | HR 87 | Temp 97.7°F | Ht 59.25 in | Wt 113.5 lb

## 2016-09-22 DIAGNOSIS — J410 Simple chronic bronchitis: Secondary | ICD-10-CM

## 2016-09-22 DIAGNOSIS — E7800 Pure hypercholesterolemia, unspecified: Secondary | ICD-10-CM

## 2016-09-22 DIAGNOSIS — Z01419 Encounter for gynecological examination (general) (routine) without abnormal findings: Secondary | ICD-10-CM | POA: Insufficient documentation

## 2016-09-22 DIAGNOSIS — Z23 Encounter for immunization: Secondary | ICD-10-CM

## 2016-09-22 DIAGNOSIS — E8940 Asymptomatic postprocedural ovarian failure: Secondary | ICD-10-CM | POA: Diagnosis not present

## 2016-09-22 DIAGNOSIS — F329 Major depressive disorder, single episode, unspecified: Secondary | ICD-10-CM

## 2016-09-22 DIAGNOSIS — F32A Depression, unspecified: Secondary | ICD-10-CM

## 2016-09-22 DIAGNOSIS — M5090 Cervical disc disorder, unspecified, unspecified cervical region: Secondary | ICD-10-CM | POA: Insufficient documentation

## 2016-09-22 DIAGNOSIS — Z1211 Encounter for screening for malignant neoplasm of colon: Secondary | ICD-10-CM

## 2016-09-22 DIAGNOSIS — F419 Anxiety disorder, unspecified: Secondary | ICD-10-CM

## 2016-09-22 DIAGNOSIS — F418 Other specified anxiety disorders: Secondary | ICD-10-CM

## 2016-09-22 MED ORDER — BUPROPION HCL ER (XL) 150 MG PO TB24: 150.0000 mg | ORAL_TABLET | Freq: Every day | ORAL | 6 refills | Status: DC

## 2016-09-22 NOTE — Patient Instructions (Signed)
Great to see you. Happy Holidays! Please call to schedule your mammogram. We will call you with your results from today.

## 2016-09-22 NOTE — Progress Notes (Signed)
Pre visit review using our clinic review tool, if applicable. No additional management support is needed unless otherwise documented below in the visit note. 

## 2016-09-22 NOTE — Assessment & Plan Note (Signed)
Reviewed preventive care protocols, scheduled due services, and updated immunizations Discussed nutrition, exercise, diet, and healthy lifestyle.  Refusing colonoscopy but agrees to IFOB- orders entered. She will call to schedule her mammogram.  Labs today.

## 2016-09-22 NOTE — Assessment & Plan Note (Signed)
Well controlled on current rx. No changes made today. 

## 2016-09-22 NOTE — Assessment & Plan Note (Signed)
Labs today

## 2016-09-22 NOTE — Assessment & Plan Note (Signed)
With new right upper back pain with deep inspiration CXR today. The patient indicates understanding of these issues and agrees with the plan.

## 2016-09-22 NOTE — Progress Notes (Signed)
Subjective:   Patient ID: [name] [name], female    DOB: 31-Jul-1961, 55 y.o.   MRN: [id number]  [name] [name] is a pleasant 55 y.o. year old female who presents to clinic today with Annual Exam and Shoulder Pain (when taking a deep breath)  on 09/22/2016  HPI:  Remote h/o hysterectomy- has been using estrogen patch since her hysterectomy and feels it has been very effective.  Due for a mammogram Has never had a colonoscopy  No family history of breast cancer.  Seeing Dr. Rush Farmer tomorrow for persistent back pain.  Feels meloxicam has been helping. She still hasnt been able to return to work which has been frustrating to her.  Feels symptoms of anxiety and depression well controlled with Wellbutrin.  Right upper back/shoulder pain with deep inspiration- has been ongoing for weeks.  Has a chronic "smoker's cough."  She is unsure if this has changed. Lab Results  Component Value Date   CHOL 222 (H) 08/22/2013   HDL 47.20 08/22/2013   LDLCALC 137 (H) 02/06/2010   LDLDIRECT 153.4 08/22/2013   TRIG 132.0 08/22/2013   CHOLHDL 5 08/22/2013   Lab Results  Component Value Date   ALT 11 09/02/2016   AST 15 09/02/2016   ALKPHOS 60 09/02/2016   BILITOT 0.4 09/02/2016       Current Outpatient Prescriptions on File Prior to Visit  Medication Sig Dispense Refill  . buPROPion (WELLBUTRIN XL) 150 MG 24 hr tablet Take 1 tablet (150 mg total) by mouth daily. 90 tablet 6  . cyclobenzaprine (FLEXERIL) 10 MG tablet TAKE ONE TABLET BY MOUTH EVERY 8 HOURS AS NEEDED FOR  SPASM 40 tablet 1  . estradiol (VIVELLE-DOT) 0.075 MG/24HR Place 1 patch onto the skin 2 (two) times a week. 8 patch 11  . loratadine (CLARITIN) 10 MG tablet Take 10 mg by mouth daily as needed for allergies.    Marland Kitchen LORazepam (ATIVAN) 0.5 MG tablet Take 1 tablet (0.5 mg total) by mouth every 8 (eight) hours as needed. for anxiety 30 tablet 0  . meloxicam (MOBIC) 15 MG tablet Take 1 tablet (15 mg total) by mouth daily as  needed for pain. 30 tablet 1  . traMADol (ULTRAM) 50 MG tablet Take by mouth every 6 (six) hours as needed.     No current facility-administered medications on file prior to visit.     Allergies  Allergen Reactions  . Codeine Nausea Only    Pt states when given codeine Zofran will not work for the nausea     Past Medical History:  Diagnosis Date  . Anxiety   . Anxiety disorder   . Broken jaw ([place])   . COPD (chronic obstructive pulmonary disease) ([place])   . Depression   . Fibroids    Uterine  . HLD (hyperlipidemia)   . OA (osteoarthritis)   . Tobacco abuse   . Vitiligo 1975  . Wears glasses     Past Surgical History:  Procedure Laterality Date  . ABDOMINAL HYSTERECTOMY  2011  . DILATION AND CURETTAGE OF UTERUS    . HIP ARTHROSCOPY  2003   right   . MANDIBLE SURGERY  1980  . TOTAL HIP ARTHROPLASTY Right 12/27/2015   Procedure: RIGHT TOTAL HIP ARTHROPLASTY ANTERIOR APPROACH;  Surgeon: Mcarthur Rossetti, MD;  Location: [facility];  Service: Orthopedics;  Laterality: Right;  . TUBAL LIGATION    . URETER SURGERY  2011    Family History  Problem Relation Age of Onset  .  Depression Mother   . Cancer Father   . Depression Father   . Miscarriages / [place] Sister     Social History   Social History  . Marital status: Married    Spouse name: N/A  . Number of children: 3  . Years of education: N/A   Occupational History  . 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 Smoking status: Current Every Day Smoker    Packs/day: 1.00    Years: 42.00    Types: Cigarettes  . Smokeless tobacco: Never Used  . Alcohol use No     Comment: history of alcoholism quit 16 years ago   . Drug use:      Comment: Marijuana (uses daily)  . Sexual activity: Not Currently   Other Topics Concern  . Not on file   Social History Narrative   Lives with husband in [place] 1 1/2 PPD x 38 years      3 Engineer, civil (consulting) at [employer]      Daily caffeine  use: 6-8      No alcohol      Illicit drug use-marijuana   The PMH, PSH, Social History, Family History, Medications, and allergies have been reviewed in [facility], and have been updated if relevant.   Review of Systems  Constitutional: Negative.   HENT: Negative.   Eyes: Negative.   Respiratory: Positive for cough. Negative for shortness of breath, wheezing and stridor.   Cardiovascular: Negative.   Gastrointestinal: Negative.   Endocrine: Negative.   Musculoskeletal: Positive for arthralgias and neck pain.  Allergic/Immunologic: Negative.   Neurological: Negative.   Hematological: Negative.   Psychiatric/Behavioral: Negative.   All other systems reviewed and are negative.      Objective:    BP 124/88   Pulse 87   Temp 97.7 F (36.5 C) (Oral)   Ht 4' 11.25" (1.505 m)   Wt 113 lb 8 oz (51.5 kg)   SpO2 98%   BMI 22.73 kg/m    Physical Exam  Constitutional: She is oriented to person, place, and time. She appears well-developed and well-nourished. No distress.  HENT:  Head: Normocephalic and atraumatic.  Eyes: Conjunctivae are normal.  Cardiovascular: Normal rate and regular rhythm.   Pulmonary/Chest: Effort normal. No respiratory distress. She has wheezes. She has no rales. She exhibits no tenderness. Right breast exhibits no inverted nipple, no mass, no nipple discharge, no skin change and no tenderness. Left breast exhibits no inverted nipple, no mass, no nipple discharge, no skin change and no tenderness. Breasts are symmetrical.  Musculoskeletal: Normal range of motion.  Neurological: She is alert and oriented to person, place, and time. No cranial nerve deficit.  Skin: Skin is warm and dry. She is not diaphoretic.  Psychiatric: She has a normal mood and affect. Her behavior is normal. Judgment and thought content normal.  Nursing note and vitals reviewed.         Assessment & Plan:   Anxiety and depression  Pure hypercholesterolemia  Well woman exam No  Follow-up on file.

## 2016-09-22 NOTE — Assessment & Plan Note (Signed)
Followed by Dr. Rush Farmer

## 2016-09-22 NOTE — Addendum Note (Signed)
Addended by: Ellamae Sia on: 09/22/2016 08:12 AM   Modules accepted: Orders

## 2016-09-22 NOTE — Assessment & Plan Note (Signed)
Continue estrogen patch.

## 2016-09-23 ENCOUNTER — Ambulatory Visit (INDEPENDENT_AMBULATORY_CARE_PROVIDER_SITE_OTHER): Payer: Managed Care, Other (non HMO) | Admitting: Orthopaedic Surgery

## 2016-09-23 ENCOUNTER — Other Ambulatory Visit (INDEPENDENT_AMBULATORY_CARE_PROVIDER_SITE_OTHER): Payer: Self-pay

## 2016-09-23 DIAGNOSIS — M542 Cervicalgia: Secondary | ICD-10-CM

## 2016-09-23 MED ORDER — HYDROCODONE-ACETAMINOPHEN 5-325 MG PO TABS: 1.0000 | ORAL_TABLET | Freq: Two times a day (BID) | ORAL | 0 refills | Status: DC | PRN

## 2016-09-23 NOTE — Progress Notes (Signed)
The patient is following up after MRI of her cervical spine. She still complaining of neck pain and parascapular pain as well as radicular symptoms going down her left arm. On examination she does exhibit some weak triceps on the left side a positive Spurling sign to the left side.  MRI is reviewed with her and it does show significant degenerative disc disease at C6-C7. There is also disc material into the foramina on the left side.  Given the MRI findings I discussed with her in detail about what is going on. I would like to send her to at least a consultation [facility] in [place]. I did give her prescription for some hydrocodone today. I want her to avoid heavy lifting activities as well. She can also take anti-inflammatories as needed.

## 2016-09-24 ENCOUNTER — Encounter (INDEPENDENT_AMBULATORY_CARE_PROVIDER_SITE_OTHER): Payer: Self-pay | Admitting: Orthopaedic Surgery

## 2016-09-24 LAB — CBC WITH DIFFERENTIAL/PLATELET
BASOS ABS: 0.1 10*3/uL (ref 0.0–0.2)
BASOS: 1 %
EOS (ABSOLUTE): 0.1 10*3/uL (ref 0.0–0.4)
EOS: 2 %
HEMATOCRIT: 48.1 % — AB (ref 34.0–46.6)
HEMOGLOBIN: 16 g/dL — AB (ref 11.1–15.9)
IMMATURE GRANS (ABS): 0 10*3/uL (ref 0.0–0.1)
Immature Granulocytes: 0 %
LYMPHS: 25 %
Lymphocytes Absolute: 1.4 10*3/uL (ref 0.7–3.1)
MCH: 30.4 pg (ref 26.6–33.0)
MCHC: 33.3 g/dL (ref 31.5–35.7)
MCV: 91 fL (ref 79–97)
MONOCYTES: 8 %
Monocytes Absolute: 0.5 10*3/uL (ref 0.1–0.9)
NEUTROS ABS: 3.5 10*3/uL (ref 1.4–7.0)
Neutrophils: 64 %
Platelets: 198 10*3/uL (ref 150–379)
RBC: 5.26 x10E6/uL (ref 3.77–5.28)
RDW: 13.6 % (ref 12.3–15.4)
WBC: 5.4 10*3/uL (ref 3.4–10.8)

## 2016-09-24 LAB — COMPREHENSIVE METABOLIC PANEL
ALBUMIN: 4.3 g/dL (ref 3.5–5.5)
ALT: 12 IU/L (ref 0–32)
AST: 17 IU/L (ref 0–40)
Albumin/Globulin Ratio: 1.8 (ref 1.2–2.2)
Alkaline Phosphatase: 73 IU/L (ref 39–117)
BUN / CREAT RATIO: 13 (ref 9–23)
BUN: 10 mg/dL (ref 6–24)
CHLORIDE: 104 mmol/L (ref 96–106)
CO2: 22 mmol/L (ref 18–29)
CREATININE: 0.77 mg/dL (ref 0.57–1.00)
Calcium: 9.5 mg/dL (ref 8.7–10.2)
GFR calc non Af Amer: 87 mL/min/{1.73_m2} (ref >59)
GFR, EST AFRICAN AMERICAN: 101 mL/min/{1.73_m2} (ref >59)
GLOBULIN, TOTAL: 2.4 g/dL (ref 1.5–4.5)
GLUCOSE: 89 mg/dL (ref 65–99)
Potassium: 4.4 mmol/L (ref 3.5–5.2)
Sodium: 144 mmol/L (ref 134–144)
TOTAL PROTEIN: 6.7 g/dL (ref 6.0–8.5)

## 2016-09-24 LAB — LIPID PANEL
Chol/HDL Ratio: 4.8 ratio units — ABNORMAL HIGH (ref 0.0–4.4)
Cholesterol, Total: 257 mg/dL — ABNORMAL HIGH (ref 100–199)
HDL: 54 mg/dL (ref >39)
LDL CALC: 186 mg/dL — AB (ref 0–99)
Triglycerides: 87 mg/dL (ref 0–149)
VLDL CHOLESTEROL CAL: 17 mg/dL (ref 5–40)

## 2016-09-24 LAB — TSH: TSH: 3.13 u[IU]/mL (ref 0.450–4.500)

## 2016-09-24 MED ORDER — PROMETHAZINE HCL 12.5 MG PO TABS: 12.5000 mg | ORAL_TABLET | Freq: Three times a day (TID) | ORAL | 0 refills | Status: DC | PRN

## 2016-09-29 ENCOUNTER — Other Ambulatory Visit: Payer: Self-pay | Admitting: Family Medicine

## 2016-09-29 MED ORDER — ROSUVASTATIN CALCIUM 5 MG PO TABS: 5.0000 mg | ORAL_TABLET | Freq: Every day | ORAL | 3 refills | Status: DC

## 2016-10-14 ENCOUNTER — Other Ambulatory Visit (INDEPENDENT_AMBULATORY_CARE_PROVIDER_SITE_OTHER): Payer: Managed Care, Other (non HMO)

## 2016-10-14 DIAGNOSIS — Z1211 Encounter for screening for malignant neoplasm of colon: Secondary | ICD-10-CM

## 2016-10-14 LAB — FECAL OCCULT BLOOD, IMMUNOCHEMICAL: Fecal Occult Bld: NEGATIVE

## 2016-10-15 ENCOUNTER — Encounter: Payer: Self-pay | Admitting: *Deleted

## 2016-11-03 ENCOUNTER — Other Ambulatory Visit: Payer: Self-pay | Admitting: Family Medicine

## 2016-11-03 DIAGNOSIS — M5010 Cervical disc disorder with radiculopathy, unspecified cervical region: Secondary | ICD-10-CM

## 2016-11-10 ENCOUNTER — Ambulatory Visit (INDEPENDENT_AMBULATORY_CARE_PROVIDER_SITE_OTHER): Payer: Self-pay

## 2016-11-10 ENCOUNTER — Ambulatory Visit (INDEPENDENT_AMBULATORY_CARE_PROVIDER_SITE_OTHER): Payer: Managed Care, Other (non HMO) | Admitting: Physical Medicine and Rehabilitation

## 2016-11-10 ENCOUNTER — Encounter (INDEPENDENT_AMBULATORY_CARE_PROVIDER_SITE_OTHER): Payer: Self-pay | Admitting: Physical Medicine and Rehabilitation

## 2016-11-10 VITALS — BP 130/81 | HR 80

## 2016-11-10 DIAGNOSIS — M5412 Radiculopathy, cervical region: Secondary | ICD-10-CM

## 2016-11-10 MED ORDER — LIDOCAINE HCL (PF) 1 % IJ SOLN
0.3300 mL | Freq: Once | INTRAMUSCULAR | Status: AC
  Administered 2016-11-10: 0.3 mL

## 2016-11-10 MED ORDER — METHYLPREDNISOLONE ACETATE 80 MG/ML IJ SUSP
80.0000 mg | Freq: Once | INTRAMUSCULAR | Status: AC
  Administered 2016-11-10: 80 mg

## 2016-11-10 NOTE — Patient Instructions (Signed)

## 2016-11-10 NOTE — Progress Notes (Signed)
VIANCA GARDENHIRE - 56 y.o. female MRN [id number]  Date of birth: October 05, 1961  Office Visit Note: Visit Date: 11/10/2016 PCP: Arnette Norris, MD Referred by: Lucille Passy, MD  Subjective: Chief Complaint  Patient presents with  . Neck - Pain   HPI: [name] is a pleasant 56 year old female that we've seen in the past for her lower back and who normally sees Dr. Ninfa Linden in our office. He is here today for neck pain and planned C7-T1 intralaminar epidural steroid junction at the request of Dr. Sherley Bounds at [facility] neurosurgery.  she reports Left side worse. Tingling down left arm to fingers. Elbow hurts at times. Headaches. Has done dry needling in physical therapy which has helped some. Denies any weakness.  Her history is such that Dr. Ninfa Linden did obtain an MRI of her cervical spine at looking at the pathology involved did send her to Dr. Ronnald Ramp for evaluation and management.  She reports that he did offer surgical options but she did not know if she wanted to do that quite yet so he suggested epidural injection.    ROS Otherwise per HPI.  Assessment & Plan: Visit Diagnoses:  1. Cervical radiculopathy     Plan: Findings:  C7-T1 interlaminar epidural steroid injection. To be diagnostic and hopefully therapeutic.    Meds & Orders:  Meds ordered this encounter  Medications  . lidocaine (PF) (XYLOCAINE) 1 % injection 0.3 mL  . methylPREDNISolone acetate (DEPO-MEDROL) injection 80 mg    Orders Placed This Encounter  Procedures  . XR C-ARM NO REPORT  . Epidural Steroid injection    Follow-up: Return if symptoms worsen or fail to improve after 2 weeks, for Appointment with Dr. Ronnald Ramp.   Procedures: No procedures performed  Cervical Epidural Steroid Injection - Interlaminar Approach with Fluoroscopic Guidance  Patient: [name] [name]      Date of Birth: 1960-11-15 MRN: [id number] PCP: Arnette Norris, MD      Visit Date: 11/10/2016   Universal Protocol:    Date/Time:  01/31/189:01 AM  Consent Given By: the patient  Position: PRONE  Additional Comments: Vital signs were monitored before and after the procedure. Patient was prepped and draped in the usual sterile fashion. The correct patient, procedure, and site was verified.   Injection Procedure Details:  Procedure Site One Meds Administered:  Meds ordered this encounter  Medications  . lidocaine (PF) (XYLOCAINE) 1 % injection 0.3 mL  . methylPREDNISolone acetate (DEPO-MEDROL) injection 80 mg     Laterality: Left  Location/Site:  C7-T1  Needle size: 20 G  Needle type: Touhy  Needle Placement: Paramedian epidural space  Findings:  -Contrast Used: 1 mL iohexol 180 mg iodine/mL   -Comments: Excellent flow of contrast into the epidural space.  Procedure Details: Using a paramedian approach from the side mentioned above, the region overlying the inferior lamina was localized under fluoroscopic visualization and the soft tissues overlying this structure were infiltrated with 4 ml. of 1% Lidocaine without Epinephrine. A # 20 gauge, Tuohy needle was inserted into the epidural space using a paramedian approach.  The epidural space was localized using loss of resistance along with lateral and contralateral oblique bi-planar fluoroscopic views.  After negative aspirate for air, blood, and CSF, a 2 ml. volume of Isovue-250 was injected into the epidural space and the flow of contrast was observed. Radiographs were obtained for documentation purposes.   The injectate was administered into the level noted above.  Additional Comments:  The patient tolerated the  procedure well Dressing: Band-Aid    Post-procedure details: Patient was observed during the procedure. Post-procedure instructions were reviewed.  Patient left the clinic in stable condition.     Clinical History: MRI dated 09/18/2016  C2-C3: Subtle central disc protrusion (series 5, image 4). No stenosis.  C3-C4: Mild to  moderate left and mild right facet hypertrophy. Mild left uncovertebral hypertrophy. Mild left C4 foraminal stenosis.  C4-C5: Mild facet hypertrophy. Minimal to mild disc bulge and facet hypertrophy. No stenosis.  C5-C6:  Negative.  C6-C7: Disc space loss. Circumferential disc bulge eccentric to the left. Endplate spurring and uncovertebral hypertrophy greater on the left. Minimal to mild facet hypertrophy. Mild ligament flavum hypertrophy. Mild spinal stenosis without spinal cord mass effect (series 2, image 6). Disc material appears to extend into the left C7 neural foramen, but not as much on the right. Moderate to severe left C7 foraminal stenosis. Mild right foraminal stenosis.  C7-T1:  Negative.  No upper thoracic spinal or foraminal stenosis.  She reports that she has been smoking Cigarettes.  She has a 42.00 pack-year smoking history. She has never used smokeless tobacco. No results for input(s): HGBA1C, LABURIC in the last 8760 hours.  Objective:  VS:  HT:    WT:   BMI:     BP:130/81  HR:80bpm  TEMP: ( )  RESP:98 % Physical Exam  Musculoskeletal:  Recall range of motion limited in ranges worse with extension. pacific trigger points noted no shoulder impingement good strength in the upper extremities bilaterally.    Ortho Exam Imaging: Xr C-arm No Report  Result Date: 11/10/2016 Please see Notes or Procedures tab for imaging impression.   Past Medical/Family/Surgical/Social History: Medications & Allergies reviewed per EMR Patient Active Problem List   Diagnosis Date Noted  . Well woman exam 09/22/2016  . Cervical disc disease 09/22/2016  . Osteoarthritis of right hip 12/27/2015  . Status post total replacement of right hip 12/27/2015  . S/P total hip arthroplasty 12/27/2015  . Smokers' cough ([place]) 08/27/2014  . Depression 08/27/2014  . Vitiligo 04/12/2012  . Surgical menopause 06/16/2011  . MAMMOGRAM, ABNORMAL, LEFT 02/17/2010  . HLD (hyperlipidemia)  02/06/2010  . TOBACCO ABUSE 02/06/2010  . Anxiety and depression 02/06/2010  . OSTEOARTHRITIS 02/06/2010   Past Medical History:  Diagnosis Date  . Anxiety   . Anxiety disorder   . Broken jaw ([place])   . COPD (chronic obstructive pulmonary disease) ([place])   . Depression   . Fibroids    Uterine  . HLD (hyperlipidemia)   . OA (osteoarthritis)   . Tobacco abuse   . Vitiligo 1975  . Wears glasses    Family History  Problem Relation Age of Onset  . Depression Mother   . Cancer Father   . Depression Father   . Miscarriages / Stillbirths Sister    Past Surgical History:  Procedure Laterality Date  . ABDOMINAL HYSTERECTOMY  2011  . DILATION AND CURETTAGE OF UTERUS    . HIP ARTHROSCOPY  2003   right   . MANDIBLE SURGERY  1980  . TOTAL HIP ARTHROPLASTY Right 12/27/2015   Procedure: RIGHT TOTAL HIP ARTHROPLASTY ANTERIOR APPROACH;  Surgeon: Mcarthur Rossetti, MD;  Location: [facility];  Service: Orthopedics;  Laterality: Right;  . TUBAL LIGATION    . 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 Smoking status: Current Every Day Smoker    Packs/day: 1.00  Years: 42.00    Types: Cigarettes  . Smokeless tobacco: Never Used  . Alcohol use No     Comment: history of alcoholism quit 16 years ago   . Drug use: Yes     Comment: Marijuana (uses daily)  . Sexual activity: Not Currently

## 2016-11-11 ENCOUNTER — Other Ambulatory Visit: Payer: Self-pay

## 2016-11-11 DIAGNOSIS — M5010 Cervical disc disorder with radiculopathy, unspecified cervical region: Secondary | ICD-10-CM

## 2016-11-11 MED ORDER — MELOXICAM 15 MG PO TABS: 15.0000 mg | ORAL_TABLET | Freq: Every day | ORAL | 1 refills | Status: DC | PRN

## 2016-11-11 NOTE — Procedures (Signed)
Cervical Epidural Steroid Injection - Interlaminar Approach with Fluoroscopic Guidance  Patient: [name] [name]      Date of Birth: Oct 06, 1961 MRN: [id number] PCP: Arnette Norris, MD      Visit Date: 11/10/2016   Universal Protocol:    Date/Time: 01/31/189:01 AM  Consent Given By: the patient  Position: PRONE  Additional Comments: Vital signs were monitored before and after the procedure. Patient was prepped and draped in the usual sterile fashion. The correct patient, procedure, and site was verified.   Injection Procedure Details:  Procedure Site One Meds Administered:  Meds ordered this encounter  Medications  . lidocaine (PF) (XYLOCAINE) 1 % injection 0.3 mL  . methylPREDNISolone acetate (DEPO-MEDROL) injection 80 mg     Laterality: Left  Location/Site:  C7-T1  Needle size: 20 G  Needle type: Touhy  Needle Placement: Paramedian epidural space  Findings:  -Contrast Used: 1 mL iohexol 180 mg iodine/mL   -Comments: Excellent flow of contrast into the epidural space.  Procedure Details: Using a paramedian approach from the side mentioned above, the region overlying the inferior lamina was localized under fluoroscopic visualization and the soft tissues overlying this structure were infiltrated with 4 ml. of 1% Lidocaine without Epinephrine. A # 20 gauge, Tuohy needle was inserted into the epidural space using a paramedian approach.  The epidural space was localized using loss of resistance along with lateral and contralateral oblique bi-planar fluoroscopic views.  After negative aspirate for air, blood, and CSF, a 2 ml. volume of Isovue-250 was injected into the epidural space and the flow of contrast was observed. Radiographs were obtained for documentation purposes.   The injectate was administered into the level noted above.  Additional Comments:  The patient tolerated the procedure well Dressing: Band-Aid    Post-procedure details: Patient was observed during  the procedure. Post-procedure instructions were reviewed.  Patient left the clinic in stable condition.

## 2016-11-11 NOTE — Telephone Encounter (Signed)
Pharmacy faxed a refill request on patient's meloxicam to Tor Netters, NP who is no longer here at [facility].  Tor Netters originally saw and prescribed in November.    As Dr. Deborra Medina is patient's PCP, will defer to her advice whether patient should continue and r/x be filled.  It appears patient is also being seen by orthopedics for cervical disc disorder.    Please advise if okay to fill.

## 2016-12-16 ENCOUNTER — Ambulatory Visit (INDEPENDENT_AMBULATORY_CARE_PROVIDER_SITE_OTHER): Payer: Managed Care, Other (non HMO) | Admitting: Physician Assistant

## 2016-12-16 DIAGNOSIS — G8929 Other chronic pain: Secondary | ICD-10-CM | POA: Diagnosis not present

## 2016-12-16 DIAGNOSIS — M542 Cervicalgia: Secondary | ICD-10-CM

## 2016-12-16 DIAGNOSIS — M533 Sacrococcygeal disorders, not elsewhere classified: Secondary | ICD-10-CM | POA: Diagnosis not present

## 2016-12-16 DIAGNOSIS — M5441 Lumbago with sciatica, right side: Secondary | ICD-10-CM | POA: Diagnosis not present

## 2016-12-16 MED ORDER — DICLOFENAC SODIUM 75 MG PO TBEC: 75.0000 mg | DELAYED_RELEASE_TABLET | Freq: Two times a day (BID) | ORAL | 1 refills | Status: DC

## 2016-12-16 NOTE — Progress Notes (Signed)
Office Visit Note   Patient: [name] [name]           Date of Birth: Mar 15, 1961           MRN: [id number] Visit Date: 12/16/2016              Requested by: Lucille Passy, MD [place], [place] [id number] PCP: Arnette Norris, MD   Assessment & Plan: Visit Diagnoses:  1. Chronic right-sided low back pain with right-sided sciatica   2. Chronic right SI joint pain   3. Cervicalgia     Plan:We will place her back on diclofenac she is taking no other NSAIDs while on this. We'll send her for a SI joint injection on the right is his main complaint today. She is to keep the right Gen. she does after the injection. Also neurovascular to take note of the numbness tingling she has in the left hand.  Follow-Up Instructions: Return in about 4 weeks (around 01/13/2017).   Orders:  No orders of the defined types were placed in this encounter.  Meds ordered this encounter  Medications  . diclofenac (VOLTAREN) 75 MG EC tablet    Sig: Take 1 tablet (75 mg total) by mouth 2 (two) times daily.    Dispense:  60 tablet    Refill:  1      Procedures: No procedures performed   Clinical Data: No additional findings.   Subjective: No chief complaint on file.   HPI [name] returns today due to back and neck pain. She states that her most significant pain is in her lower back she points over the SI joint Wehner stenosis. She has some radicular symptoms symptoms down the right leg occasionally but would keeps her with constant pain is the lower lumbar and SI joint region. She states pain is 8 out of 10 pain worse sometimes it just slows 1-3 out of 10 pain. States the pain in the region of the SI joint is constant. Epidural steroid injections in her  lumbar region by Dr. Romona Curls states this did not help at all. Her sternum neck she had an MRI which shows significant degenerative disc disease at C6-C7 with this material into the foramina on the left side. Her neck pain goes down into her  left arm and causes some numbness into the hand times. Did see Dr. Ronnald Ramp [facility] and stated that he told her to return follow with Dr. Ernestina Patches for her neck. She underwent a cervical epidural steroid injection interlaminar approach that she states gave her no relief. The most relief from dry needling in regards to her back and neck. She's question if she should go back on an NSAID as she's been on Celebrex and diclofenac in the past and these help some. Review of Systems See HPI  Objective: Vital Signs: There were no vitals taken for this visit.  Physical Exam  Ortho Exam 5 out of 5 strength throughout the upper extremities against resistance. Tenderness medial border left scapula. Hands that she has full sensation motor. Radial pulse 2+ equal and symmetric bilaterally. Lower extremities she has a positive straight leg raise on the right negative on the left she has tenderness over the right SI joint. Positive Faber's maneuver on the right negative on the left. Specialty Comments:  No specialty comments available.  Imaging: No results found.   PMFS History: Patient Active Problem List   Diagnosis Date Noted  . Well woman exam 09/22/2016  .  Cervical disc disease 09/22/2016  . Osteoarthritis of right hip 12/27/2015  . Status post total replacement of right hip 12/27/2015  . S/P total hip arthroplasty 12/27/2015  . Smokers' cough ([place]) 08/27/2014  . Depression 08/27/2014  . Vitiligo 04/12/2012  . Surgical menopause 06/16/2011  . MAMMOGRAM, ABNORMAL, LEFT 02/17/2010  . HLD (hyperlipidemia) 02/06/2010  . TOBACCO ABUSE 02/06/2010  . Anxiety and depression 02/06/2010  . OSTEOARTHRITIS 02/06/2010   Past Medical History:  Diagnosis Date  . Anxiety   . Anxiety disorder   . Broken jaw ([place])   . COPD (chronic obstructive pulmonary disease) ([place])   . Depression   . Fibroids    Uterine  . HLD (hyperlipidemia)   . OA (osteoarthritis)   . Tobacco abuse   .  Vitiligo 1975  . Wears glasses     Family History  Problem Relation Age of Onset  . Depression Mother   . Cancer Father   . Depression Father   . Miscarriages / Stillbirths Sister     Past Surgical History:  Procedure Laterality Date  . ABDOMINAL HYSTERECTOMY  2011  . DILATION AND CURETTAGE OF UTERUS    . HIP ARTHROSCOPY  2003   right   . MANDIBLE SURGERY  1980  . TOTAL HIP ARTHROPLASTY Right 12/27/2015   Procedure: RIGHT TOTAL HIP ARTHROPLASTY ANTERIOR APPROACH;  Surgeon: Mcarthur Rossetti, MD;  Location: [facility];  Service: Orthopedics;  Laterality: Right;  . TUBAL LIGATION    . 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 Smoking status: Current Every Day Smoker    Packs/day: 1.00    Years: 42.00    Types: Cigarettes  . Smokeless tobacco: Never Used  . Alcohol use No     Comment: history of alcoholism quit 16 years ago   . Drug use: Yes     Comment: Marijuana (uses daily)  . Sexual activity: Not Currently

## 2017-01-04 ENCOUNTER — Telehealth (INDEPENDENT_AMBULATORY_CARE_PROVIDER_SITE_OTHER): Payer: Self-pay | Admitting: Physical Medicine and Rehabilitation

## 2017-01-04 NOTE — Telephone Encounter (Signed)
She needs to follow-up again with Benita Stabile. According to his last note which he can read the cervical epidural injection gave her no relief. Prior epidural injections of the lumbar spine given her no relief. He wanted to have a sacroiliac joint injection which her insurance company denied.

## 2017-01-06 NOTE — Telephone Encounter (Signed)
Left message for pt to call back to discuss

## 2017-01-06 NOTE — Telephone Encounter (Signed)
Scheduled for OV with Artis Delay

## 2017-01-13 ENCOUNTER — Ambulatory Visit (INDEPENDENT_AMBULATORY_CARE_PROVIDER_SITE_OTHER): Payer: Managed Care, Other (non HMO) | Admitting: Physician Assistant

## 2017-01-13 DIAGNOSIS — M542 Cervicalgia: Secondary | ICD-10-CM | POA: Diagnosis not present

## 2017-01-13 DIAGNOSIS — G8929 Other chronic pain: Secondary | ICD-10-CM

## 2017-01-13 DIAGNOSIS — M545 Low back pain: Secondary | ICD-10-CM

## 2017-01-13 DIAGNOSIS — M533 Sacrococcygeal disorders, not elsewhere classified: Secondary | ICD-10-CM

## 2017-01-13 MED ORDER — CYCLOBENZAPRINE HCL 10 MG PO TABS: 10.0000 mg | ORAL_TABLET | Freq: Three times a day (TID) | ORAL | 1 refills | Status: DC

## 2017-01-13 NOTE — Progress Notes (Signed)
Office Visit Note   Patient: [name] [name]           Date of Birth: [birth year]           MRN: [id number] Visit Date: 01/13/2017              Requested by: Lucille Passy, MD [place], [place] [id number] PCP: Arnette Norris, MD   Assessment & Plan: Visit Diagnoses:  1. Cervicalgia   2. Chronic bilateral low back pain, with sciatica presence unspecified   3. Chronic right SI joint pain     Plan: Due to patient's lack of improvement with conservative treatment which is included physical therapy time epidural steroid injections anemia referral to neurosurgery would recommend that she be referred to pain management to help manage her low back pain and neck pain SI joint pain. She'll follow up with [place] on an as-needed basis.  Follow-Up Instructions: Return if symptoms worsen or fail to improve.   Orders:  No orders of the defined types were placed in this encounter.  Meds ordered this encounter  Medications  . cyclobenzaprine (FLEXERIL) 10 MG tablet    Sig: Take 1 tablet (10 mg total) by mouth 3 (three) times daily.    Dispense:  40 tablet    Refill:  1    Please consider 90 day supplies to promote better adherence      Procedures: No procedures performed   Clinical Data: No additional findings.   Subjective: Neck pain, low back pain, and right buttocks pain  HPI [name] returns today follow-up of her low back pain and neck pain and low buttocks pain. She states that her pain waxes and wanes says some days that she has no neck pain and has back pain . She may have neck pain and no back pain or no buttocks pain. Again she underwent a right total hip arthroplasty 3 /2017. Right hip overall is doing well. She states she still has numbness tingling bilateral feet now that she "thinks about it" she was having this prior to surgery. She's having no real radicular symptoms down the legs at this point in time but occasionally has radicular symptoms down the right leg. Had  conservative treatment including physical therapy and epidural steroid injections medications and actually solve neurosurgery for her neck and back. She states she is not interested in a neck or back surgery. She was denied SI joint injection. Review of Systems   Objective: Vital Signs: There were no vitals taken for this visit.  Physical Exam Well-developed well-nourished female in no acute distress Ortho Exam Positive Saralyn Pilar test right SI joint good range of motion of the right hip without pain. She ambulates without any assistive devices. Specialty Comments:  No specialty comments available.  Imaging: No results found.   PMFS History: Patient Active Problem List   Diagnosis Date Noted  . Well woman exam 09/22/2016  . Cervical disc disease 09/22/2016  . Osteoarthritis of right hip 12/27/2015  . Status post total replacement of right hip 12/27/2015  . S/P total hip arthroplasty 12/27/2015  . Smokers' cough ([place]) 08/27/2014  . Depression 08/27/2014  . Vitiligo 04/12/2012  . Surgical menopause 06/16/2011  . MAMMOGRAM, ABNORMAL, LEFT 02/17/2010  . HLD (hyperlipidemia) 02/06/2010  . TOBACCO ABUSE 02/06/2010  . Anxiety and depression 02/06/2010  . OSTEOARTHRITIS 02/06/2010   Past Medical History:  Diagnosis Date  . Anxiety   . Anxiety disorder   . Broken jaw ([place])   .  COPD (chronic obstructive pulmonary disease) ([place])   . Depression   . Fibroids    Uterine  . HLD (hyperlipidemia)   . OA (osteoarthritis)   . Tobacco abuse   . Vitiligo 1975  . Wears glasses     Family History  Problem Relation Age of Onset  . Depression Mother   . Cancer Father   . Depression Father   . Miscarriages / Stillbirths Sister     Past Surgical History:  Procedure Laterality Date  . ABDOMINAL HYSTERECTOMY  2011  . DILATION AND CURETTAGE OF UTERUS    . HIP ARTHROSCOPY  2003   right   . MANDIBLE SURGERY  1980  . TOTAL HIP ARTHROPLASTY Right 12/27/2015   Procedure: RIGHT TOTAL HIP  ARTHROPLASTY ANTERIOR APPROACH;  Surgeon: Mcarthur Rossetti, MD;  Location: [facility];  Service: Orthopedics;  Laterality: Right;  . TUBAL LIGATION    . 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 Smoking status: Current Every Day Smoker    Packs/day: 1.00    Years: 42.00    Types: Cigarettes  . Smokeless tobacco: Never Used  . Alcohol use No     Comment: history of alcoholism quit 16 years ago   . Drug use: Yes     Comment: Marijuana (uses daily)  . Sexual activity: Not Currently

## 2017-01-14 ENCOUNTER — Other Ambulatory Visit (INDEPENDENT_AMBULATORY_CARE_PROVIDER_SITE_OTHER): Payer: Self-pay

## 2017-01-14 DIAGNOSIS — M5442 Lumbago with sciatica, left side: Principal | ICD-10-CM

## 2017-01-14 DIAGNOSIS — M542 Cervicalgia: Secondary | ICD-10-CM

## 2017-01-14 DIAGNOSIS — G8929 Other chronic pain: Secondary | ICD-10-CM

## 2017-04-06 ENCOUNTER — Other Ambulatory Visit: Payer: Self-pay | Admitting: Family Medicine

## 2017-04-06 DIAGNOSIS — F329 Major depressive disorder, single episode, unspecified: Secondary | ICD-10-CM

## 2017-04-06 DIAGNOSIS — F419 Anxiety disorder, unspecified: Principal | ICD-10-CM

## 2017-04-06 DIAGNOSIS — F32A Depression, unspecified: Secondary | ICD-10-CM

## 2017-04-07 NOTE — Telephone Encounter (Signed)
CALLED IN TO [facility] [address], [place]: [phone]

## 2017-04-07 NOTE — Telephone Encounter (Signed)
Last refill 09/02/16 Last OV 09/22/16 Ok to refill?

## 2017-06-30 ENCOUNTER — Telehealth: Payer: Self-pay | Admitting: *Deleted

## 2017-06-30 ENCOUNTER — Encounter: Payer: Self-pay | Admitting: *Deleted

## 2017-06-30 DIAGNOSIS — Z780 Asymptomatic menopausal state: Secondary | ICD-10-CM

## 2017-06-30 MED ORDER — ESTRADIOL 0.075 MG/24HR TD PTTW: 1.0000 | MEDICATED_PATCH | TRANSDERMAL | 0 refills | Status: DC

## 2017-06-30 NOTE — Telephone Encounter (Signed)
-----   Message from Blanchie Dessert, [place] sent at 06/30/2017  2:00 PM EDT ----- Regarding: Refill on Rx Contact: [phone] Please call refill for hormone patch to  [facility] [address]

## 2017-07-19 ENCOUNTER — Other Ambulatory Visit: Payer: Self-pay | Admitting: Internal Medicine

## 2017-07-19 ENCOUNTER — Ambulatory Visit (INDEPENDENT_AMBULATORY_CARE_PROVIDER_SITE_OTHER): Payer: Managed Care, Other (non HMO) | Admitting: Internal Medicine

## 2017-07-19 ENCOUNTER — Encounter: Payer: Self-pay | Admitting: Internal Medicine

## 2017-07-19 VITALS — BP 126/80 | HR 62 | Temp 97.9°F | Wt 114.5 lb

## 2017-07-19 DIAGNOSIS — F419 Anxiety disorder, unspecified: Secondary | ICD-10-CM

## 2017-07-19 DIAGNOSIS — Z23 Encounter for immunization: Secondary | ICD-10-CM | POA: Diagnosis not present

## 2017-07-19 DIAGNOSIS — J449 Chronic obstructive pulmonary disease, unspecified: Secondary | ICD-10-CM | POA: Insufficient documentation

## 2017-07-19 DIAGNOSIS — L80 Vitiligo: Secondary | ICD-10-CM

## 2017-07-19 DIAGNOSIS — M150 Primary generalized (osteo)arthritis: Secondary | ICD-10-CM

## 2017-07-19 DIAGNOSIS — F32A Depression, unspecified: Secondary | ICD-10-CM

## 2017-07-19 DIAGNOSIS — F329 Major depressive disorder, single episode, unspecified: Secondary | ICD-10-CM

## 2017-07-19 DIAGNOSIS — M8949 Other hypertrophic osteoarthropathy, multiple sites: Secondary | ICD-10-CM

## 2017-07-19 DIAGNOSIS — E7800 Pure hypercholesterolemia, unspecified: Secondary | ICD-10-CM | POA: Diagnosis not present

## 2017-07-19 DIAGNOSIS — M159 Polyosteoarthritis, unspecified: Secondary | ICD-10-CM

## 2017-07-19 LAB — LIPID PANEL
CHOL/HDL RATIO: 5
Cholesterol: 218 mg/dL — ABNORMAL HIGH (ref 0–200)
HDL: 45.1 mg/dL (ref >39.00)
LDL CALC: 153 mg/dL — AB (ref 0–99)
NONHDL: 172.67
TRIGLYCERIDES: 99 mg/dL (ref 0.0–149.0)
VLDL: 19.8 mg/dL (ref 0.0–40.0)

## 2017-07-19 LAB — COMPREHENSIVE METABOLIC PANEL
ALT: 10 U/L (ref 0–35)
AST: 11 U/L (ref 0–37)
Albumin: 4.3 g/dL (ref 3.5–5.2)
Alkaline Phosphatase: 66 U/L (ref 39–117)
BUN: 9 mg/dL (ref 6–23)
CO2: 32 meq/L (ref 19–32)
CREATININE: 0.93 mg/dL (ref 0.40–1.20)
Calcium: 10 mg/dL (ref 8.4–10.5)
Chloride: 107 mEq/L (ref 96–112)
GFR: 66.26 mL/min (ref >60.00)
GLUCOSE: 95 mg/dL (ref 70–99)
Potassium: 5.7 mEq/L — ABNORMAL HIGH (ref 3.5–5.1)
SODIUM: 142 meq/L (ref 135–145)
Total Bilirubin: 0.4 mg/dL (ref 0.2–1.2)
Total Protein: 6.8 g/dL (ref 6.0–8.3)

## 2017-07-19 MED ORDER — SODIUM POLYSTYRENE SULFONATE PO POWD: Freq: Once | ORAL | 0 refills | Status: AC

## 2017-07-19 NOTE — Assessment & Plan Note (Signed)
She will continue Voltaren Advised her I will not fill Flexeril, Ultram and Hydrocodone on a routine basis for her Encouraged her to stay active

## 2017-07-19 NOTE — Patient Instructions (Signed)
Fat and Cholesterol Restricted Diet Getting too much fat and cholesterol in your diet may cause health problems. Following this diet helps keep your fat and cholesterol at normal levels. This can keep you from getting sick. What types of fat should I choose?  Choose monosaturated and polyunsaturated fats. These are found in foods such as olive oil, canola oil, flaxseeds, walnuts, almonds, and seeds.  Eat more omega-3 fats. Good choices include salmon, mackerel, sardines, tuna, flaxseed oil, and ground flaxseeds.  Limit saturated fats. These are in animal products such as meats, butter, and cream. They can also be in plant products such as palm oil, palm kernel oil, and coconut oil.  Avoid foods with partially hydrogenated oils in them. These contain trans fats. Examples of foods that have trans fats are stick margarine, some tub margarines, cookies, crackers, and other baked goods. What general guidelines do I need to follow?  Check food labels. Look for the words "trans fat" and "saturated fat."  When preparing a meal: ? Fill half of your plate with vegetables and green salads. ? Fill one fourth of your plate with whole grains. Look for the word "whole" as the first word in the ingredient list. ? Fill one fourth of your plate with lean protein foods.  Eat more foods that have fiber, like apples, carrots, beans, peas, and barley.  Eat more home-cooked foods. Eat less at restaurants and buffets.  Limit or avoid alcohol.  Limit foods high in starch and sugar.  Limit fried foods.  Cook foods without frying them. Baking, boiling, grilling, and broiling are all great options.  Lose weight if you are overweight. Losing even a small amount of weight can help your overall health. It can also help prevent diseases such as diabetes and heart disease. What foods can I eat? Grains Whole grains, such as whole wheat or whole grain breads, crackers, cereals, and pasta. Unsweetened oatmeal,  bulgur, barley, quinoa, or brown rice. Corn or whole wheat flour tortillas. Vegetables Fresh or frozen vegetables (raw, steamed, roasted, or grilled). Green salads. Fruits All fresh, canned (in natural juice), or frozen fruits. Meat and Other Protein Products Ground beef (85% or leaner), grass-fed beef, or beef trimmed of fat. Skinless chicken or turkey. Ground chicken or turkey. Pork trimmed of fat. All fish and seafood. Eggs. Dried beans, peas, or lentils. Unsalted nuts or seeds. Unsalted canned or dry beans. Dairy Low-fat dairy products, such as skim or 1% milk, 2% or reduced-fat cheeses, low-fat ricotta or cottage cheese, or plain low-fat yogurt. Fats and Oils Tub margarines without trans fats. Light or reduced-fat mayonnaise and salad dressings. Avocado. Olive, canola, sesame, or safflower oils. Natural peanut or almond butter (choose ones without added sugar and oil). The items listed above may not be a complete list of recommended foods or beverages. Contact your dietitian for more options. What foods are not recommended? Grains White bread. White pasta. White rice. Cornbread. Bagels, pastries, and croissants. Crackers that contain trans fat. Vegetables White potatoes. Corn. Creamed or fried vegetables. Vegetables in a cheese sauce. Fruits Dried fruits. Canned fruit in light or heavy syrup. Fruit juice. Meat and Other Protein Products Fatty cuts of meat. Ribs, chicken wings, bacon, sausage, bologna, salami, chitterlings, fatback, hot dogs, bratwurst, and packaged luncheon meats. Liver and organ meats. Dairy Whole or 2% milk, cream, half-and-half, and cream cheese. Whole milk cheeses. Whole-fat or sweetened yogurt. Full-fat cheeses. Nondairy creamers and whipped toppings. Processed cheese, cheese spreads, or cheese curds. Sweets and Desserts Corn   syrup, sugars, honey, and molasses. Candy. Jam and jelly. Syrup. Sweetened cereals. Cookies, pies, cakes, donuts, muffins, and ice  cream. Fats and Oils Butter, stick margarine, lard, shortening, ghee, or bacon fat. Coconut, palm kernel, or palm oils. Beverages Alcohol. Sweetened drinks (such as sodas, lemonade, and fruit drinks or punches). The items listed above may not be a complete list of foods and beverages to avoid. Contact your dietitian for more information. This information is not intended to replace advice given to you by your health care provider. Make sure you discuss any questions you have with your health care provider. Document Released: 03/29/2012 Document Revised: 06/04/2016 Document Reviewed: 12/28/2013 Elsevier Interactive Patient Education  2018 Elsevier Inc.  

## 2017-07-19 NOTE — Assessment & Plan Note (Signed)
Encouraged smoking cessation, she declines at this time Consider Pulmicort if she continues to have chronic cough and SOB

## 2017-07-19 NOTE — Addendum Note (Signed)
Addended by: Emelia Salisbury C on: 07/19/2017 12:00 PM   Modules accepted: Orders

## 2017-07-19 NOTE — Assessment & Plan Note (Signed)
CMET and Lipid profile today Encouraged her to consume a low fat diet Will adjust Crestor as needed based on labs

## 2017-07-19 NOTE — Assessment & Plan Note (Signed)
Chronic Continue Wellbutrin Advised her I would not refill Ativan, so I can either send her to psych for management or we can wean her off She wants to think about this and get back with me

## 2017-07-19 NOTE — Assessment & Plan Note (Signed)
Chronic No treatment  Will monitor

## 2017-07-19 NOTE — Progress Notes (Signed)
HPI  Pt presents to the clinic today to establish care and for management of the conditions listed below. She is transferring care from Dr. Deborra Medina.  Anxiety and Depression: She is not sure what triggers this. She feels like this is well controlled on Wellbutrin and Lorazepam. She takes the Lorazepam 2-3 times per week.  COPD: She reports chronic cough but denies shortness of breath. She is not using any inhalers at this time. She does continue to smoke.  HLD: Her last LDL was 186, 09/2016. She is taking Rosuvastatin, but not everyday. She denies myalgias. She tries to consume a low fat diet.  OA: Mainly in her right hip s/p arthroplasty. She uses Voltaren, Flexeril, Ultram and Hydrocodone. She is out of the Voltaren and would like a refill today.  Vitiligo: She does not follow with a dermatologist.  Flu: 09/2016 Tetanus: 01/2010 Pap Smear: 01/2010, hysterectomy Mammogram: 02/2011 Colon Screening: never Vision Screening: as needed Dentist: as needed  Past Medical History:  Diagnosis Date  . Anxiety   . Anxiety disorder   . Broken jaw ([place])   . COPD (chronic obstructive pulmonary disease) ([place])   . Depression   . Fibroids    Uterine  . HLD (hyperlipidemia)   . OA (osteoarthritis)   . Tobacco abuse   . Vitiligo 1975  . Wears glasses     Current Outpatient Prescriptions  Medication Sig Dispense Refill  . buPROPion (WELLBUTRIN XL) 150 MG 24 hr tablet Take 1 tablet (150 mg total) by mouth daily. 90 tablet 6  . cyclobenzaprine (FLEXERIL) 10 MG tablet Take 1 tablet (10 mg total) by mouth 3 (three) times daily. 40 tablet 1  . diclofenac (VOLTAREN) 75 MG EC tablet Take 1 tablet (75 mg total) by mouth 2 (two) times daily. 60 tablet 1  . estradiol (VIVELLE-DOT) 0.075 MG/24HR Place 1 patch onto the skin 2 (two) times a week. 8 patch 0  . HYDROcodone-acetaminophen (NORCO/VICODIN) 5-325 MG tablet Take 1-2 tablets by mouth 2 (two) times daily as needed for moderate pain. 60 tablet 0  .  loratadine (CLARITIN) 10 MG tablet Take 10 mg by mouth daily as needed for allergies.    Marland Kitchen LORazepam (ATIVAN) 0.5 MG tablet TAKE ONE TABLET BY MOUTH EVERY 8 HOURS AS NEEDED 60 tablet 5  . meloxicam (MOBIC) 15 MG tablet Take 1 tablet (15 mg total) by mouth daily as needed for pain. 30 tablet 1  . promethazine (PHENERGAN) 12.5 MG tablet Take 1 tablet (12.5 mg total) by mouth every 8 (eight) hours as needed for nausea or vomiting. 30 tablet 0  . rosuvastatin (CRESTOR) 5 MG tablet Take 1 tablet (5 mg total) by mouth daily. 90 tablet 3  . traMADol (ULTRAM) 50 MG tablet Take by mouth every 6 (six) hours as needed.     No current facility-administered medications for this visit.     Allergies  Allergen Reactions  . Codeine Nausea Only    Pt states when given codeine Zofran will not work for the nausea     Family History  Problem Relation Age of Onset  . Depression Mother   . Cancer Father   . Depression Father   . Miscarriages / [place] Sister     Social History   Social History  . Marital status: Married    Spouse name: N/A  . Number of children: 3  . Years of education: N/A   Occupational History  . 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  Smoking status: Current Every Day Smoker    Packs/day: 1.00    Years: 42.00    Types: Cigarettes  . Smokeless tobacco: Never Used  . Alcohol use No     Comment: history of alcoholism quit 16 years ago   . Drug use: Yes     Comment: Marijuana (uses daily)  . Sexual activity: Not Currently   Other Topics Concern  . Not on file   Social History Narrative   Lives with husband in [place] 1 1/2 PPD x 38 years      3 Engineer, civil (consulting) at [employer]      Daily caffeine use: 6-8      No alcohol      Illicit drug use-marijuana    ROS:  Constitutional: Denies fever, malaise, fatigue, headache or abrupt weight changes.  HEENT: Denies eye pain, eye redness, ear pain, ringing in the ears, wax buildup,  runny nose, nasal congestion, bloody nose, or sore throat. Respiratory: Pt reports chronic cough. Denies difficulty breathing, shortness of breath, or sputum production.   Cardiovascular: Denies chest pain, chest tightness, palpitations or swelling in the hands or feet.  Gastrointestinal: Denies abdominal pain, bloating, constipation, diarrhea or blood in the stool.  GU: Denies frequency, urgency, pain with urination, blood in urine, odor or discharge. Musculoskeletal: Pt reports joint pain. Denies decrease in range of motion, difficulty with gait, muscle pain or joint swelling.  Skin: Denies redness, rashes, lesions or ulcercations.  Neurological: Denies dizziness, difficulty with memory, difficulty with speech or problems with balance and coordination.  Psych: Pt has history of anxiety and depression. Denies SI/HI.  No other specific complaints in a complete review of systems (except as listed in HPI above).  PE:  BP 126/80   Pulse 62   Temp 97.9 F (36.6 C) (Oral)   Wt 114 lb 8 oz (51.9 kg)   BMI 22.93 kg/m   Wt Readings from Last 3 Encounters:  09/22/16 113 lb 8 oz (51.5 kg)  09/02/16 113 lb (51.3 kg)  12/27/15 116 lb (52.6 kg)    General: Appears her stated age, in NAD. Cardiovascular: Normal rate and rhythm. Pulmonary/Chest: Normal effort and coarse vesicular breath sounds. No respiratory distress. No wheezes, rales or ronchi noted.  Musculoskeletal: Joint enlargement noted in fingers. No difficulty with gait.  Neurological: Alert and oriented.  Psychiatric: Mood and affect normal. Behavior is normal. Judgment and thought content normal.     BMET    Component Value Date/Time   NA 144 09/22/2016 0810   K 4.4 09/22/2016 0810   CL 104 09/22/2016 0810   CO2 22 09/22/2016 0810   GLUCOSE 89 09/22/2016 0810   GLUCOSE 83 09/02/2016 1520   BUN 10 09/22/2016 0810   CREATININE 0.77 09/22/2016 0810   CREATININE 1.22 (H) 09/02/2016 1520   CALCIUM 9.5 09/22/2016 0810    GFRNONAA 87 09/22/2016 0810   GFRAA 101 09/22/2016 0810    Lipid Panel     Component Value Date/Time   CHOL 257 (H) 09/22/2016 0810   TRIG 87 09/22/2016 0810   HDL 54 09/22/2016 0810   CHOLHDL 4.8 (H) 09/22/2016 0810   CHOLHDL 5 08/22/2013 1154   VLDL 26.4 08/22/2013 1154   LDLCALC 186 (H) 09/22/2016 0810    CBC    Component Value Date/Time   WBC 5.4 09/22/2016 0810   WBC 8.7 09/02/2016 1520   RBC 5.26 09/22/2016 0810   RBC 5.35 (  H) 09/02/2016 1520   HGB 16.0 (H) 09/22/2016 0810   HCT 48.1 (H) 09/22/2016 0810   PLT 198 09/22/2016 0810   MCV 91 09/22/2016 0810   MCH 30.4 09/22/2016 0810   MCH 31.0 09/02/2016 1520   MCHC 33.3 09/22/2016 0810   MCHC 34.5 09/02/2016 1520   RDW 13.6 09/22/2016 0810   LYMPHSABS 1.4 09/22/2016 0810   MONOABS 783 09/02/2016 1520   EOSABS 0.1 09/22/2016 0810   BASOSABS 0.1 09/22/2016 0810    Hgb A1C No results found for: HGBA1C   Assessment and Plan:

## 2017-07-20 ENCOUNTER — Encounter: Payer: Self-pay | Admitting: Primary Care

## 2017-07-20 ENCOUNTER — Telehealth: Payer: Self-pay | Admitting: Internal Medicine

## 2017-07-20 ENCOUNTER — Telehealth: Payer: Self-pay | Admitting: Family Medicine

## 2017-07-20 ENCOUNTER — Ambulatory Visit (INDEPENDENT_AMBULATORY_CARE_PROVIDER_SITE_OTHER): Payer: Managed Care, Other (non HMO) | Admitting: Primary Care

## 2017-07-20 VITALS — BP 126/78 | HR 72 | Temp 98.2°F | Ht 59.25 in | Wt 113.0 lb

## 2017-07-20 DIAGNOSIS — E875 Hyperkalemia: Secondary | ICD-10-CM | POA: Diagnosis not present

## 2017-07-20 MED ORDER — SODIUM POLYSTYRENE SULFONATE 15 GM/60ML PO SUSP: 15.0000 g | Freq: Once | ORAL | 0 refills | Status: AC

## 2017-07-20 NOTE — Telephone Encounter (Signed)
Patient Name: [name] [name]  DOB: 28-May-1961    Initial Comment Caller states she got a pneumonia shot yesterday and now has severe arm pain and can't lift her arm. States she also got the flu shot and now has a headache and runny nose.   Nurse Assessment  Nurse: Sherrell Puller, RN, Amy Date/Time Eilene Ghazi Time): 07/20/2017 10:20:44 AM  Confirm and document reason for call. If symptomatic, describe symptoms. ---Caller states she got the pneumonia shot in the right arm and the flu shot in the left arm yesterday. She says now she is having pain in the right arm and can barely lift the arm. She says she also has a headache and runny nose since yesterday. Says she doesn't have a thermometer to check her temperature.  Does the patient have any new or worsening symptoms? ---Yes  Will a triage be completed? ---Yes  Related visit to physician within the last 2 weeks? ---Yes  Does the PT have any chronic conditions? (i.e. diabetes, asthma, etc.) ---Yes  List chronic conditions. ---COPD, high cholesterol.  Is this a behavioral health or substance abuse call? ---No     Guidelines    Guideline Title Affirmed Question Affirmed Notes  Headache [1] SEVERE headache (e.g., excruciating) AND [2] not improved after 2 hours of pain medicine    Final Disposition User   See Physician within 4 Hours (or PCP triage) Sherrell Puller, RN, Amy    Referrals  REFERRED TO PCP OFFICE   Caller Disagree/Comply Comply  Caller Understands Yes  PreDisposition Go to ED

## 2017-07-20 NOTE — Telephone Encounter (Signed)
Pt has appt with Allie Bossier NP on 07/20/17 at 4:15.

## 2017-07-20 NOTE — Patient Instructions (Addendum)
Reschedule your lab appointment for Monday next week.   We will send in the medication to lower your potassium, I'll check with Rollene Fare first.  Start Ibuprofen 800 mg three times daily as needed for arm pain, headaches, body aches.   Your symptoms should improve over the next 3-4 days.  It was a pleasure to see you today!

## 2017-07-20 NOTE — Telephone Encounter (Signed)
Patient evaluated.  

## 2017-07-20 NOTE — Progress Notes (Signed)
Subjective:    Patient ID: [name] [name], female    DOB: 16-Apr-1961, 56 y.o.   MRN: [id number]  HPI  [name] [name] is a 56 year old female who presents today with a chief complaint of headache. She also reports runny nose, fatigue, body aches that began last night.   She called in to team health today reporting bilateral upper extremity pain and these other symptoms since she received pneumonia and influenza vaccinations to each arm several hours prior. Today her upper extremity pain has improved. She denies fevers, cough, shortness of breath, wheezing. She's not taken anything OTC for her symptoms.   She also received a phone call and was told that her potassium was too high. She was also told they were sending in a medication to her pharmacy which was never received. She is due for repeat labs Thursday and is wondering if she needs to schedule.   Review of Systems  Constitutional: Negative for fever.  HENT: Positive for rhinorrhea. Negative for congestion and sore throat.   Respiratory: Negative for cough and shortness of breath.   Cardiovascular: Negative for chest pain.  Musculoskeletal: Positive for arthralgias and myalgias.       Past Medical History:  Diagnosis Date  . Anxiety   . COPD (chronic obstructive pulmonary disease) ([place])   . Depression   . Fibroids    Uterine  . HLD (hyperlipidemia)   . OA (osteoarthritis)   . Tobacco abuse   . Vitiligo 1975  . Wears glasses      Social History   Social History  . Marital status: Married    Spouse name: N/A  . Number of children: 3  . Years of education: N/A   Occupational History  . 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 Smoking status: Current Every Day Smoker    Packs/day: 1.50    Years: 42.00    Types: Cigarettes  . Smokeless tobacco: Never Used  . Alcohol use No     Comment: history of alcoholism quit 16 years ago   . Drug use: Yes     Comment: Marijuana (uses daily)  . Sexual activity: Not  Currently   Other Topics Concern  . Not on file   Social History Narrative   Lives with husband in [place] 1 1/2 PPD x 38 years      3 Engineer, civil (consulting) at [employer]      Daily caffeine use: 6-8      No alcohol      Illicit drug use-marijuana    Past Surgical History:  Procedure Laterality Date  . ABDOMINAL HYSTERECTOMY  2011  . DILATION AND CURETTAGE OF UTERUS    . HIP ARTHROSCOPY  2003   right   . MANDIBLE SURGERY  1980  . TOTAL HIP ARTHROPLASTY Right 12/27/2015   Procedure: RIGHT TOTAL HIP ARTHROPLASTY ANTERIOR APPROACH;  Surgeon: Mcarthur Rossetti, MD;  Location: [facility];  Service: Orthopedics;  Laterality: Right;  . TUBAL LIGATION    . URETER SURGERY  2011    Family History  Problem Relation Age of Onset  . Depression Mother   . Cancer Father   . Depression Father   . Miscarriages / Stillbirths Sister     Allergies  Allergen Reactions  . Codeine Nausea Only    Pt states when given codeine Zofran will not work for the nausea     Current Outpatient  Prescriptions on File Prior to Visit  Medication Sig Dispense Refill  . buPROPion (WELLBUTRIN XL) 150 MG 24 hr tablet Take 1 tablet (150 mg total) by mouth daily. 90 tablet 6  . diclofenac (VOLTAREN) 75 MG EC tablet Take 1 tablet (75 mg total) by mouth 2 (two) times daily. 60 tablet 1  . estradiol (VIVELLE-DOT) 0.075 MG/24HR Place 1 patch onto the skin 2 (two) times a week. 8 patch 0  . loratadine (CLARITIN) 10 MG tablet Take 10 mg by mouth daily as needed for allergies.    Marland Kitchen LORazepam (ATIVAN) 0.5 MG tablet TAKE ONE TABLET BY MOUTH EVERY 8 HOURS AS NEEDED 60 tablet 5  . rosuvastatin (CRESTOR) 5 MG tablet Take 1 tablet (5 mg total) by mouth daily. 90 tablet 3   No current facility-administered medications on file prior to visit.     BP 126/78   Pulse 72   Temp 98.2 F (36.8 C) (Oral)   Ht 4' 11.25" (1.505 m)   Wt 113 lb (51.3 kg)   SpO2 98%   BMI 22.63 kg/m    Objective:    Physical Exam  Constitutional: She appears well-nourished.  HENT:  Right Ear: Tympanic membrane and ear canal normal.  Left Ear: Tympanic membrane and ear canal normal.  Nose: Right sinus exhibits no maxillary sinus tenderness and no frontal sinus tenderness. Left sinus exhibits no maxillary sinus tenderness and no frontal sinus tenderness.  Mouth/Throat: Oropharynx is clear and moist.  Eyes: Conjunctivae are normal.  Neck: Neck supple.  Cardiovascular: Normal rate and regular rhythm.   Pulmonary/Chest: Effort normal and breath sounds normal. She has no wheezes. She has no rales.  Lymphadenopathy:    She has no cervical adenopathy.  Skin: Skin is warm and dry.          Assessment & Plan:  Side Effects from Vaccinations:  Symptoms and presentation today consistent for common side effects of vaccinations she received. Given that she received both in one day side effects could be more pronounced. Discussed Ibuprofen and rest for symptoms. Exam unremarkable.  Hyperkalemia:  Diagnosed by PCP Monday this week. Spoke with PCP who verified and wanted Kayexalate sent into pharmacy. Rx for kayexalate sent to [facility]. Patient will pick this up Wednesday and reschedule potassium check for Monday next week.  Sheral Flow, NP

## 2017-07-21 ENCOUNTER — Other Ambulatory Visit: Payer: Self-pay | Admitting: Internal Medicine

## 2017-07-21 DIAGNOSIS — E875 Hyperkalemia: Secondary | ICD-10-CM

## 2017-07-22 ENCOUNTER — Other Ambulatory Visit: Payer: Managed Care, Other (non HMO)

## 2017-07-26 ENCOUNTER — Other Ambulatory Visit (INDEPENDENT_AMBULATORY_CARE_PROVIDER_SITE_OTHER): Payer: Managed Care, Other (non HMO)

## 2017-07-26 DIAGNOSIS — E875 Hyperkalemia: Secondary | ICD-10-CM

## 2017-07-26 NOTE — Addendum Note (Signed)
Addended by: Ellamae Sia on: 07/26/2017 08:40 AM   Modules accepted: Orders

## 2017-07-27 ENCOUNTER — Other Ambulatory Visit: Payer: Self-pay | Admitting: Internal Medicine

## 2017-07-27 ENCOUNTER — Telehealth: Payer: Self-pay | Admitting: Internal Medicine

## 2017-07-27 DIAGNOSIS — E875 Hyperkalemia: Secondary | ICD-10-CM

## 2017-07-27 LAB — POTASSIUM: POTASSIUM: 6.3 mmol/L — AB (ref 3.5–5.2)

## 2017-07-27 MED ORDER — FUROSEMIDE 20 MG PO TABS
20.0000 mg | ORAL_TABLET | Freq: Every day | ORAL | 0 refills | Status: DC
Start: 2017-07-27 — End: 2018-05-19

## 2017-07-27 NOTE — Telephone Encounter (Signed)
See result note.  

## 2017-07-27 NOTE — Telephone Encounter (Signed)
Best number [phone] Pt called wanting to know what the next step is  Regarding her potassium

## 2017-07-28 ENCOUNTER — Other Ambulatory Visit (INDEPENDENT_AMBULATORY_CARE_PROVIDER_SITE_OTHER): Payer: Managed Care, Other (non HMO)

## 2017-07-28 DIAGNOSIS — E875 Hyperkalemia: Secondary | ICD-10-CM

## 2017-07-28 LAB — POTASSIUM: POTASSIUM: 5.1 meq/L (ref 3.5–5.1)

## 2017-07-29 ENCOUNTER — Telehealth: Payer: Self-pay

## 2017-07-29 NOTE — Telephone Encounter (Signed)
Left message on voicemail Pt has lab only appt for 08/03/17 already must keep appt

## 2017-07-29 NOTE — Telephone Encounter (Signed)
Per Morey Hummingbird E she spoke to pt and conveyed the info and scheduled appt

## 2017-07-29 NOTE — Telephone Encounter (Signed)
Yes, she should continue taking and repeat Potassium on Monday, lab only.

## 2017-07-29 NOTE — Telephone Encounter (Signed)
Pt left v/m since pt got another lab test for K pt wanting to know if pt should continue taking Lasix; pt has 3 pills left. Pt request cb.

## 2017-07-30 ENCOUNTER — Other Ambulatory Visit: Payer: Self-pay | Admitting: Internal Medicine

## 2017-07-30 DIAGNOSIS — E875 Hyperkalemia: Secondary | ICD-10-CM

## 2017-08-03 ENCOUNTER — Other Ambulatory Visit: Payer: Self-pay | Admitting: Internal Medicine

## 2017-08-03 ENCOUNTER — Other Ambulatory Visit (INDEPENDENT_AMBULATORY_CARE_PROVIDER_SITE_OTHER): Payer: Managed Care, Other (non HMO)

## 2017-08-03 DIAGNOSIS — E875 Hyperkalemia: Secondary | ICD-10-CM | POA: Diagnosis not present

## 2017-08-03 LAB — POTASSIUM: Potassium: 5.4 mEq/L — ABNORMAL HIGH (ref 3.5–5.1)

## 2017-08-05 ENCOUNTER — Telehealth: Payer: Self-pay

## 2017-08-05 ENCOUNTER — Other Ambulatory Visit (INDEPENDENT_AMBULATORY_CARE_PROVIDER_SITE_OTHER): Payer: Managed Care, Other (non HMO)

## 2017-08-05 DIAGNOSIS — E875 Hyperkalemia: Secondary | ICD-10-CM

## 2017-08-05 LAB — HEMOGLOBIN A1C: HEMOGLOBIN A1C: 5.2 % (ref 4.6–6.5)

## 2017-08-05 NOTE — Telephone Encounter (Signed)
Copied from [place] #[id number]. Topic: Quick Communication - See Telephone Encounter >> Aug 05, 2017 10:12 AM Burnis Medin, NT wrote: CRM for notification. See Telephone encounter for:  08/05/17. Pt just left doctor's visit and  has a question about when to start her 24 hour urine collection. Pt would like a calback

## 2017-08-05 NOTE — Telephone Encounter (Signed)
Patient states if she starts her 24 hour urine tomorrow she will finish on Saturday and the office is closed. She wants to know if she can do it on Sunday and bring her sample in on Monday. Told patient that would be fine- if her provider needs her to do the collection more quickly we will call and let her know where to drop the specimen over the weekend.She is clear on her instructions on how to do her collection.  Please advise patient if you need 24 hour urine before Monday as she was not advised this was a urgent collection.

## 2017-08-06 NOTE — Telephone Encounter (Signed)
Monday is fine 

## 2017-08-09 ENCOUNTER — Other Ambulatory Visit (INDEPENDENT_AMBULATORY_CARE_PROVIDER_SITE_OTHER): Payer: Managed Care, Other (non HMO)

## 2017-08-09 DIAGNOSIS — E875 Hyperkalemia: Secondary | ICD-10-CM | POA: Diagnosis not present

## 2017-08-10 LAB — POTASSIUM, URINE, 24 HOUR: POTASSIUM 24H UR: 28 mmol/(24.h) (ref 22–160)

## 2017-08-11 ENCOUNTER — Telehealth: Payer: Self-pay | Admitting: Internal Medicine

## 2017-08-11 NOTE — Telephone Encounter (Signed)
Copied from [place] #[id number]. Topic: Quick Communication - See Telephone Encounter >> Aug 11, 2017  1:23 PM Cleaster Corin, [place] wrote: CRM for notification. See Telephone encounter for:  08/11/17. Pt. Calling today has  already had labs and done 24 hour urine and would like to know the next steps

## 2017-08-12 NOTE — Telephone Encounter (Signed)
We are still waiting on the Aldosterone level to result, it should be back by Friday, per Terri in the lab.

## 2017-08-15 LAB — ALDOSTERONE + RENIN ACTIVITY W/ RATIO
ALDO / PRA RATIO: 1.5 ratio (ref 0.9–28.9)
ALDOSTERONE, SERUM: 1 ng/dL
RENIN ACTIVITY: 0.68 ng/mL/h (ref 0.25–5.82)

## 2017-08-18 ENCOUNTER — Other Ambulatory Visit: Payer: Self-pay | Admitting: Internal Medicine

## 2017-08-18 DIAGNOSIS — E875 Hyperkalemia: Secondary | ICD-10-CM

## 2017-08-19 ENCOUNTER — Other Ambulatory Visit (INDEPENDENT_AMBULATORY_CARE_PROVIDER_SITE_OTHER): Payer: Managed Care, Other (non HMO)

## 2017-08-19 DIAGNOSIS — E875 Hyperkalemia: Secondary | ICD-10-CM | POA: Diagnosis not present

## 2017-08-19 LAB — POTASSIUM: Potassium: 4.5 mEq/L (ref 3.5–5.1)

## 2017-09-08 ENCOUNTER — Other Ambulatory Visit: Payer: Self-pay | Admitting: Family Medicine

## 2017-09-08 DIAGNOSIS — Z780 Asymptomatic menopausal state: Secondary | ICD-10-CM

## 2018-02-28 ENCOUNTER — Other Ambulatory Visit: Payer: Self-pay | Admitting: Family Medicine

## 2018-02-28 DIAGNOSIS — F419 Anxiety disorder, unspecified: Principal | ICD-10-CM

## 2018-02-28 DIAGNOSIS — F329 Major depressive disorder, single episode, unspecified: Secondary | ICD-10-CM

## 2018-02-28 DIAGNOSIS — F32A Depression, unspecified: Secondary | ICD-10-CM

## 2018-02-28 NOTE — Telephone Encounter (Signed)
PCP Baity  

## 2018-03-01 NOTE — Telephone Encounter (Signed)
Call pt:  Did she stop this and now wants to restart?

## 2018-03-01 NOTE — Telephone Encounter (Signed)
Last filled 09/2016... Please advise... Has CPE appt 05/2018

## 2018-03-03 NOTE — Telephone Encounter (Signed)
Pt states she did stop medication but feels that she needs to restart

## 2018-05-19 ENCOUNTER — Ambulatory Visit (INDEPENDENT_AMBULATORY_CARE_PROVIDER_SITE_OTHER): Payer: Managed Care, Other (non HMO) | Admitting: Internal Medicine

## 2018-05-19 ENCOUNTER — Encounter: Payer: Self-pay | Admitting: Internal Medicine

## 2018-05-19 VITALS — BP 120/80 | HR 64 | Temp 97.8°F | Ht 59.0 in | Wt 114.0 lb

## 2018-05-19 DIAGNOSIS — M159 Polyosteoarthritis, unspecified: Secondary | ICD-10-CM

## 2018-05-19 DIAGNOSIS — M150 Primary generalized (osteo)arthritis: Secondary | ICD-10-CM

## 2018-05-19 DIAGNOSIS — Z1231 Encounter for screening mammogram for malignant neoplasm of breast: Secondary | ICD-10-CM

## 2018-05-19 DIAGNOSIS — F419 Anxiety disorder, unspecified: Secondary | ICD-10-CM

## 2018-05-19 DIAGNOSIS — F32A Depression, unspecified: Secondary | ICD-10-CM

## 2018-05-19 DIAGNOSIS — Z1159 Encounter for screening for other viral diseases: Secondary | ICD-10-CM | POA: Diagnosis not present

## 2018-05-19 DIAGNOSIS — F329 Major depressive disorder, single episode, unspecified: Secondary | ICD-10-CM

## 2018-05-19 DIAGNOSIS — E7800 Pure hypercholesterolemia, unspecified: Secondary | ICD-10-CM | POA: Diagnosis not present

## 2018-05-19 DIAGNOSIS — L80 Vitiligo: Secondary | ICD-10-CM | POA: Diagnosis not present

## 2018-05-19 DIAGNOSIS — Z1239 Encounter for other screening for malignant neoplasm of breast: Secondary | ICD-10-CM

## 2018-05-19 DIAGNOSIS — E559 Vitamin D deficiency, unspecified: Secondary | ICD-10-CM

## 2018-05-19 DIAGNOSIS — J449 Chronic obstructive pulmonary disease, unspecified: Secondary | ICD-10-CM | POA: Diagnosis not present

## 2018-05-19 DIAGNOSIS — Z0000 Encounter for general adult medical examination without abnormal findings: Secondary | ICD-10-CM | POA: Diagnosis not present

## 2018-05-19 DIAGNOSIS — M8949 Other hypertrophic osteoarthropathy, multiple sites: Secondary | ICD-10-CM

## 2018-05-19 MED ORDER — MELOXICAM 15 MG PO TABS: 15.0000 mg | ORAL_TABLET | Freq: Every day | ORAL | 11 refills | Status: DC

## 2018-05-19 NOTE — Assessment & Plan Note (Addendum)
Encouraged regular physical activity Will start daily Meloxicam Continue with PT

## 2018-05-19 NOTE — Assessment & Plan Note (Signed)
CMET and Lipid profile today Encouraged her to consume a low fat diet 

## 2018-05-19 NOTE — Assessment & Plan Note (Signed)
Chronic but stable on Wellbutrin and Lorazepam Will monitor

## 2018-05-19 NOTE — Assessment & Plan Note (Addendum)
She quit smoking 1 month ago Pneumovax UTD No need for inhalers at this time

## 2018-05-19 NOTE — Progress Notes (Signed)
Subjective:    Patient ID: [name] [name], female    DOB: 1960-10-31, 57 y.o.   MRN: [id number]  HPI  Pt presents to the clinic today for her annual exam. She is also due to follow up chronic conditions.  Anxiety and Depression: Chronic and stable on Wellbutrin and Lorazepam. She rarely uses Lorazepam.  COPD: She reports chronic cough but denies shortness of breath. She is not using any inhalers at this time. She quit smoking 1 month ago.  HLD: Her last LDL was 153, 07/2017. She is not taking Rosuvastatin. She does not consume a low fat diet.  OA: Mainly in her right hip s/p arthroplasty and her back. She is requesting something to take for pain. She is not taking anything OTC. She continues to go to PT every 2 weeks.  Vitiligo: She does not follow with dermatology.  Flu: 07/2017 Tetanus: 01/2010 Pneumovax: 07/2017 Pap Smear: hysterectomy Mammogram: 02/2011 Colon Screening: never Vision Screening: as needed Dentist: as needed  Diet: She does eat some meat. She consumes some fruits and veggies daily. She does eat fried foods. She drinks mostly water, coffee, soda. Exercise: stretching for PT  Review of Systems      Past Medical History:  Diagnosis Date  . Anxiety   . COPD (chronic obstructive pulmonary disease) ([place])   . Depression   . Fibroids    Uterine  . HLD (hyperlipidemia)   . OA (osteoarthritis)   . Tobacco abuse   . Vitiligo 1975  . Wears glasses     Current Outpatient Medications  Medication Sig Dispense Refill  . buPROPion (WELLBUTRIN XL) 150 MG 24 hr tablet TAKE ONE TABLET BY MOUTH ONCE DAILY 90 tablet 6  . diclofenac (VOLTAREN) 75 MG EC tablet Take 1 tablet (75 mg total) by mouth 2 (two) times daily. 60 tablet 1  . estradiol (VIVELLE-DOT) 0.075 MG/24HR Place 1 patch onto the skin 2 (two) times a week. 8 patch 0  . estradiol (VIVELLE-DOT) 0.075 MG/24HR APPLY ONE PATCH TOPICALLY TWICE A WEEK 8 patch 11  . furosemide (LASIX) 20 MG tablet Take 1 tablet  (20 mg total) by mouth daily. 5 tablet 0  . loratadine (CLARITIN) 10 MG tablet Take 10 mg by mouth daily as needed for allergies.    Marland Kitchen LORazepam (ATIVAN) 0.5 MG tablet TAKE ONE TABLET BY MOUTH EVERY 8 HOURS AS NEEDED 60 tablet 5  . rosuvastatin (CRESTOR) 5 MG tablet Take 1 tablet (5 mg total) by mouth daily. 90 tablet 3   No current facility-administered medications for this visit.     Allergies  Allergen Reactions  . Codeine Nausea Only    Pt states when given codeine Zofran will not work for the nausea     Family History  Problem Relation Age of Onset  . Depression Mother   . Cancer Father   . Depression Father   . Miscarriages / [place] Sister     Social History   Socioeconomic History  . Marital status: Married    Spouse name: Not on file  . Number of children: 3  . Years of education: Not on file  . Highest education level: Not on file  Occupational History  . 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 Financial resource strain: Not on file  . Food insecurity:    Worry: Not on file    Inability: Not on file  . Transportation needs:    Medical: Not on file    Non-medical: Not on file  Tobacco Use  . Smoking status: Current Every Day Smoker    Packs/day: 1.50    Years: 42.00    Pack years: 63.00    Types: Cigarettes  . Smokeless tobacco: Never Used  Substance and Sexual Activity  . Alcohol use: No    Comment: history of alcoholism quit 16 years ago   . Drug use: Yes    Comment: Marijuana (uses daily)  . Sexual activity: Not Currently  Lifestyle  . Physical activity:    Days per week: Not on file    Minutes per session: Not on file  . Stress: Not on file  Relationships  . Social connections:    Talks on phone: Not on file    Gets together: Not on file    Attends religious service: Not on file    Active member of club or organization: Not on file    Attends meetings of clubs or organizations: Not on file    Relationship status: Not on file  .  Intimate partner violence:    Fear of current or ex partner: Not on file    Emotionally abused: Not on file    Physically abused: Not on file    Forced sexual activity: Not on file  Other Topics Concern  . Not on file  Social History Narrative   Lives with husband in [place] 1 1/2 PPD x 38 years      3 Engineer, civil (consulting) at [employer]      Daily caffeine use: 6-8      No alcohol      Illicit drug use-marijuana     Constitutional: Denies fever, malaise, fatigue, headache or abrupt weight changes.  HEENT: Denies eye pain, eye redness, ear pain, ringing in the ears, wax buildup, runny nose, nasal congestion, bloody nose, or sore throat. Respiratory: Pt reports cough. Denies difficulty breathing, shortness of breath, cough or sputum production.   Cardiovascular: Denies chest pain, chest tightness, palpitations or swelling in the hands or feet.  Gastrointestinal: Denies abdominal pain, bloating, constipation, diarrhea or blood in the stool.  GU: Denies urgency, frequency, pain with urination, burning sensation, blood in urine, odor or discharge. Musculoskeletal: Pt reports back and shoulder pain. Denies decrease in range of motion, difficulty with gait, muscle pain or joint swelling.  Skin: Denies redness, rashes, lesions or ulcercations.  Neurological: Denies dizziness, difficulty with memory, difficulty with speech or problems with balance and coordination.  Psych: Pt reports history of anxiety and depression. Denies SI/HI.  No other specific complaints in a complete review of systems (except as listed in HPI above).  Objective:   Physical Exam   BP 120/80   Pulse 64   Temp 97.8 F (36.6 C) (Oral)   Ht 4\' 11"  (1.499 m)   Wt 114 lb (51.7 kg)   SpO2 98%   BMI 23.03 kg/m  Wt Readings from Last 3 Encounters:  05/19/18 114 lb (51.7 kg)  07/20/17 113 lb (51.3 kg)  07/19/17 114 lb 8 oz (51.9 kg)    General: Appears hier stated age, well developed, well  nourished in NAD. Skin: Warm, dry and intact.  HEENT: Head: normal shape and size; Eyes: sclera white, no icterus, conjunctiva pink, PERRLA and EOMs intact; Ears: Tm's gray and intact, normal light reflex; Throat/Mouth: Teeth present, mucosa pink and moist, no exudate, lesions or ulcerations noted.  Neck:  Neck supple, trachea midline. No masses, lumps or thyromegaly present.  Cardiovascular: Normal rate and rhythm. S1,S2 noted.  No murmur, rubs or gallops noted. No JVD or BLE edema. No carotid bruits noted. Pulmonary/Chest: Normal effort and positive vesicular breath sounds. No respiratory distress. No wheezes, rales or ronchi noted.  Abdomen: Soft and nontender. Normal bowel sounds. No distention or masses noted. Liver, spleen and kidneys non palpable. Musculoskeletal: Strength 5/5 BUE/BLE. No difficulty with gait.  Neurological: Alert and oriented. Cranial nerves II-XII grossly intact. Coordination normal.  Psychiatric: Mood and affect normal. Behavior is normal. Judgment and thought content normal.     BMET    Component Value Date/Time   NA 142 07/19/2017 1016   NA 144 09/22/2016 0810   K 4.5 08/19/2017 0914   CL 107 07/19/2017 1016   CO2 32 07/19/2017 1016   GLUCOSE 95 07/19/2017 1016   BUN 9 07/19/2017 1016   BUN 10 09/22/2016 0810   CREATININE 0.93 07/19/2017 1016   CREATININE 1.22 (H) 09/02/2016 1520   CALCIUM 10.0 07/19/2017 1016   GFRNONAA 87 09/22/2016 0810   GFRAA 101 09/22/2016 0810    Lipid Panel     Component Value Date/Time   CHOL 218 (H) 07/19/2017 1016   CHOL 257 (H) 09/22/2016 0810   TRIG 99.0 07/19/2017 1016   HDL 45.10 07/19/2017 1016   HDL 54 09/22/2016 0810   CHOLHDL 5 07/19/2017 1016   VLDL 19.8 07/19/2017 1016   LDLCALC 153 (H) 07/19/2017 1016   LDLCALC 186 (H) 09/22/2016 0810    CBC    Component Value Date/Time   WBC 5.4 09/22/2016 0810   WBC 8.7 09/02/2016 1520   RBC 5.26 09/22/2016 0810   RBC 5.35 (H) 09/02/2016 1520   HGB 16.0 (H)  09/22/2016 0810   HCT 48.1 (H) 09/22/2016 0810   PLT 198 09/22/2016 0810   MCV 91 09/22/2016 0810   MCH 30.4 09/22/2016 0810   MCH 31.0 09/02/2016 1520   MCHC 33.3 09/22/2016 0810   MCHC 34.5 09/02/2016 1520   RDW 13.6 09/22/2016 0810   LYMPHSABS 1.4 09/22/2016 0810   MONOABS 783 09/02/2016 1520   EOSABS 0.1 09/22/2016 0810   BASOSABS 0.1 09/22/2016 0810    Hgb A1C Lab Results  Component Value Date   HGBA1C 5.2 08/05/2017           Assessment & Plan:   Preventative Health Maintenance:  Encouraged her to get a flu shot in the fall Tetanus and pneumovax UTD Mammogram ordered, she will call [facility] to schedule, number provided She no longer needs pap smears She declines colon cancer screening Encouraged her to consume a balanced diet and exercise regimen Advised her to see an eye doctor and dentist annually Will check CBC, CMET, Lipid, Vit D, and Hep C today She declines HIV screening  RTC in 1 year, sooner if needed Webb Silversmith, NP

## 2018-05-19 NOTE — Addendum Note (Signed)
Addended by: Ellamae Sia on: 05/19/2018 10:08 AM   Modules accepted: Orders

## 2018-05-19 NOTE — Assessment & Plan Note (Signed)
Will monitor

## 2018-05-19 NOTE — Patient Instructions (Signed)
Health Maintenance for Postmenopausal Women Menopause is a normal process in which your reproductive ability comes to an end. This process happens gradually over a span of months to years, usually between the ages of 22 and 9. Menopause is complete when you have missed 12 consecutive menstrual periods. It is important to talk with your health care provider about some of the most common conditions that affect postmenopausal women, such as heart disease, cancer, and bone loss (osteoporosis). Adopting a healthy lifestyle and getting preventive care can help to promote your health and wellness. Those actions can also lower your chances of developing some of these common conditions. What should I know about menopause? During menopause, you may experience a number of symptoms, such as:  Moderate-to-severe hot flashes.  Night sweats.  Decrease in sex drive.  Mood swings.  Headaches.  Tiredness.  Irritability.  Memory problems.  Insomnia.  Choosing to treat or not to treat menopausal changes is an individual decision that you make with your health care provider. What should I know about hormone replacement therapy and supplements? Hormone therapy products are effective for treating symptoms that are associated with menopause, such as hot flashes and night sweats. Hormone replacement carries certain risks, especially as you become older. If you are thinking about using estrogen or estrogen with progestin treatments, discuss the benefits and risks with your health care provider. What should I know about heart disease and stroke? Heart disease, heart attack, and stroke become more likely as you age. This may be due, in part, to the hormonal changes that your body experiences during menopause. These can affect how your body processes dietary fats, triglycerides, and cholesterol. Heart attack and stroke are both medical emergencies. There are many things that you can do to help prevent heart disease  and stroke:  Have your blood pressure checked at least every 1-2 years. High blood pressure causes heart disease and increases the risk of stroke.  If you are 53-22 years old, ask your health care provider if you should take aspirin to prevent a heart attack or a stroke.  Do not use any tobacco products, including cigarettes, chewing tobacco, or electronic cigarettes. If you need help quitting, ask your health care provider.  It is important to eat a healthy diet and maintain a healthy weight. ? Be sure to include plenty of vegetables, fruits, low-fat dairy products, and lean protein. ? Avoid eating foods that are high in solid fats, added sugars, or salt (sodium).  Get regular exercise. This is one of the most important things that you can do for your health. ? Try to exercise for at least 150 minutes each week. The type of exercise that you do should increase your heart rate and make you sweat. This is known as moderate-intensity exercise. ? Try to do strengthening exercises at least twice each week. Do these in addition to the moderate-intensity exercise.  Know your numbers.Ask your health care provider to check your cholesterol and your blood glucose. Continue to have your blood tested as directed by your health care provider.  What should I know about cancer screening? There are several types of cancer. Take the following steps to reduce your risk and to catch any cancer development as early as possible. Breast Cancer  Practice breast self-awareness. ? This means understanding how your breasts normally appear and feel. ? It also means doing regular breast self-exams. Let your health care provider know about any changes, no matter how small.  If you are 40  or older, have a clinician do a breast exam (clinical breast exam or CBE) every year. Depending on your age, family history, and medical history, it may be recommended that you also have a yearly breast X-ray (mammogram).  If you  have a family history of breast cancer, talk with your health care provider about genetic screening.  If you are at high risk for breast cancer, talk with your health care provider about having an MRI and a mammogram every year.  Breast cancer (BRCA) gene test is recommended for women who have family members with BRCA-related cancers. Results of the assessment will determine the need for genetic counseling and BRCA1 and for BRCA2 testing. BRCA-related cancers include these types: ? Breast. This occurs in males or females. ? Ovarian. ? Tubal. This may also be called fallopian tube cancer. ? Cancer of the abdominal or pelvic lining (peritoneal cancer). ? Prostate. ? Pancreatic.  Cervical, Uterine, and Ovarian Cancer Your health care provider may recommend that you be screened regularly for cancer of the pelvic organs. These include your ovaries, uterus, and vagina. This screening involves a pelvic exam, which includes checking for microscopic changes to the surface of your cervix (Pap test).  For women ages 21-65, health care providers may recommend a pelvic exam and a Pap test every three years. For women ages 79-65, they may recommend the Pap test and pelvic exam, combined with testing for human papilloma virus (HPV), every five years. Some types of HPV increase your risk of cervical cancer. Testing for HPV may also be done on women of any age who have unclear Pap test results.  Other health care providers may not recommend any screening for nonpregnant women who are considered low risk for pelvic cancer and have no symptoms. Ask your health care provider if a screening pelvic exam is right for you.  If you have had past treatment for cervical cancer or a condition that could lead to cancer, you need Pap tests and screening for cancer for at least 20 years after your treatment. If Pap tests have been discontinued for you, your risk factors (such as having a new sexual partner) need to be  reassessed to determine if you should start having screenings again. Some women have medical problems that increase the chance of getting cervical cancer. In these cases, your health care provider may recommend that you have screening and Pap tests more often.  If you have a family history of uterine cancer or ovarian cancer, talk with your health care provider about genetic screening.  If you have vaginal bleeding after reaching menopause, tell your health care provider.  There are currently no reliable tests available to screen for ovarian cancer.  Lung Cancer Lung cancer screening is recommended for adults 69-62 years old who are at high risk for lung cancer because of a history of smoking. A yearly low-dose CT scan of the lungs is recommended if you:  Currently smoke.  Have a history of at least 30 pack-years of smoking and you currently smoke or have quit within the past 15 years. A pack-year is smoking an average of one pack of cigarettes per day for one year.  Yearly screening should:  Continue until it has been 15 years since you quit.  Stop if you develop a health problem that would prevent you from having lung cancer treatment.  Colorectal Cancer  This type of cancer can be detected and can often be prevented.  Routine colorectal cancer screening usually begins at  age 42 and continues through age 45.  If you have risk factors for colon cancer, your health care provider may recommend that you be screened at an earlier age.  If you have a family history of colorectal cancer, talk with your health care provider about genetic screening.  Your health care provider may also recommend using home test kits to check for hidden blood in your stool.  A small camera at the end of a tube can be used to examine your colon directly (sigmoidoscopy or colonoscopy). This is done to check for the earliest forms of colorectal cancer.  Direct examination of the colon should be repeated every  5-10 years until age 71. However, if early forms of precancerous polyps or small growths are found or if you have a family history or genetic risk for colorectal cancer, you may need to be screened more often.  Skin Cancer  Check your skin from head to toe regularly.  Monitor any moles. Be sure to tell your health care provider: ? About any new moles or changes in moles, especially if there is a change in a mole's shape or color. ? If you have a mole that is larger than the size of a pencil eraser.  If any of your family members has a history of skin cancer, especially at a young age, talk with your health care provider about genetic screening.  Always use sunscreen. Apply sunscreen liberally and repeatedly throughout the day.  Whenever you are outside, protect yourself by wearing long sleeves, pants, a wide-brimmed hat, and sunglasses.  What should I know about osteoporosis? Osteoporosis is a condition in which bone destruction happens more quickly than new bone creation. After menopause, you may be at an increased risk for osteoporosis. To help prevent osteoporosis or the bone fractures that can happen because of osteoporosis, the following is recommended:  If you are 46-71 years old, get at least 1,000 mg of calcium and at least 600 mg of vitamin D per day.  If you are older than age 55 but younger than age 65, get at least 1,200 mg of calcium and at least 600 mg of vitamin D per day.  If you are older than age 54, get at least 1,200 mg of calcium and at least 800 mg of vitamin D per day.  Smoking and excessive alcohol intake increase the risk of osteoporosis. Eat foods that are rich in calcium and vitamin D, and do weight-bearing exercises several times each week as directed by your health care provider. What should I know about how menopause affects my mental health? Depression may occur at any age, but it is more common as you become older. Common symptoms of depression  include:  Low or sad mood.  Changes in sleep patterns.  Changes in appetite or eating patterns.  Feeling an overall lack of motivation or enjoyment of activities that you previously enjoyed.  Frequent crying spells.  Talk with your health care provider if you think that you are experiencing depression. What should I know about immunizations? It is important that you get and maintain your immunizations. These include:  Tetanus, diphtheria, and pertussis (Tdap) booster vaccine.  Influenza every year before the flu season begins.  Pneumonia vaccine.  Shingles vaccine.  Your health care provider may also recommend other immunizations. This information is not intended to replace advice given to you by your health care provider. Make sure you discuss any questions you have with your health care provider. Document Released: 11/20/2005  Document Revised: 04/17/2016 Document Reviewed: 07/02/2015 Elsevier Interactive Patient Education  2018 Elsevier Inc.  

## 2018-05-20 LAB — COMPREHENSIVE METABOLIC PANEL
ALT: 11 IU/L (ref 0–32)
AST: 14 IU/L (ref 0–40)
Albumin/Globulin Ratio: 2.2 (ref 1.2–2.2)
Albumin: 4.4 g/dL (ref 3.5–5.5)
Alkaline Phosphatase: 62 IU/L (ref 39–117)
BILIRUBIN TOTAL: 0.5 mg/dL (ref 0.0–1.2)
BUN / CREAT RATIO: 12 (ref 9–23)
BUN: 12 mg/dL (ref 6–24)
CALCIUM: 9.6 mg/dL (ref 8.7–10.2)
CHLORIDE: 104 mmol/L (ref 96–106)
CO2: 27 mmol/L (ref 20–29)
Creatinine, Ser: 0.99 mg/dL (ref 0.57–1.00)
GFR, EST AFRICAN AMERICAN: 74 mL/min/{1.73_m2} (ref >59)
GFR, EST NON AFRICAN AMERICAN: 64 mL/min/{1.73_m2} (ref >59)
GLOBULIN, TOTAL: 2 g/dL (ref 1.5–4.5)
Glucose: 85 mg/dL (ref 65–99)
POTASSIUM: 5.1 mmol/L (ref 3.5–5.2)
SODIUM: 143 mmol/L (ref 134–144)
TOTAL PROTEIN: 6.4 g/dL (ref 6.0–8.5)

## 2018-05-20 LAB — CBC WITH DIFFERENTIAL/PLATELET
Basophils Absolute: 0.1 10*3/uL (ref 0.0–0.2)
Basos: 1 %
EOS (ABSOLUTE): 0.1 10*3/uL (ref 0.0–0.4)
EOS: 2 %
HEMATOCRIT: 46.3 % (ref 34.0–46.6)
Hemoglobin: 15.4 g/dL (ref 11.1–15.9)
IMMATURE GRANS (ABS): 0 10*3/uL (ref 0.0–0.1)
IMMATURE GRANULOCYTES: 0 %
Lymphocytes Absolute: 1 10*3/uL (ref 0.7–3.1)
Lymphs: 19 %
MCH: 29.8 pg (ref 26.6–33.0)
MCHC: 33.3 g/dL (ref 31.5–35.7)
MCV: 90 fL (ref 79–97)
Monocytes Absolute: 0.5 10*3/uL (ref 0.1–0.9)
Monocytes: 10 %
NEUTROS ABS: 3.5 10*3/uL (ref 1.4–7.0)
NEUTROS PCT: 68 %
PLATELETS: 175 10*3/uL (ref 150–450)
RBC: 5.17 x10E6/uL (ref 3.77–5.28)
RDW: 12.2 % — ABNORMAL LOW (ref 12.3–15.4)
WBC: 5.2 10*3/uL (ref 3.4–10.8)

## 2018-05-20 LAB — LIPID PANEL
CHOL/HDL RATIO: 4.2 ratio (ref 0.0–4.4)
Cholesterol, Total: 245 mg/dL — ABNORMAL HIGH (ref 100–199)
HDL: 59 mg/dL (ref >39)
LDL Calculated: 167 mg/dL — ABNORMAL HIGH (ref 0–99)
Triglycerides: 97 mg/dL (ref 0–149)
VLDL Cholesterol Cal: 19 mg/dL (ref 5–40)

## 2018-05-20 LAB — VITAMIN D 25 HYDROXY (VIT D DEFICIENCY, FRACTURES): VIT D 25 HYDROXY: 20.4 ng/mL — AB (ref 30.0–100.0)

## 2018-05-20 LAB — HEPATITIS C ANTIBODY

## 2018-05-21 ENCOUNTER — Other Ambulatory Visit: Payer: Self-pay | Admitting: Internal Medicine

## 2018-05-21 DIAGNOSIS — E559 Vitamin D deficiency, unspecified: Secondary | ICD-10-CM

## 2018-05-21 DIAGNOSIS — E7800 Pure hypercholesterolemia, unspecified: Secondary | ICD-10-CM

## 2018-05-21 MED ORDER — VITAMIN D (ERGOCALCIFEROL) 1.25 MG (50000 UNIT) PO CAPS
50000.0000 [IU] | ORAL_CAPSULE | ORAL | 0 refills | Status: DC
Start: 2018-05-21 — End: 2020-06-03

## 2018-05-25 ENCOUNTER — Other Ambulatory Visit: Payer: Self-pay | Admitting: Internal Medicine

## 2018-05-25 MED ORDER — ROSUVASTATIN CALCIUM 5 MG PO TABS: 5.0000 mg | ORAL_TABLET | Freq: Every day | ORAL | 2 refills | Status: DC

## 2018-06-07 ENCOUNTER — Ambulatory Visit
Admission: RE | Admit: 2018-06-07 | Discharge: 2018-06-07 | Disposition: A | Discharge status: Home / Self Care | Payer: Managed Care, Other (non HMO) | Source: Ambulatory Visit | Attending: Internal Medicine | Admitting: Internal Medicine

## 2018-06-07 DIAGNOSIS — Z1239 Encounter for other screening for malignant neoplasm of breast: Secondary | ICD-10-CM

## 2018-06-07 DIAGNOSIS — Z1231 Encounter for screening mammogram for malignant neoplasm of breast: Secondary | ICD-10-CM | POA: Insufficient documentation

## 2018-06-14 ENCOUNTER — Inpatient Hospital Stay
Admission: RE | Admit: 2018-06-14 | Discharge: 2018-06-14 | Disposition: A | Discharge status: Home / Self Care | Payer: Self-pay | Source: Ambulatory Visit | Attending: *Deleted | Admitting: *Deleted

## 2018-06-14 ENCOUNTER — Other Ambulatory Visit: Payer: Self-pay | Admitting: *Deleted

## 2018-06-14 DIAGNOSIS — Z9289 Personal history of other medical treatment: Secondary | ICD-10-CM

## 2018-08-26 ENCOUNTER — Other Ambulatory Visit (INDEPENDENT_AMBULATORY_CARE_PROVIDER_SITE_OTHER): Payer: Managed Care, Other (non HMO)

## 2018-08-26 ENCOUNTER — Other Ambulatory Visit: Payer: Self-pay | Admitting: Internal Medicine

## 2018-08-26 ENCOUNTER — Other Ambulatory Visit: Payer: Self-pay | Admitting: Family Medicine

## 2018-08-26 DIAGNOSIS — Z780 Asymptomatic menopausal state: Secondary | ICD-10-CM

## 2018-08-26 DIAGNOSIS — E559 Vitamin D deficiency, unspecified: Secondary | ICD-10-CM | POA: Diagnosis not present

## 2018-08-26 DIAGNOSIS — E7800 Pure hypercholesterolemia, unspecified: Secondary | ICD-10-CM

## 2018-08-26 LAB — LIPID PANEL
CHOLESTEROL: 198 mg/dL (ref 0–200)
HDL: 46.2 mg/dL (ref >39.00)
LDL CALC: 131 mg/dL — AB (ref 0–99)
NonHDL: 151.76
Total CHOL/HDL Ratio: 4
Triglycerides: 106 mg/dL (ref 0.0–149.0)
VLDL: 21.2 mg/dL (ref 0.0–40.0)

## 2018-08-26 LAB — COMPREHENSIVE METABOLIC PANEL
ALBUMIN: 4.2 g/dL (ref 3.5–5.2)
ALK PHOS: 51 U/L (ref 39–117)
ALT: 10 U/L (ref 0–35)
AST: 14 U/L (ref 0–37)
BUN: 12 mg/dL (ref 6–23)
CALCIUM: 9.6 mg/dL (ref 8.4–10.5)
CHLORIDE: 106 meq/L (ref 96–112)
CO2: 30 mEq/L (ref 19–32)
Creatinine, Ser: 1.03 mg/dL (ref 0.40–1.20)
GFR: 58.66 mL/min — AB (ref >60.00)
Glucose, Bld: 97 mg/dL (ref 70–99)
POTASSIUM: 4.5 meq/L (ref 3.5–5.1)
Sodium: 142 mEq/L (ref 135–145)
TOTAL PROTEIN: 6.4 g/dL (ref 6.0–8.3)
Total Bilirubin: 0.6 mg/dL (ref 0.2–1.2)

## 2018-08-26 LAB — VITAMIN D 25 HYDROXY (VIT D DEFICIENCY, FRACTURES): VITD: 57.74 ng/mL (ref 30.00–100.00)

## 2018-08-27 MED ORDER — ROSUVASTATIN CALCIUM 10 MG PO TABS: 10.0000 mg | ORAL_TABLET | Freq: Every day | ORAL | 0 refills | Status: DC

## 2018-11-29 ENCOUNTER — Other Ambulatory Visit: Payer: Self-pay | Admitting: Internal Medicine

## 2018-11-29 DIAGNOSIS — E7800 Pure hypercholesterolemia, unspecified: Secondary | ICD-10-CM

## 2018-12-05 ENCOUNTER — Telehealth: Payer: Self-pay | Admitting: Internal Medicine

## 2018-12-05 ENCOUNTER — Other Ambulatory Visit (INDEPENDENT_AMBULATORY_CARE_PROVIDER_SITE_OTHER): Payer: Managed Care, Other (non HMO)

## 2018-12-05 DIAGNOSIS — E7800 Pure hypercholesterolemia, unspecified: Secondary | ICD-10-CM

## 2018-12-05 LAB — COMPREHENSIVE METABOLIC PANEL
ALBUMIN: 4.3 g/dL (ref 3.5–5.2)
ALT: 12 U/L (ref 0–35)
AST: 14 U/L (ref 0–37)
Alkaline Phosphatase: 64 U/L (ref 39–117)
BUN: 12 mg/dL (ref 6–23)
CHLORIDE: 106 meq/L (ref 96–112)
CO2: 30 meq/L (ref 19–32)
CREATININE: 1.07 mg/dL (ref 0.40–1.20)
Calcium: 9.3 mg/dL (ref 8.4–10.5)
GFR: 52.77 mL/min — ABNORMAL LOW (ref >60.00)
Glucose, Bld: 95 mg/dL (ref 70–99)
Potassium: 4 mEq/L (ref 3.5–5.1)
SODIUM: 142 meq/L (ref 135–145)
Total Bilirubin: 0.4 mg/dL (ref 0.2–1.2)
Total Protein: 6.5 g/dL (ref 6.0–8.3)

## 2018-12-05 LAB — LIPID PANEL
CHOL/HDL RATIO: 3
CHOLESTEROL: 151 mg/dL (ref 0–200)
HDL: 45.9 mg/dL (ref >39.00)
LDL CALC: 88 mg/dL (ref 0–99)
NONHDL: 104.9
Triglycerides: 87 mg/dL (ref 0.0–149.0)
VLDL: 17.4 mg/dL (ref 0.0–40.0)

## 2018-12-05 NOTE — Telephone Encounter (Signed)
Her breathing or arthritic conditions are not bad enough to warrant a handicap placard.

## 2018-12-05 NOTE — Telephone Encounter (Signed)
Pt dropped off disability parking placard to be signed.  In [facility] up front

## 2018-12-06 ENCOUNTER — Telehealth: Payer: Self-pay | Admitting: Internal Medicine

## 2018-12-06 NOTE — Telephone Encounter (Signed)
Patient had labs done yesterday.  Patient would like to know if there's anything she should be doing since her GFR dropped again. Patient has been drinking more water since the last time she had lab work done.  Please call patient.

## 2018-12-06 NOTE — Telephone Encounter (Signed)
[facility] Patient Name: [name] [name] Gender: Female DOB: June 05, 1961 Age: 58 Y 26 M 20 D Return Phone Number: [phone] (Primary) Address: City/State/Zip: [name] [address] Client [facility] Night - Client Client Site [facility] Physician Webb Silversmith - NP Contact Type Call Who Is Calling Patient / Member / Family / Caregiver Call Type Triage / Clinical Relationship To Patient Self Return Phone Number [phone] (Primary) Chief Complaint Urinary Retention (> THREE MONTHS) Reason for Call Symptomatic / Request for [place] states had blood work yesterday; wants the results; GFR went from 58 to-52.77. How to proceed? kidney function has decreased; no sx Translation No Nurse Assessment Nurse: Thurmond Butts, RN, Kirke Shaggy Date/Time (Eastern Time): 12/06/2018 8:12:13 AM Confirm and document reason for call. If symptomatic, describe symptoms. ---Patient noticed her GFR went from 13 to-52.77. She wants to know how to proceed? They her told quit taking meloxicam and drink water. Yesterday was the 3 mo follow up. No symptoms that she knows of, just went in for her follow up. Has the patient traveled to [place] OR had close contact with a person known to have the novel coronavirus illness in the last 14 days? ---Not Applicable Does the patient have any new or worsening symptoms? ---No Please document clinical information provided and list any resource used. ---RN warm transferred to the office. Guidelines Guideline Title Affirmed Question Affirmed Notes Nurse Date/Time (Eastern Time) Disp. Time Eilene Ghazi Time) Disposition Final User 12/06/2018 8:03:22 AM Attempt made - message left Rosie Fate 12/06/2018 8:15:10 AM Attempt made - message left Rosie Fate 12/06/2018 8:15:30 AM Clinical Call Yes Thurmond Butts, RN,  Kirke Shaggy

## 2018-12-06 NOTE — Telephone Encounter (Signed)
Pt is aware as instructed 

## 2018-12-07 ENCOUNTER — Other Ambulatory Visit: Payer: Self-pay | Admitting: Internal Medicine

## 2018-12-07 MED ORDER — ROSUVASTATIN CALCIUM 10 MG PO TABS: 10.0000 mg | ORAL_TABLET | Freq: Every day | ORAL | 1 refills | Status: DC

## 2018-12-13 ENCOUNTER — Other Ambulatory Visit: Payer: Self-pay | Admitting: Internal Medicine

## 2018-12-14 ENCOUNTER — Other Ambulatory Visit: Payer: Self-pay | Admitting: Internal Medicine

## 2018-12-14 DIAGNOSIS — R944 Abnormal results of kidney function studies: Secondary | ICD-10-CM

## 2018-12-19 ENCOUNTER — Other Ambulatory Visit (INDEPENDENT_AMBULATORY_CARE_PROVIDER_SITE_OTHER): Payer: Managed Care, Other (non HMO)

## 2018-12-19 DIAGNOSIS — R944 Abnormal results of kidney function studies: Secondary | ICD-10-CM | POA: Diagnosis not present

## 2018-12-19 LAB — BASIC METABOLIC PANEL
BUN: 9 mg/dL (ref 6–23)
CO2: 28 mEq/L (ref 19–32)
CREATININE: 0.94 mg/dL (ref 0.40–1.20)
Calcium: 9.3 mg/dL (ref 8.4–10.5)
Chloride: 106 mEq/L (ref 96–112)
GFR: 61.27 mL/min (ref >60.00)
GLUCOSE: 85 mg/dL (ref 70–99)
POTASSIUM: 4.2 meq/L (ref 3.5–5.1)
Sodium: 140 mEq/L (ref 135–145)

## 2019-04-17 ENCOUNTER — Telehealth: Payer: Self-pay

## 2019-04-17 DIAGNOSIS — Z780 Asymptomatic menopausal state: Secondary | ICD-10-CM

## 2019-04-17 MED ORDER — ESTRADIOL 0.075 MG/24HR TD PTTW: MEDICATED_PATCH | TRANSDERMAL | 1 refills | Status: DC

## 2019-04-17 NOTE — Telephone Encounter (Signed)
Copied from [place] [id number]. Topic: Quick Communication - Rx Refill/Question >> Apr 14, 2019 12:16 PM Virl Axe D wrote: Medication: estradiol (VIVELLE-DOT) 0.075 MG/24HR   Has the patient contacted their pharmacy? Yes.   (Agent: If no, request that the patient contact the pharmacy for the refill.) (Agent: If yes, when and what did the pharmacy advise?)  Preferred Pharmacy (with phone number or street name): [facility] [address] [place] - [place] [phone] (Phone) [phone] (Fax)    Agent: Please be advised that RX refills may take up to 3 business days. We ask that you follow-up with your pharmacy.

## 2019-04-17 NOTE — Addendum Note (Signed)
Addended by: Lurlean Nanny on: 04/17/2019 02:21 PM   Modules accepted: Orders

## 2019-04-17 NOTE — Telephone Encounter (Signed)
Looks like this was being filled by you... please advise if appropriate to refill

## 2019-04-17 NOTE — Telephone Encounter (Signed)
Rx sent through e-scribe  

## 2019-04-17 NOTE — Telephone Encounter (Signed)
Ok to refill 

## 2019-07-13 ENCOUNTER — Other Ambulatory Visit: Payer: Self-pay | Admitting: Internal Medicine

## 2019-07-13 DIAGNOSIS — Z780 Asymptomatic menopausal state: Secondary | ICD-10-CM

## 2019-08-25 ENCOUNTER — Other Ambulatory Visit: Payer: Self-pay | Admitting: Internal Medicine

## 2019-08-25 DIAGNOSIS — Z780 Asymptomatic menopausal state: Secondary | ICD-10-CM

## 2019-08-28 ENCOUNTER — Other Ambulatory Visit: Payer: Self-pay | Admitting: Internal Medicine

## 2019-08-28 DIAGNOSIS — Z780 Asymptomatic menopausal state: Secondary | ICD-10-CM

## 2019-10-12 ENCOUNTER — Other Ambulatory Visit: Payer: Self-pay | Admitting: Internal Medicine

## 2019-10-12 DIAGNOSIS — Z780 Asymptomatic menopausal state: Secondary | ICD-10-CM

## 2019-10-12 NOTE — Telephone Encounter (Signed)
Patient Called in regards to her Estradiol Patch.  Patient stated that she has to call every month in order to get a refill on her patches.  She wanted to know why a 1 year supply could be called in for her.  So she doesn't have to continue calling every time she is needing a refill

## 2019-10-16 ENCOUNTER — Other Ambulatory Visit: Payer: Self-pay | Admitting: Internal Medicine

## 2019-10-16 DIAGNOSIS — Z780 Asymptomatic menopausal state: Secondary | ICD-10-CM

## 2019-10-16 NOTE — Telephone Encounter (Signed)
She is past due for her CPE, last done 05/2018. That is why she has only been getting a 30 day supply. She needs to schedule CPE. Webb Silversmith, NP

## 2019-10-31 NOTE — Telephone Encounter (Signed)
Overdue CPE letter mailed 

## 2020-01-03 ENCOUNTER — Other Ambulatory Visit: Payer: Self-pay | Admitting: Family Medicine

## 2020-01-03 DIAGNOSIS — Z1231 Encounter for screening mammogram for malignant neoplasm of breast: Secondary | ICD-10-CM

## 2020-01-03 DIAGNOSIS — E2839 Other primary ovarian failure: Secondary | ICD-10-CM

## 2020-01-04 ENCOUNTER — Encounter: Payer: Self-pay | Admitting: Family Medicine

## 2020-01-22 ENCOUNTER — Ambulatory Visit
Admission: RE | Admit: 2020-01-22 | Discharge: 2020-01-22 | Disposition: A | Discharge status: Home / Self Care | Payer: Medicare HMO | Source: Ambulatory Visit | Attending: Family Medicine | Admitting: Family Medicine

## 2020-01-22 DIAGNOSIS — E2839 Other primary ovarian failure: Secondary | ICD-10-CM | POA: Insufficient documentation

## 2020-01-22 DIAGNOSIS — Z1231 Encounter for screening mammogram for malignant neoplasm of breast: Secondary | ICD-10-CM | POA: Insufficient documentation

## 2020-01-22 DIAGNOSIS — M8589 Other specified disorders of bone density and structure, multiple sites: Secondary | ICD-10-CM | POA: Diagnosis not present

## 2020-02-22 ENCOUNTER — Telehealth: Payer: Medicare HMO

## 2020-03-18 DIAGNOSIS — E782 Mixed hyperlipidemia: Secondary | ICD-10-CM | POA: Diagnosis not present

## 2020-03-18 DIAGNOSIS — R69 Illness, unspecified: Secondary | ICD-10-CM | POA: Diagnosis not present

## 2020-03-18 DIAGNOSIS — J441 Chronic obstructive pulmonary disease with (acute) exacerbation: Secondary | ICD-10-CM | POA: Diagnosis not present

## 2020-03-18 DIAGNOSIS — M130 Polyarthritis, unspecified: Secondary | ICD-10-CM | POA: Diagnosis not present

## 2020-03-21 DIAGNOSIS — J441 Chronic obstructive pulmonary disease with (acute) exacerbation: Secondary | ICD-10-CM | POA: Diagnosis not present

## 2020-03-21 DIAGNOSIS — R69 Illness, unspecified: Secondary | ICD-10-CM | POA: Diagnosis not present

## 2020-03-21 DIAGNOSIS — M130 Polyarthritis, unspecified: Secondary | ICD-10-CM | POA: Diagnosis not present

## 2020-03-21 DIAGNOSIS — Z0000 Encounter for general adult medical examination without abnormal findings: Secondary | ICD-10-CM | POA: Diagnosis not present

## 2020-04-18 DIAGNOSIS — R69 Illness, unspecified: Secondary | ICD-10-CM | POA: Diagnosis not present

## 2020-04-18 DIAGNOSIS — J441 Chronic obstructive pulmonary disease with (acute) exacerbation: Secondary | ICD-10-CM | POA: Diagnosis not present

## 2020-04-18 DIAGNOSIS — M940 Chondrocostal junction syndrome [Tietze]: Secondary | ICD-10-CM | POA: Diagnosis not present

## 2020-04-18 DIAGNOSIS — M75100 Unspecified rotator cuff tear or rupture of unspecified shoulder, not specified as traumatic: Secondary | ICD-10-CM | POA: Diagnosis not present

## 2020-05-09 DIAGNOSIS — R69 Illness, unspecified: Secondary | ICD-10-CM | POA: Diagnosis not present

## 2020-05-09 DIAGNOSIS — E8941 Symptomatic postprocedural ovarian failure: Secondary | ICD-10-CM | POA: Diagnosis not present

## 2020-05-09 DIAGNOSIS — J441 Chronic obstructive pulmonary disease with (acute) exacerbation: Secondary | ICD-10-CM | POA: Diagnosis not present

## 2020-05-20 ENCOUNTER — Ambulatory Visit: Payer: Medicare HMO | Admitting: Physician Assistant

## 2020-05-27 ENCOUNTER — Ambulatory Visit: Payer: Medicare HMO | Admitting: Orthopaedic Surgery

## 2020-05-27 ENCOUNTER — Ambulatory Visit (INDEPENDENT_AMBULATORY_CARE_PROVIDER_SITE_OTHER): Payer: Medicare HMO

## 2020-05-27 DIAGNOSIS — M25511 Pain in right shoulder: Secondary | ICD-10-CM

## 2020-05-27 NOTE — Progress Notes (Signed)
Office Visit Note   Patient: [name] [name]           Date of Birth: Feb 02, 1961           MRN: [id number] Visit Date: 05/27/2020              Requested by: Lucianne Lei, MD [address] [place],  [place] [address] PCP: Lucianne Lei, MD   Assessment & Plan: Visit Diagnoses:  1. Right shoulder pain, unspecified chronicity     Plan: Given the fact that her right shoulder pain is dissipated so much with just the oral steroid, we will hold off on any other intervention.  Her clinical exam shows that she is doing much better and she does show some signs of shoulder impingement.  I did let her know that if this flares up again do not hesitate to call us directly because we can then see her to try steroid injection in her right shoulder if needed.  She agrees with this treatment plan.  All questions and concerns were answered and addressed.  Follow-up is as needed.  Follow-Up Instructions: Return if symptoms worsen or fail to improve.   Orders:  Orders Placed This Encounter  Procedures  . XR Shoulder Right   No orders of the defined types were placed in this encounter.     Procedures: No procedures performed   Clinical Data: No additional findings.   Subjective: Chief Complaint  Patient presents with  . Right Shoulder - Pain  The patient is a very pleasant 59 year old female that I have actually seen years ago as a patient and replaced the hip.  She developed acute right shoulder pain in early July.  Dr. Criss Rosales her primary care physician appropriately put her on a steroid.  Since she made the appointment with me, she says her shoulder pain is almost completely gone and she is doing well overall.  She has been hurting with overhead activities and reaching behind her.  She has not had any type of injury to that shoulder.  HPI  Review of Systems She current denies a headache, chest pain, shortness of breath, fever, chills, nausea, vomiting  Objective: Vital Signs: There  were no vitals taken for this visit.  Physical Exam She is alert and oriented x3 and in no acute distress Ortho Exam Examination of her right shoulder shows full and fluid range of motion.  There is some signs of impingement but it is mild.  Her rotator cuff is strong on exam. Specialty Comments:  No specialty comments available.  Imaging: XR Shoulder Right  Result Date: 05/27/2020 3 views of the right shoulder show no acute findings.    PMFS History: Patient Active Problem List   Diagnosis Date Noted  . COPD (chronic obstructive pulmonary disease) ([place]) 07/19/2017  . Vitiligo 04/12/2012  . HLD (hyperlipidemia) 02/06/2010  . Anxiety and depression 02/06/2010  . Osteoarthritis 02/06/2010   Past Medical History:  Diagnosis Date  . Anxiety   . COPD (chronic obstructive pulmonary disease) ([place])   . Depression   . Fibroids    Uterine  . HLD (hyperlipidemia)   . OA (osteoarthritis)   . Tobacco abuse   . Vitiligo 1975  . Wears glasses     Family History  Problem Relation Age of Onset  . Depression Mother   . Cancer Father   . Depression Father   . Miscarriages / Stillbirths Sister     Past Surgical History:  Procedure Laterality Date  .  ABDOMINAL HYSTERECTOMY  2011  . DILATION AND CURETTAGE OF UTERUS    . HIP ARTHROSCOPY  2003   right   . MANDIBLE SURGERY  1980  . TOTAL HIP ARTHROPLASTY Right 12/27/2015   Procedure: RIGHT TOTAL HIP ARTHROPLASTY ANTERIOR APPROACH;  Surgeon: Mcarthur Rossetti, MD;  Location: [facility];  Service: Orthopedics;  Laterality: Right;  . TUBAL LIGATION    . URETER SURGERY  2011   Social History   Occupational History  . 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 Smoking status: Former Smoker    Packs/day: 0.00    Years: 42.00    Pack years: 0.00    Types: Cigarettes  . Smokeless tobacco: Never Used  . Tobacco comment: recently quit 04/2018  Substance and Sexual Activity  . Alcohol use: No    Comment: history of alcoholism  quit 16 years ago   . Drug use: Yes    Comment: Marijuana (uses daily)  . Sexual activity: Not Currently

## 2020-06-03 ENCOUNTER — Encounter: Payer: Self-pay | Admitting: Family Medicine

## 2020-06-03 ENCOUNTER — Other Ambulatory Visit: Payer: Self-pay

## 2020-06-03 ENCOUNTER — Ambulatory Visit (INDEPENDENT_AMBULATORY_CARE_PROVIDER_SITE_OTHER): Payer: Medicare HMO | Admitting: Family Medicine

## 2020-06-03 VITALS — BP 140/90 | HR 81 | Temp 97.9°F | Ht 59.5 in | Wt 116.5 lb

## 2020-06-03 DIAGNOSIS — E7800 Pure hypercholesterolemia, unspecified: Secondary | ICD-10-CM

## 2020-06-03 DIAGNOSIS — Z720 Tobacco use: Secondary | ICD-10-CM | POA: Diagnosis not present

## 2020-06-03 DIAGNOSIS — J449 Chronic obstructive pulmonary disease, unspecified: Secondary | ICD-10-CM | POA: Diagnosis not present

## 2020-06-03 DIAGNOSIS — M150 Primary generalized (osteo)arthritis: Secondary | ICD-10-CM

## 2020-06-03 DIAGNOSIS — M8949 Other hypertrophic osteoarthropathy, multiple sites: Secondary | ICD-10-CM

## 2020-06-03 DIAGNOSIS — Z780 Asymptomatic menopausal state: Secondary | ICD-10-CM | POA: Diagnosis not present

## 2020-06-03 DIAGNOSIS — M159 Polyosteoarthritis, unspecified: Secondary | ICD-10-CM

## 2020-06-03 MED ORDER — ALBUTEROL SULFATE HFA 108 (90 BASE) MCG/ACT IN AERS: 1.0000 | INHALATION_SPRAY | Freq: Four times a day (QID) | RESPIRATORY_TRACT | 3 refills | Status: DC | PRN

## 2020-06-03 MED ORDER — ESTRADIOL 0.05 MG/24HR TD PTTW: 1.0000 | MEDICATED_PATCH | TRANSDERMAL | 12 refills | Status: DC

## 2020-06-03 NOTE — Progress Notes (Signed)
Subjective:     [name] [name] is a 59 y.o. female presenting for Establish Care     HPI  #Anxiety - rare use of lorazepam - taking wellbutrin  #Right shoulder pain - taking etodolac - saw ortho  - improving on medication - also taking prednisone w/ improvement    Review of Systems   Social History   Tobacco Use  Smoking Status Current Every Day Smoker  . Packs/day: 1.00  . Years: 44.00  . Pack years: 44.00  . Types: Cigarettes  Smokeless Tobacco Never Used  Tobacco Comment   recently quit 04/2018        Objective:    BP Readings from Last 3 Encounters:  06/03/20 140/90  05/19/18 120/80  07/20/17 126/78   Wt Readings from Last 3 Encounters:  06/03/20 116 lb 8 oz (52.8 kg)  05/19/18 114 lb (51.7 kg)  07/20/17 113 lb (51.3 kg)    BP 140/90   Pulse 81   Temp 97.9 F (36.6 C) (Temporal)   Ht 4' 11.5" (1.511 m)   Wt 116 lb 8 oz (52.8 kg)   SpO2 97%   BMI 23.14 kg/m    Physical Exam Constitutional:      General: She is not in acute distress.    Appearance: She is well-developed. She is not diaphoretic.  HENT:     Right Ear: External ear normal.     Left Ear: External ear normal.     Nose: Nose normal.  Eyes:     Conjunctiva/sclera: Conjunctivae normal.  Cardiovascular:     Rate and Rhythm: Normal rate and regular rhythm.     Heart sounds: No murmur heard.   Pulmonary:     Effort: Pulmonary effort is normal. No respiratory distress.     Breath sounds: Normal breath sounds. No wheezing.  Musculoskeletal:     Cervical back: Neck supple.  Skin:    General: Skin is warm and dry.     Capillary Refill: Capillary refill takes less than 2 seconds.  Neurological:     Mental Status: She is alert. Mental status is at baseline.  Psychiatric:        Mood and Affect: Mood normal.        Behavior: Behavior normal.           Assessment & Plan:   Problem List Items Addressed This Visit      Respiratory   COPD (chronic obstructive  pulmonary disease) ([place])    Reports rare symptoms but occasional SOB. Albuterol prn prescribed. Advised to stop smoking - not interested at this time.       Relevant Medications   albuterol (VENTOLIN HFA) 108 (90 Base) MCG/ACT inhaler     Musculoskeletal and Integument   Osteoarthritis - Primary    Advised tumeric trial      Relevant Medications   etodolac (LODINE) 400 MG tablet     Other   HLD (hyperlipidemia)    Cont crestor. Obtain outside labs      Relevant Medications   rosuvastatin (CRESTOR) 20 MG tablet   Tobacco use    Briefly quit smoking but restart. Not interested in quitting today. Will offer lung cancer screening at next visit.       Menopause    On estrogen since 2012. Discussed she is at the age where stopping is appropriate. Handout for hot flashes and discussed decreasing the dose of her patch from 0.075>0.05. Return 3 months for check in.  Return in about 3 months (around 09/03/2020).  Lesleigh Noe, MD  This visit occurred during the SARS-CoV-2 public health emergency.  Safety protocols were in place, including screening questions prior to the visit, additional usage of staff PPE, and extensive cleaning of exam room while observing appropriate contact time as indicated for disinfecting solutions.

## 2020-06-03 NOTE — Assessment & Plan Note (Signed)
Reports rare symptoms but occasional SOB. Albuterol prn prescribed. Advised to stop smoking - not interested at this time.

## 2020-06-03 NOTE — Assessment & Plan Note (Addendum)
On estrogen since 2012. Discussed she is at the age where stopping is appropriate. Handout for hot flashes and discussed decreasing the dose of her patch from 0.075>0.05. Return 3 months for check in.

## 2020-06-03 NOTE — Assessment & Plan Note (Signed)
Advised tumeric trial

## 2020-06-03 NOTE — Assessment & Plan Note (Signed)
Briefly quit smoking but restart. Not interested in quitting today. Will offer lung cancer screening at next visit.

## 2020-06-03 NOTE — Assessment & Plan Note (Signed)
Cont crestor. Obtain outside labs

## 2020-06-03 NOTE — Patient Instructions (Addendum)
#  Curcumin or Tumeric  Research has shown that Curcumin (Tumeric) supplements are just as good as high dose anti-inflammatory medications (like diclofenac and ibuprofen) at treating pain from osteoarthritis without the side effects.   Take Curcumin 500 mg Three times daily   -- You can find a bottle at [employer] for $8 which should last a month -- [place] is around $9 and is available at [facility]  After you decrease the dose of estrogen - schedule a follow-up to see me about 2-3 months after you are on the lower dose  Menopause Menopause may increase your risk for:  Loss of bone (osteoporosis), which causes bone breaks (fractures).  Depression.  Hardening and narrowing of the arteries (atherosclerosis), which can cause heart attacks and strokes. What are the causes? This condition is usually caused by a natural change in hormone levels that happens as you get older. The condition may also be caused by surgery to remove both ovaries (bilateral oophorectomy). Follow these instructions at home: Lifestyle  Do not use any products that contain nicotine or tobacco, such as cigarettes and e-cigarettes. If you need help quitting, ask your health care provider.  Get at least 30 minutes of physical activity on 5 or more days each week.  Avoid alcoholic and caffeinated beverages, as well as spicy foods. This may help prevent hot flashes.  Get 7-8 hours of sleep each night.  If you have hot flashes, try: ? Dressing in layers. ? Avoiding things that may trigger hot flashes, such as spicy food, hot drinks, alcohol, caffeine, warm places, or stress. ? Taking slow, deep breaths when a hot flash starts. ? Keeping a fan in your home and office.  Find ways to manage stress: regular exercise, meditation, yoga, qigong, Tai Chi, biofeedback, acupuncture or massage  Consider going to group therapy with other women who are having menopause symptoms. Ask your health care provider about  recommended group therapy meetings.  Staying cool while sleeping: dress in light clothing, use layed bedding that can be easily removed, Sleep with a fan nearby, put an ice pack under your pillow and flip your pillow regularly  Eating and drinking  Eat a healthy, balanced diet that contains whole grains, lean protein, low-fat dairy, and plenty of fruits and vegetables.  Your health care provider may recommend adding more soy to your diet. Foods that contain soy include tofu, tempeh, and soy milk.  Eat plenty of foods that contain calcium and vitamin D for bone health. Items that are rich in calcium include low-fat milk, yogurt, beans, almonds, sardines, broccoli, and kale. Medicines  Non-prescription medications for Hot Flashes ? Soy - eat 1-2 servings of soy foods daily ? Herbs: like black cohosh have shown some improvement with hot flashes  Talk with your health care provider before starting any herbal supplements. If prescribed, take vitamins and supplements as told by your health care provider. These may include: ? Calcium. Women age 53 and older should get 1,200 mg (milligrams) of calcium every day. ? Vitamin D. Women need 600-800 International Units of vitamin D each day.  Prescription Medications ? Hormone Treatment: increased risk of breast cancer and cardiovascular disease. Should be used for a short time and in women under 60 have a lower risk overall if they take it ? Non-Hormone Treatment: Paroxetine which is an antidepressant, has been shown to reduce Hot Flashes

## 2021-04-06 DIAGNOSIS — Z8709 Personal history of other diseases of the respiratory system: Secondary | ICD-10-CM | POA: Diagnosis not present

## 2021-04-06 DIAGNOSIS — J441 Chronic obstructive pulmonary disease with (acute) exacerbation: Secondary | ICD-10-CM | POA: Diagnosis not present

## 2021-06-13 ENCOUNTER — Other Ambulatory Visit: Payer: Self-pay | Admitting: Family Medicine

## 2021-06-13 DIAGNOSIS — Z780 Asymptomatic menopausal state: Secondary | ICD-10-CM

## 2021-06-13 NOTE — Telephone Encounter (Signed)
Last refilled on 06/03/20 # 8 patches, 12 refills LOV 06/03/20 Establish care  No future appointments

## 2021-06-14 NOTE — Telephone Encounter (Signed)
From Dr. Verda Cumins note in August of 2021 she was supposed to return 3 months later (around November) and failed to do so.  Please contact patient and have her set up to follow up with Dr. Einar Pheasant ASAP. She is overdue and will need to be seen for further refills.

## 2021-06-25 ENCOUNTER — Ambulatory Visit (INDEPENDENT_AMBULATORY_CARE_PROVIDER_SITE_OTHER): Payer: Medicare HMO | Admitting: Family Medicine

## 2021-06-25 ENCOUNTER — Other Ambulatory Visit: Payer: Self-pay

## 2021-06-25 VITALS — BP 140/88 | HR 80 | Temp 97.0°F | Ht 59.5 in | Wt 115.5 lb

## 2021-06-25 DIAGNOSIS — Z720 Tobacco use: Secondary | ICD-10-CM

## 2021-06-25 DIAGNOSIS — E7800 Pure hypercholesterolemia, unspecified: Secondary | ICD-10-CM

## 2021-06-25 DIAGNOSIS — Z780 Asymptomatic menopausal state: Secondary | ICD-10-CM | POA: Diagnosis not present

## 2021-06-25 DIAGNOSIS — R079 Chest pain, unspecified: Secondary | ICD-10-CM | POA: Insufficient documentation

## 2021-06-25 DIAGNOSIS — R030 Elevated blood-pressure reading, without diagnosis of hypertension: Secondary | ICD-10-CM | POA: Diagnosis not present

## 2021-06-25 DIAGNOSIS — Z0000 Encounter for general adult medical examination without abnormal findings: Secondary | ICD-10-CM

## 2021-06-25 LAB — LIPID PANEL
Cholesterol: 245 mg/dL — ABNORMAL HIGH (ref 0–200)
HDL: 47.6 mg/dL (ref >39.00)
LDL Cholesterol: 169 mg/dL — ABNORMAL HIGH (ref 0–99)
NonHDL: 197.04
Total CHOL/HDL Ratio: 5
Triglycerides: 138 mg/dL (ref 0.0–149.0)
VLDL: 27.6 mg/dL (ref 0.0–40.0)

## 2021-06-25 LAB — COMPREHENSIVE METABOLIC PANEL
ALT: 8 U/L (ref 0–35)
AST: 12 U/L (ref 0–37)
Albumin: 4.2 g/dL (ref 3.5–5.2)
Alkaline Phosphatase: 57 U/L (ref 39–117)
BUN: 10 mg/dL (ref 6–23)
CO2: 28 mEq/L (ref 19–32)
Calcium: 9.6 mg/dL (ref 8.4–10.5)
Chloride: 104 mEq/L (ref 96–112)
Creatinine, Ser: 0.86 mg/dL (ref 0.40–1.20)
GFR: 73.63 mL/min (ref >60.00)
Glucose, Bld: 78 mg/dL (ref 70–99)
Potassium: 4 mEq/L (ref 3.5–5.1)
Sodium: 140 mEq/L (ref 135–145)
Total Bilirubin: 0.7 mg/dL (ref 0.2–1.2)
Total Protein: 6.3 g/dL (ref 6.0–8.3)

## 2021-06-25 LAB — CBC
HCT: 47.5 % — ABNORMAL HIGH (ref 36.0–46.0)
Hemoglobin: 15.9 g/dL — ABNORMAL HIGH (ref 12.0–15.0)
MCHC: 33.4 g/dL (ref 30.0–36.0)
MCV: 90.8 fl (ref 78.0–100.0)
Platelets: 153 10*3/uL (ref 150.0–400.0)
RBC: 5.23 Mil/uL — ABNORMAL HIGH (ref 3.87–5.11)
RDW: 13.4 % (ref 11.5–15.5)
WBC: 5.9 10*3/uL (ref 4.0–10.5)

## 2021-06-25 MED ORDER — ESTRADIOL 0.025 MG/24HR TD PTTW: 1.0000 | MEDICATED_PATCH | TRANSDERMAL | 1 refills | Status: DC

## 2021-06-25 NOTE — Patient Instructions (Addendum)
#  Referral I have placed a referral to a specialist for you. You should receive a phone call from the specialty office. Make sure your voicemail is not full and that if you are able to answer your phone to unknown or new numbers.   It may take up to 2 weeks to hear about the referral. If you do not hear anything in 2 weeks, please call our office and ask to speak with the referral coordinator.   -- Lung Cancer screening referral   Decrease estrogen as tolerated   Check with your pharmacy with about cost for Tdap and Shingrix Vaccine  Your blood pressure high.   High blood pressure increases your risk for heart attack and stroke.     To check your blood pressure 1) Sit in a quiet and relaxed place for 5 minutes 2) Make sure your feet are flat on the ground 3) Consider checking first thing in the morning   Normal blood pressure is less than 140/90 Ideally you blood pressure should be around 120/80  Other ways you can reduce your blood pressure:  1) Regular exercise -- Try to get 150 minutes (30 minutes, 5 days a week) of moderate to vigorous aerobic excercise -- Examples: brisk walking (2.5 miles per hour), water aerobics, dancing, gardening, tennis, biking slower than 10 miles per hour 2) DASH Diet - low fat meats, more fresh fruits and vegetables, whole grains, low salt 3) Quit smoking if you smoke 4) Loose 5-10% of your body weight    Would recommend the following - bone health:   1) 800 units of Vitamin D daily 2) Get 1200 mg of elemental calcium --- this is best from your diet. Try to track how much calcium you get on a typical day. You could find ways to add more (dairy products, leafy greens). Take a supplement for whatever you don't typically get so you reach 1200 mg of calcium.  3) Physical activity (ideally weight bearing) - like walking briskly 30 minutes 5 days a week.

## 2021-06-25 NOTE — Assessment & Plan Note (Signed)
Pt stopped crestor. Will reassess and address need.

## 2021-06-25 NOTE — Assessment & Plan Note (Signed)
Decrease estrogen patch 0.05>0.025 twice weekly.

## 2021-06-25 NOTE — Progress Notes (Signed)
Annual Exam   Chief Complaint:  Chief Complaint  Patient presents with   Follow-up    Estrogen patches  Declined Flu shot Never had a colonoscopy    History of Present Illness:  Ms. [name] [name] is a 60 y.o. No obstetric history on file. who LMP was No LMP recorded. Patient has had a hysterectomy., presents today for her annual examination.     Nutrition She does not get adequate calcium and Vitamin D in her diet. Diet: eats 2 meals a day, lots of junk food Exercise: not currently  Safety The patient wears seatbelts: yes.     The patient feels safe at home and in their relationships: yes.  Social History   Tobacco Use  Smoking Status Every Day   Packs/day: 1.00   Years: 44.00   Pack years: 44.00   Types: Cigarettes  Smokeless Tobacco Never  Tobacco Comments   recently quit 04/2018   Dentist: yes Eye doctor: not recently   Menstrual:  Symptoms of menopause: using patches once every 5 days S/p hysterectomy   GYN She is single partner, contraception - post menopausal status.    Cervical Cancer Screening (21-65):   Last Pap:   October 2011 Results were: no abnormalities /neg HPV DNA  - s/p hysterectomy   Breast Cancer Screening (Age 65-74):  There is no FH of breast cancer. There is no FH of ovarian cancer. BRCA screening Not Indicated.  Last Mammogram: 01/22/2020 The patient does want a mammogram this year.    Colon Cancer Screening:  Age 38-75 yo - benefits outweigh the risk. Adults 18-85 yo who have never been screened benefit.  Benefits: 134000 people in 2016 will be diagnosed and 49,000 will die - early detection helps [place]: Complications 2/2 to colonoscopy High Risk (Colonoscopy): genetic disorder (Lynch syndrome or familial adenomatous polyposis), personal hx of IBD, previous adenomatous polyp, or previous colorectal cancer, FamHx start 10 years before the age at diagnosis, increased in males and black race  Options:  FIT - looks for hemoglobin  (blood in the stool) - specific and fairly sensitive - must be done annually Cologuard - looks for DNA and blood - more sensitive - therefore can have more false positives, every 3 years Colonoscopy - every 10 years if normal - sedation, bowl prep, must have someone drive you  Shared decision making and the patient had decided to do - she will wait and make a decision in 6 months.   Social History   Tobacco Use  Smoking Status Every Day   Packs/day: 1.00   Years: 44.00   Pack years: 44.00   Types: Cigarettes  Smokeless Tobacco Never  Tobacco Comments   recently quit 04/2018    Lung Cancer Screening (Ages 57-80): yes 20 year pack history? Yes Current Tobacco user? Yes Quit less than 15 years ago? Yes Interested in low dose CT for lung cancer screening? yes  Weight Wt Readings from Last 3 Encounters:  06/25/21 115 lb 8 oz (52.4 kg)  06/03/20 116 lb 8 oz (52.8 kg)  05/19/18 114 lb (51.7 kg)   Patient has normal BMI  BMI Readings from Last 1 Encounters:  06/25/21 22.94 kg/m     Chronic disease screening Blood pressure monitoring:  BP Readings from Last 3 Encounters:  06/25/21 140/88  06/03/20 140/90  05/19/18 120/80    Lipid Monitoring: Indication for screening: age >48, obesity, diabetes, family hx, CV risk factors.  Lipid screening: Yes  Lab Results  Component Value  Date   CHOL 151 12/05/2018   HDL 45.90 12/05/2018   LDLCALC 88 12/05/2018   LDLDIRECT 153.4 08/22/2013   TRIG 87.0 12/05/2018   CHOLHDL 3 12/05/2018     Diabetes Screening: age >50, overweight, family hx, PCOS, hx of gestational diabetes, at risk ethnicity Diabetes Screening screening: No  Lab Results  Component Value Date   HGBA1C 5.2 08/05/2017     Past Medical History:  Diagnosis Date   Anxiety    COPD (chronic obstructive pulmonary disease) ([place])    Depression    Fibroids    Uterine   HLD (hyperlipidemia)    OA (osteoarthritis)    Tobacco abuse    Vitiligo 1975   Wears  glasses     Past Surgical History:  Procedure Laterality Date   ABDOMINAL HYSTERECTOMY  2011   DILATION AND CURETTAGE OF UTERUS     HIP ARTHROSCOPY  2003   right    [place]   TOTAL HIP ARTHROPLASTY Right 12/27/2015   Procedure: RIGHT TOTAL HIP ARTHROPLASTY ANTERIOR APPROACH;  Surgeon: Mcarthur Rossetti, MD;  Location: [facility];  Service: Orthopedics;  Laterality: Right;   TUBAL LIGATION     URETER SURGERY  2011    Prior to Admission medications   Medication Sig Start Date End Date Taking? Authorizing Provider  albuterol (VENTOLIN HFA) 108 (90 Base) MCG/ACT inhaler Inhale 1-2 puffs into the lungs every 6 (six) hours as needed for wheezing or shortness of breath. 06/03/20  Yes Lesleigh Noe, MD  estradiol (VIVELLE-DOT) 0.05 MG/24HR patch Place 1 patch (0.05 mg total) onto the skin 2 (two) times a week. Office visit required for further refills. 06/16/21  Yes Pleas Koch, NP  loratadine (CLARITIN) 10 MG tablet Take 10 mg by mouth daily as needed for allergies.   Yes [provider]  buPROPion (WELLBUTRIN XL) 150 MG 24 hr tablet TAKE ONE TABLET BY MOUTH ONCE DAILY Patient not taking: Reported on 06/25/2021 03/03/18   Jearld Fenton, NP  etodolac (LODINE) 400 MG tablet Take 400 mg by mouth 2 (two) times daily. Patient not taking: Reported on 06/25/2021 05/19/20   [provider]  LORazepam (ATIVAN) 0.5 MG tablet TAKE ONE TABLET BY MOUTH EVERY 8 HOURS AS NEEDED Patient not taking: Reported on 06/25/2021 04/07/17   Lucille Passy, MD  rosuvastatin (CRESTOR) 20 MG tablet Take 20 mg by mouth daily. Patient not taking: Reported on 06/25/2021 05/09/20   [provider]    Allergies  Allergen Reactions   Codeine Nausea Only    Pt states when given codeine Zofran will not work for the nausea     Gynecologic History: No LMP recorded. Patient has had a hysterectomy.  Obstetric History: No obstetric history on file.  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[employer]  Tobacco Use   Smoking status: Every Day    Packs/day: 1.00    Years: 44.00    Pack years: 44.00    Types: Cigarettes   Smokeless tobacco: Never   Tobacco comments:    recently quit 04/2018  Substance and Sexual Activity   Alcohol use: No    Comment: history of alcoholism quit 16 years ago    Drug use: Yes    Comment: Marijuana (uses daily)  Sexual activity: Yes    Birth control/protection: Surgical  Other Topics Concern   Not on file  Social History Narrative   06/03/20   From: [facility] but in [place] since 1988   Living: with Roderic Palau (1991), husband   Work: on disability due to cervical and lumbar pain      Family: 2 daughters - Sharyn Lull and Bethena Roys and 1 son - Vonna Kotyk -- has 8 grandchildren      Enjoys: not sure, camping      Exercise: not currently due to chronic pain   Diet: some days good, other days not good - more junk food than real food      Safety   Seat belts: Yes    Guns: No   Safe in relationships: Yes    Social Determinants of Radio broadcast assistant Strain: Not on file  Food Insecurity: Not on file  Transportation Needs: Not on file  Physical Activity: Not on file  Stress: Not on file  Social Connections: Not on file  Intimate Partner Violence: Not on file    Family History  Problem Relation Age of Onset   Depression Mother    Miscarriages / [place] Sister     Review of Systems  Constitutional:  Negative for chills and fever.  HENT:  Negative for congestion and sore throat.   Eyes:  Negative for blurred vision and double vision.  Respiratory:  Negative for cough and shortness of breath.   Cardiovascular:  Positive for chest pain (rib location on the right side).  Gastrointestinal:  Negative for heartburn, nausea and vomiting.  Genitourinary:  Negative.   Musculoskeletal: Negative.  Negative for myalgias.  Skin:  Negative for rash.  Neurological:  Negative for dizziness and headaches.  Endo/Heme/Allergies:  Does not bruise/bleed easily.  Psychiatric/Behavioral:  Negative for depression. The patient is not nervous/anxious.     Physical Exam BP 140/88   Pulse 80   Temp (!) 97 F (36.1 C) (Temporal)   Ht 4' 11.5" (1.511 m)   Wt 115 lb 8 oz (52.4 kg)   SpO2 98%   BMI 22.94 kg/m    BP Readings from Last 3 Encounters:  06/25/21 140/88  06/03/20 140/90  05/19/18 120/80      Physical Exam Constitutional:      General: She is not in acute distress.    Appearance: She is well-developed. She is not diaphoretic.  HENT:     Head: Normocephalic and atraumatic.     Right Ear: External ear normal.     Left Ear: External ear normal.     Nose: Nose normal.  Eyes:     General: No scleral icterus.    Extraocular Movements: Extraocular movements intact.     Conjunctiva/sclera: Conjunctivae normal.  Cardiovascular:     Rate and Rhythm: Normal rate and regular rhythm.     Heart sounds: No murmur heard. Pulmonary:     Effort: Pulmonary effort is normal. No respiratory distress.     Breath sounds: Normal breath sounds. No wheezing.  Chest:     Comments: No skin changes. Generalized tenderness to the left ribcage and side area near the breast. No specific bony tenderness Abdominal:     General: Bowel sounds are normal. There is no distension.     Palpations: Abdomen is soft. There is no mass.     Tenderness: There is no abdominal tenderness. There is no guarding or rebound.  Musculoskeletal:        General: Normal range  of motion.     Cervical back: Neck supple.  Lymphadenopathy:     Cervical: No cervical adenopathy.  Skin:    General: Skin is warm and dry.     Capillary Refill: Capillary refill takes less than 2 seconds.  Neurological:     Mental Status: She is alert and oriented to person, place, and time.     Deep  Tendon Reflexes: Reflexes normal.  Psychiatric:        Mood and Affect: Mood normal.        Behavior: Behavior normal.    Results:  PHQ-9:  [place] Office Visit from 06/03/2020 in [place] [facility] [place]  PHQ-9 Total Score 7         Assessment: 60 y.o. No obstetric history on file. female here for routine annual physical examination.  Plan: Problem List Items Addressed This Visit       Other   HLD (hyperlipidemia)    Pt stopped crestor. Will reassess and address need.       Relevant Orders   Lipid panel   Tobacco use - Primary    Strongly advised lung cancer screening. Encouraged cessation      Relevant Orders   Ambulatory Referral for Lung Cancer Scre   Menopause    Decrease estrogen patch 0.05>0.025 twice weekly.       Left-sided chest pain    No bony tenderness, suspect muscle related. Encouraged lung cancer screening given smoking hx.       Other Visit Diagnoses     Annual physical exam       Elevated blood pressure reading       Relevant Orders   Comprehensive metabolic panel   Lipid panel   CBC       Screening: -- Blood pressure screen elevated: continued to monitor. -- cholesterol screening: will obtain -- Weight screening: normal -- Diabetes Screening: will obtain -- Nutrition: Encouraged healthy diet  The 10-year ASCVD risk score (Arnett DK, et al., 2019) is: 7.4%   Values used to calculate the score:     Age: 67 years     Sex: Female     Is Non-Hispanic African American: No     Diabetic: No     Tobacco smoker: Yes     Systolic Blood Pressure: 149 mmHg     Is BP treated: No     HDL Cholesterol: 45.9 mg/dL     Total Cholesterol: 151 mg/dL  -- Statin therapy for Age 39-75 with CVD risk >7.5%  Psych -- Depression screening (PHQ-9):  [place] Visit from 06/03/2020 in [place] at [facility]  PHQ-9 Total Score 7        Safety -- tobacco screening: using: discussed quitting using the 5  A's -- alcohol screening:  low-risk usage. -- no evidence of domestic violence or intimate partner violence.   Cancer Screening -- pap smear not collected per ASCCP guidelines -- family history of breast cancer screening: done. not at high risk. -- Mammogram -  every 2 years, up to date -- Colon cancer (age 30+)-- declined  Immunizations Immunization History  Administered Date(s) Administered   Influenza,inj,Quad PF,6+ Mos 08/23/2013, 09/16/2015, 09/22/2016, 07/19/2017   Pneumococcal Polysaccharide-23 07/19/2017   Td 02/06/2010    -- flu vaccine not up to date - declined -- TDAP q10 years not up to date - advised with pharmacy -- Shingles (age >76) not up to date - advised with pharmacy -- PPSV-23 (19-64 with chronic disease  or smoking) up to date -- Covid-19 Vaccine not up to date - declined   Encouraged healthy diet and exercise. Encouraged regular vision and dental care.    Lesleigh Noe, MD

## 2021-06-25 NOTE — Assessment & Plan Note (Signed)
No bony tenderness, suspect muscle related. Encouraged lung cancer screening given smoking hx.

## 2021-06-25 NOTE — Assessment & Plan Note (Signed)
Strongly advised lung cancer screening. Encouraged cessation

## 2021-08-22 ENCOUNTER — Other Ambulatory Visit: Payer: Self-pay | Admitting: Family Medicine

## 2021-10-02 ENCOUNTER — Encounter: Payer: Self-pay | Admitting: Family Medicine

## 2021-12-12 ENCOUNTER — Other Ambulatory Visit: Payer: Self-pay | Admitting: Family Medicine

## 2021-12-23 ENCOUNTER — Ambulatory Visit (INDEPENDENT_AMBULATORY_CARE_PROVIDER_SITE_OTHER): Payer: Medicare HMO | Admitting: Family Medicine

## 2021-12-23 ENCOUNTER — Ambulatory Visit: Payer: Medicare HMO | Admitting: Family Medicine

## 2021-12-23 ENCOUNTER — Other Ambulatory Visit: Payer: Self-pay

## 2021-12-23 VITALS — BP 138/92 | HR 68 | Temp 97.9°F | Ht 59.5 in | Wt 113.4 lb

## 2021-12-23 DIAGNOSIS — J449 Chronic obstructive pulmonary disease, unspecified: Secondary | ICD-10-CM | POA: Diagnosis not present

## 2021-12-23 DIAGNOSIS — N8189 Other female genital prolapse: Secondary | ICD-10-CM | POA: Diagnosis not present

## 2021-12-23 DIAGNOSIS — Z780 Asymptomatic menopausal state: Secondary | ICD-10-CM | POA: Diagnosis not present

## 2021-12-23 DIAGNOSIS — I10 Essential (primary) hypertension: Secondary | ICD-10-CM

## 2021-12-23 DIAGNOSIS — R030 Elevated blood-pressure reading, without diagnosis of hypertension: Secondary | ICD-10-CM | POA: Insufficient documentation

## 2021-12-23 MED ORDER — ESTRADIOL 0.025 MG/24HR TD PTTW: MEDICATED_PATCH | TRANSDERMAL | 0 refills | Status: DC

## 2021-12-23 MED ORDER — LISINOPRIL 10 MG PO TABS: 10.0000 mg | ORAL_TABLET | Freq: Every day | ORAL | 3 refills | Status: DC

## 2021-12-23 MED ORDER — PREDNISONE 20 MG PO TABS: 40.0000 mg | ORAL_TABLET | Freq: Every day | ORAL | 0 refills | Status: AC

## 2021-12-23 MED ORDER — AZITHROMYCIN 250 MG PO TABS: ORAL_TABLET | ORAL | 0 refills | Status: DC

## 2021-12-23 MED ORDER — BENZONATATE 100 MG PO CAPS: 100.0000 mg | ORAL_CAPSULE | Freq: Three times a day (TID) | ORAL | 0 refills | Status: DC | PRN

## 2021-12-23 NOTE — Assessment & Plan Note (Signed)
Blood pressure still elevated from prior.  Improved on repeat in the office.  Discussed starting lisinopril 10 mg sent to the pharmacy return in 3 weeks for recheck. ?

## 2021-12-23 NOTE — Assessment & Plan Note (Signed)
Does appear to have some prolapse on exam, status post hysterectomy.  At this time has some urinary incontinence in the setting of her cough.  And questionable retention in the morning.  Referral to OB/GYN for further evaluation and treatment discussion. ?

## 2021-12-23 NOTE — Assessment & Plan Note (Signed)
Patches were reduced at last office visit, she does notes worsening symptoms however in setting of elevated blood pressure discussed that at this point we should not increase.  We will reassess at next office visit ?

## 2021-12-23 NOTE — Assessment & Plan Note (Signed)
Rare use of albuterol, and patient notes overall symptoms seem to be improving.  Advised waiting 2 more days if no improvement she can take prednisone and azithromycin for possible exacerbation.  ?

## 2021-12-23 NOTE — Patient Instructions (Addendum)
Cough ?- tessalon perles ?- if you do not improve or get worse in the next 2 days start steroids and antibiotics ? ?Hypertension ?- start lisinopril 10 mg ?- return in 3 weeks for blood pressure check and labs ? ?OB/GYN referral for prolapse ? ?Based on your symptoms, it looks like you have a virus.  ? ?Antibiotics are not need for a viral infection but the following will help:  ? ?Drink plenty of fluids ?Get lots of rest ? ?Sinus Congestion ?1) Neti Pot (Saline rinse) -- 2 times day -- if tolerated ?2) Flonase (Store Brand ok) - once daily ?3) Over the counter congestion medications ? ?Cough ?1) Cough drops can be helpful ?2) Nyquil (or nighttime cough medication) ?3) Honey is proven to be one of the best cough medications  ?4) Cough medicine with Dextromethorphan can also be helpful ? ?Sore Throat ?1) Honey as above, cough drops ?2) Ibuprofen or Aleve can be helpful ?3) Salt water Gargles ? ?If you develop fevers (Temperature >100.4), chills, worsening symptoms or symptoms lasting longer than 10 days return to clinic.   ? ?

## 2021-12-23 NOTE — Progress Notes (Signed)
? ?Subjective:  ? ?  ?[name] [name] is a 61 y.o. female presenting for Cough (Productive, yellow mucous x 5 days ), Nasal Congestion, and Vaginal Prolapse (X last few months ) ?  ? ? ?Cough ?This is a new problem. The current episode started in the past 7 days. The problem has been gradually improving. The cough is Productive of sputum. Associated symptoms include headaches, nasal congestion, shortness of breath and wheezing. Pertinent negatives include no chest pain or sore throat. Treatments tried: mucinex, robutussin, nyquil, dayquil. The treatment provided mild relief. Her past medical history is significant for COPD.  ? ?Has used her albuterol one time ? ?#vaginal symptoms ?- feels like something is there when wiping ?- also getting some abdominal pressure ?- no incontinence ?- with coughing and laughing does have leakage ?- no urgency ?- no frequency ?- trouble emptying the bladder - mostly in the mornings ? ?#HTN ?- elevated bp ?- does not check at home ?- has HA associated with cold ?- endorses occasional CP ?- taking advil/aleve every few days ? ? ?#Estrogen ?- endorses more hot flashes ?- menopausal symptoms ? ?Review of Systems  ?HENT:  Negative for sore throat.   ?Respiratory:  Positive for cough, shortness of breath and wheezing.   ?Cardiovascular:  Negative for chest pain.  ?Neurological:  Positive for headaches.  ? ? ?Social History  ? ?Tobacco Use  ?Smoking Status Every Day  ? Packs/day: 1.00  ? Years: 44.00  ? Pack years: 44.00  ? Types: Cigarettes  ?Smokeless Tobacco Never  ?Tobacco Comments  ? recently quit 04/2018  ? ? ? ?   ?Objective:  ?  ?BP Readings from Last 3 Encounters:  ?12/23/21 (!) 138/92  ?06/25/21 140/88  ?06/03/20 140/90  ? ?Wt Readings from Last 3 Encounters:  ?12/23/21 113 lb 6 oz (51.4 kg)  ?06/25/21 115 lb 8 oz (52.4 kg)  ?06/03/20 116 lb 8 oz (52.8 kg)  ? ? ?BP (!) 138/92   Pulse 68   Temp 97.9 ?F (36.6 ?C) (Oral)   Ht 4' 11.5" (1.511 m)   Wt 113 lb 6 oz (51.4 kg)   SpO2  99%   BMI 22.52 kg/m?  ? ? ?Physical Exam ?Exam conducted with a chaperone present.  ?Constitutional:   ?   General: She is not in acute distress. ?   Appearance: She is well-developed. She is not diaphoretic.  ?HENT:  ?   Head: Normocephalic and atraumatic.  ?   Right Ear: Tympanic membrane and ear canal normal.  ?   Left Ear: Tympanic membrane and ear canal normal.  ?   Nose: Mucosal edema and rhinorrhea present.  ?   Right Sinus: No maxillary sinus tenderness or frontal sinus tenderness.  ?   Left Sinus: No maxillary sinus tenderness or frontal sinus tenderness.  ?   Mouth/Throat:  ?   Pharynx: Uvula midline. Posterior oropharyngeal erythema present. No oropharyngeal exudate.  ?   Tonsils: 0 on the right. 0 on the left.  ?Eyes:  ?   General: No scleral icterus. ?   Conjunctiva/sclera: Conjunctivae normal.  ?Cardiovascular:  ?   Rate and Rhythm: Normal rate and regular rhythm.  ?   Heart sounds: Normal heart sounds. No murmur heard. ?Pulmonary:  ?   Effort: Pulmonary effort is normal. No respiratory distress.  ?   Breath sounds: Wheezing (scattered) present.  ?Genitourinary: ?   Comments: Prolapse noted with cough ?Also some anterior prolapse with exam ?Musculoskeletal:  ?  Cervical back: Neck supple.  ?Lymphadenopathy:  ?   Cervical: No cervical adenopathy.  ?Skin: ?   General: Skin is warm and dry.  ?   Capillary Refill: Capillary refill takes less than 2 seconds.  ?Neurological:  ?   Mental Status: She is alert.  ? ? ? ? ? ?   ?Assessment & Plan:  ? ?Problem List Items Addressed This Visit   ? ?  ? Cardiovascular and Mediastinum  ? Primary hypertension  ?  Blood pressure still elevated from prior.  Improved on repeat in the office.  Discussed starting lisinopril 10 mg sent to the pharmacy return in 3 weeks for recheck. ?  ?  ? Relevant Medications  ? lisinopril (ZESTRIL) 10 MG tablet  ?  ? Respiratory  ? COPD (chronic obstructive pulmonary disease) ([place]) - Primary  ?  Rare use of albuterol, and patient notes  overall symptoms seem to be improving.  Advised waiting 2 more days if no improvement she can take prednisone and azithromycin for possible exacerbation.  ?  ?  ? Relevant Medications  ? predniSONE (DELTASONE) 20 MG tablet  ? azithromycin (ZITHROMAX) 250 MG tablet  ? benzonatate (TESSALON PERLES) 100 MG capsule  ?  ? Other  ? Menopause  ?  Patches were reduced at last office visit, she does notes worsening symptoms however in setting of elevated blood pressure discussed that at this point we should not increase.  We will reassess at next office visit ?  ?  ? Pelvic floor weakness in female  ?  Does appear to have some prolapse on exam, status post hysterectomy.  At this time has some urinary incontinence in the setting of her cough.  And questionable retention in the morning.  Referral to OB/GYN for further evaluation and treatment discussion. ?  ?  ? Relevant Orders  ? Ambulatory referral to Obstetrics / Gynecology  ? ? ? ?Return in about 3 weeks (around 01/13/2022) for blood pressure check and back pain. ? ?Lesleigh Noe, MD ? ?This visit occurred during the SARS-CoV-2 public health emergency.  Safety protocols were in place, including screening questions prior to the visit, additional usage of staff PPE, and extensive cleaning of exam room while observing appropriate contact time as indicated for disinfecting solutions.  ? ?

## 2021-12-24 ENCOUNTER — Telehealth: Payer: Self-pay | Admitting: Family Medicine

## 2021-12-24 NOTE — Telephone Encounter (Signed)
Pt called stating that the BP medication that she started yesterday has made her BP go from high  to low. Pt is asking if she should continue the medication or cut it in half. Please advise. ?

## 2021-12-24 NOTE — Telephone Encounter (Signed)
Spoke to pt and relayed message.

## 2021-12-24 NOTE — Telephone Encounter (Signed)
Can try 1/2 dose. Please also update with readings in 1 week ?

## 2021-12-26 ENCOUNTER — Encounter: Payer: Self-pay | Admitting: Family Medicine

## 2022-01-01 NOTE — Addendum Note (Signed)
Addended by: Waunita Schooner R on: 01/01/2022 01:09 PM ? ? Modules accepted: Orders ? ?

## 2022-01-05 ENCOUNTER — Telehealth: Payer: Self-pay

## 2022-01-05 DIAGNOSIS — Z780 Asymptomatic menopausal state: Secondary | ICD-10-CM

## 2022-01-05 DIAGNOSIS — E7800 Pure hypercholesterolemia, unspecified: Secondary | ICD-10-CM

## 2022-01-05 NOTE — Telephone Encounter (Signed)
She stopped the medication due to not needed.  ? ?She was advised to f/u in the office to address back pain which we didn't have time to discuss.  ? ?If she is still having back pain I recommend OV to discuss. No need for labs as not on medication. ? ?Also, ask if she is willing to take cholesterol medication as advised in the fall.  ?

## 2022-01-05 NOTE — Telephone Encounter (Signed)
Terri from lab is asking if pt needs a lab only appt, per pt's last AVS, or is pt suppose to have an OV?  ?Please advise, as pt only has a lab appt, at this point but there are no labs ordered.  ? ?

## 2022-01-05 NOTE — Telephone Encounter (Signed)
Mychart sent to pt.

## 2022-01-08 ENCOUNTER — Encounter: Payer: Self-pay | Admitting: Family Medicine

## 2022-01-08 MED ORDER — ATORVASTATIN CALCIUM 10 MG PO TABS: 10.0000 mg | ORAL_TABLET | Freq: Every day | ORAL | 3 refills | Status: DC

## 2022-01-08 MED ORDER — ESTRADIOL 0.0375 MG/24HR TD PTTW: 1.0000 | MEDICATED_PATCH | TRANSDERMAL | 1 refills | Status: DC

## 2022-01-14 ENCOUNTER — Other Ambulatory Visit: Payer: Medicare HMO

## 2022-02-16 ENCOUNTER — Telehealth: Payer: Self-pay | Admitting: Family Medicine

## 2022-02-16 NOTE — Telephone Encounter (Signed)
LVM for pt to rtn my call to schedule AWV with [facility]. Please schedule AWV if pt calls the office  

## 2022-02-28 ENCOUNTER — Other Ambulatory Visit: Payer: Self-pay | Admitting: Family Medicine

## 2022-02-28 DIAGNOSIS — Z780 Asymptomatic menopausal state: Secondary | ICD-10-CM

## 2022-03-03 NOTE — Telephone Encounter (Signed)
Patients needs an appointment  Her dose was decreased previously and her BP was high at our last visit

## 2022-03-04 ENCOUNTER — Other Ambulatory Visit: Payer: Self-pay | Admitting: Family Medicine

## 2022-03-04 DIAGNOSIS — Z780 Asymptomatic menopausal state: Secondary | ICD-10-CM

## 2022-03-11 NOTE — Telephone Encounter (Signed)
LVM for patient to call back and get scheduled.

## 2022-03-18 ENCOUNTER — Encounter: Payer: Self-pay | Admitting: Family Medicine

## 2022-03-18 ENCOUNTER — Ambulatory Visit: Payer: Medicare HMO | Admitting: Family Medicine

## 2022-03-18 VITALS — BP 120/74 | HR 69 | Wt 113.0 lb

## 2022-03-18 DIAGNOSIS — N8111 Cystocele, midline: Secondary | ICD-10-CM

## 2022-03-18 NOTE — Progress Notes (Signed)
   Subjective:    Patient ID: [name] [name] is a 61 y.o. female presenting with No chief complaint on file.  on 03/18/2022  HPI: Feels like something is falling out. Gets worse after sex and if coughing gets worse. Does not have any issues with her bladder. Has some difficulty with voiding and possibly with defecation.  Review of Systems  Constitutional:  Negative for chills and fever.  Respiratory:  Negative for shortness of breath.   Cardiovascular:  Negative for chest pain.  Gastrointestinal:  Negative for abdominal pain, nausea and vomiting.  Genitourinary:  Negative for dysuria.  Skin:  Negative for rash.     Objective:    BP 120/74   Pulse 69   Wt 113 lb (51.3 kg)   BMI 22.44 kg/m  Physical Exam Exam conducted with a chaperone present.  Constitutional:      General: She is not in acute distress.    Appearance: She is well-developed.  HENT:     Head: Normocephalic and atraumatic.  Eyes:     General: No scleral icterus. Cardiovascular:     Rate and Rhythm: Normal rate.  Pulmonary:     Effort: Pulmonary effort is normal.  Abdominal:     Palpations: Abdomen is soft.  Genitourinary:    Vagina: Normal.     Uterus: Absent.      Comments: Mild cystocele with good apical and posterior support. Musculoskeletal:     Cervical back: Neck supple.  Skin:    General: Skin is warm and dry.  Neurological:     Mental Status: She is alert and oriented to person, place, and time.        Assessment & Plan:   Problem List Items Addressed This Visit       Unprioritized   Cystocele, midline - Primary    Offered options of surgery, pessary and kegels. She opts to try kegels and will let us know if wants an alternative or if gets worse. Shown images of the problem.        Return if symptoms worsen or fail to improve.  Donnamae Jude, MD 03/18/2022 1:27 PM

## 2022-03-18 NOTE — Progress Notes (Signed)
Patient presents for problem visit to discuss Pelvic floor weakness.   Last Mammogram: 01/22/2020  CC: pt states she feels as if something is hanging or as if something is going to fall out/pressure . May have leaking with coughing.

## 2022-03-18 NOTE — Assessment & Plan Note (Signed)
Offered options of surgery, pessary and kegels. She opts to try kegels and will let us know if wants an alternative or if gets worse. Shown images of the problem.

## 2022-03-19 ENCOUNTER — Other Ambulatory Visit: Payer: Self-pay | Admitting: Family Medicine

## 2022-03-19 ENCOUNTER — Ambulatory Visit (INDEPENDENT_AMBULATORY_CARE_PROVIDER_SITE_OTHER): Payer: Medicare HMO | Admitting: Family Medicine

## 2022-03-19 VITALS — BP 120/78 | HR 73 | Temp 97.6°F | Ht 59.5 in | Wt 114.4 lb

## 2022-03-19 DIAGNOSIS — M546 Pain in thoracic spine: Secondary | ICD-10-CM | POA: Diagnosis not present

## 2022-03-19 DIAGNOSIS — I10 Essential (primary) hypertension: Secondary | ICD-10-CM | POA: Diagnosis not present

## 2022-03-19 DIAGNOSIS — G8929 Other chronic pain: Secondary | ICD-10-CM | POA: Insufficient documentation

## 2022-03-19 DIAGNOSIS — Z780 Asymptomatic menopausal state: Secondary | ICD-10-CM

## 2022-03-19 DIAGNOSIS — Z720 Tobacco use: Secondary | ICD-10-CM | POA: Diagnosis not present

## 2022-03-19 MED ORDER — CYCLOBENZAPRINE HCL 5 MG PO TABS: 5.0000 mg | ORAL_TABLET | Freq: Three times a day (TID) | ORAL | 0 refills | Status: DC | PRN

## 2022-03-19 MED ORDER — ESTRADIOL 0.025 MG/24HR TD PTTW: 1.0000 | MEDICATED_PATCH | TRANSDERMAL | 0 refills | Status: DC

## 2022-03-19 MED ORDER — MELOXICAM 7.5 MG PO TABS: 7.5000 mg | ORAL_TABLET | Freq: Every day | ORAL | 0 refills | Status: DC | PRN

## 2022-03-19 NOTE — Patient Instructions (Signed)
Decrease estrogen patch - can take up to 3 times per week - if severe symptoms immediately let me know  We can try: Lexapro, Effexor, or Fluoxetine to help with symptoms  Your blood pressure high.   Bring your home readings to your next office visit  High blood pressure increases your risk for heart attack and stroke.    Please check your blood pressure 2-4 times a week.   To check your blood pressure 1) Sit in a quiet and relaxed place for 5 minutes 2) Make sure your feet are flat on the ground 3) Consider checking first thing in the morning   Normal blood pressure is less than 140/90 Ideally you blood pressure should be around 120/80  Other ways you can reduce your blood pressure:  1) Regular exercise -- Try to get 150 minutes (30 minutes, 5 days a week) of moderate to vigorous aerobic excercise -- Examples: brisk walking (2.5 miles per hour), water aerobics, dancing, gardening, tennis, biking slower than 10 miles per hour 2) DASH Diet - low fat meats, more fresh fruits and vegetables, whole grains, low salt 3) Quit smoking if you smoke 4) Loose 5-10% of your body weight

## 2022-03-19 NOTE — Assessment & Plan Note (Signed)
Improved on repeat. Component of whitecoat HTN but did have some elevated at home. Cont home monitoring. Return 1 month

## 2022-03-19 NOTE — Assessment & Plan Note (Signed)
Responds to intermittent meloxicam and flexeril.

## 2022-03-19 NOTE — Assessment & Plan Note (Signed)
>  10 years of estrogen supplement and current smoker. Discussed high risk for stroke and intermittent elevated BP is concerned. Advised plan to stop estrogen over the next 6 months. Previously did not tolerate reduced dose. Discussed decreasing to 0.025 with option to take up to 3 times per week. If not tolerated advised with consider SSRI to help with hot flashes and irritability. Will discuss at next visit if not tolerating wean.

## 2022-03-19 NOTE — Progress Notes (Signed)
Subjective:     [name] [name] is a 61 y.o. female presenting for Medication Refill and Back Pain (Requesting rx for pain and tightness )     HPI  #Hypertension - is not check at home - was low at the OB/GYN office - Readings at home   #estrogen - has been on estrogen since 2012 - since having her hysterectomy -   #Chronic back pain - has old prescriptions for meloxicam, hydrocodone - going to [facility] for PT - since her hip  - would like something for muscle tightness - going for dry needling - thoracic -    Review of Systems   Social History   Tobacco Use  Smoking Status Every Day   Packs/day: 1.00   Years: 44.00   Total pack years: 44.00   Types: Cigarettes  Smokeless Tobacco Never  Tobacco Comments   recently quit 04/2018        Objective:    BP Readings from Last 3 Encounters:  03/19/22 120/78  03/18/22 120/74  12/23/21 (!) 138/92   Wt Readings from Last 3 Encounters:  03/19/22 114 lb 6 oz (51.9 kg)  03/18/22 113 lb (51.3 kg)  12/23/21 113 lb 6 oz (51.4 kg)    BP 120/78   Pulse 73   Temp 97.6 F (36.4 C) (Temporal)   Ht 4' 11.5" (1.511 m)   Wt 114 lb 6 oz (51.9 kg)   SpO2 99%   BMI 22.71 kg/m    Physical Exam Constitutional:      General: She is not in acute distress.    Appearance: She is well-developed. She is not diaphoretic.  HENT:     Right Ear: External ear normal.     Left Ear: External ear normal.     Nose: Nose normal.  Eyes:     Conjunctiva/sclera: Conjunctivae normal.  Cardiovascular:     Rate and Rhythm: Normal rate and regular rhythm.     Heart sounds: No murmur heard. Pulmonary:     Effort: Pulmonary effort is normal. No respiratory distress.     Breath sounds: Normal breath sounds. No wheezing.  Musculoskeletal:     Cervical back: Neck supple.  Skin:    General: Skin is warm and dry.     Capillary Refill: Capillary refill takes less than 2 seconds.  Neurological:     Mental Status: She is alert. Mental  status is at baseline.  Psychiatric:        Mood and Affect: Mood normal.        Behavior: Behavior normal.           Assessment & Plan:   Problem List Items Addressed This Visit       Cardiovascular and Mediastinum   Primary hypertension    Improved on repeat. Component of whitecoat HTN but did have some elevated at home. Cont home monitoring. Return 1 month        Other   Tobacco use    Discussed increase CV risk with estrogen. Not interested in quitting      Menopause    >10 years of estrogen supplement and current smoker. Discussed high risk for stroke and intermittent elevated BP is concerned. Advised plan to stop estrogen over the next 6 months. Previously did not tolerate reduced dose. Discussed decreasing to 0.025 with option to take up to 3 times per week. If not tolerated advised with consider SSRI to help with hot flashes and irritability. Will discuss at next visit  if not tolerating wean.       Relevant Medications   estradiol (VIVELLE-DOT) 0.025 MG/24HR (Start on 03/20/2022)   Chronic bilateral thoracic back pain - Primary    Responds to intermittent meloxicam and flexeril.       Relevant Medications   cyclobenzaprine (FLEXERIL) 5 MG tablet   meloxicam (MOBIC) 7.5 MG tablet     Return in about 4 weeks (around 04/16/2022) for Menopause.  Lesleigh Noe, MD

## 2022-03-19 NOTE — Assessment & Plan Note (Signed)
Discussed increase CV risk with estrogen. Not interested in quitting

## 2022-03-20 NOTE — Telephone Encounter (Signed)
Prior auth started for Estradiol 0.'025MG'$ /24HR biweekly patches. Delice Bison (Key: [id number]) Waiting for determination.

## 2022-03-23 NOTE — Telephone Encounter (Signed)
Prior auth for Estradiol 0.'025MG'$ /24HR biweekly patches has been approved.  As a member of [employer] ([employer]), we are pleased to inform you that, upon review of the information provided by you or your doctor, we have approved the requested coverage for the following prescription drug:  ESTRADIOL Patch TW Type of coverage approved:  Quantity Limits  This approval authorizes your coverage from 10/12/2021 - 10/11/2022, unless we notify you otherwise, and as long as the following conditions apply:  -you remain enrolled in our Medicare Part D prescription drug plan, -your physician or other prescriber continues to prescribe the medication for you, and  -the medication continues to be safe for treating your condition.  This approval for ESTRADIOL 0.025 MG/24 HR Patch TW, authorizes you to receive a quantity up to 8 every 16 days, as prescribed by your physician.

## 2022-03-30 ENCOUNTER — Other Ambulatory Visit: Payer: Self-pay | Admitting: Family Medicine

## 2022-03-30 DIAGNOSIS — Z780 Asymptomatic menopausal state: Secondary | ICD-10-CM

## 2022-03-30 MED ORDER — ESTRADIOL 0.025 MG/24HR TD PTTW: 1.0000 | MEDICATED_PATCH | TRANSDERMAL | 0 refills | Status: DC

## 2022-03-30 NOTE — Addendum Note (Signed)
Addended by: Carter Kitten on: 03/30/2022 04:52 PM   Modules accepted: Orders

## 2022-04-02 ENCOUNTER — Other Ambulatory Visit: Payer: Self-pay | Admitting: Family Medicine

## 2022-04-02 DIAGNOSIS — Z780 Asymptomatic menopausal state: Secondary | ICD-10-CM

## 2022-04-24 ENCOUNTER — Ambulatory Visit (INDEPENDENT_AMBULATORY_CARE_PROVIDER_SITE_OTHER): Payer: Medicare HMO | Admitting: Family Medicine

## 2022-04-24 VITALS — BP 120/58 | HR 65 | Temp 97.6°F | Ht 59.5 in | Wt 114.5 lb

## 2022-04-24 DIAGNOSIS — Z780 Asymptomatic menopausal state: Secondary | ICD-10-CM | POA: Diagnosis not present

## 2022-04-24 DIAGNOSIS — F419 Anxiety disorder, unspecified: Secondary | ICD-10-CM | POA: Diagnosis not present

## 2022-04-24 DIAGNOSIS — F32A Depression, unspecified: Secondary | ICD-10-CM

## 2022-04-24 DIAGNOSIS — F330 Major depressive disorder, recurrent, mild: Secondary | ICD-10-CM | POA: Diagnosis not present

## 2022-04-24 DIAGNOSIS — R69 Illness, unspecified: Secondary | ICD-10-CM | POA: Diagnosis not present

## 2022-04-24 MED ORDER — FLUOXETINE HCL 20 MG PO TABS: ORAL_TABLET | ORAL | 0 refills | Status: DC

## 2022-04-24 NOTE — Progress Notes (Signed)
Subjective:     [name] [name] is a 61 y.o. female presenting for Follow-up (Menopause and BP)     HPI   #Elevated blood pressure - is noticing higher numbers in the morning - has had some low 96/61 - does endorse some lightheadedness  #Hot flashes - has been trying to decrease the patch - is on the patch of 0.025 mg - getting more hot flashes on day 3-4, waiting 5 days    Review of Systems   Social History   Tobacco Use  Smoking Status Every Day   Packs/day: 1.00   Years: 44.00   Total pack years: 44.00   Types: Cigarettes  Smokeless Tobacco Never  Tobacco Comments   recently quit 04/2018        Objective:    BP Readings from Last 3 Encounters:  04/24/22 (!) 120/58  03/19/22 120/78  03/18/22 120/74   Wt Readings from Last 3 Encounters:  04/24/22 114 lb 8 oz (51.9 kg)  03/19/22 114 lb 6 oz (51.9 kg)  03/18/22 113 lb (51.3 kg)    BP (!) 120/58   Pulse 65   Temp 97.6 F (36.4 C) (Temporal)   Ht 4' 11.5" (1.511 m)   Wt 114 lb 8 oz (51.9 kg)   SpO2 98%   BMI 22.74 kg/m    Physical Exam Constitutional:      General: She is not in acute distress.    Appearance: She is well-developed. She is not diaphoretic.  HENT:     Right Ear: External ear normal.     Left Ear: External ear normal.     Nose: Nose normal.  Eyes:     Conjunctiva/sclera: Conjunctivae normal.  Cardiovascular:     Rate and Rhythm: Normal rate and regular rhythm.  Pulmonary:     Effort: Pulmonary effort is normal. No respiratory distress.     Breath sounds: Normal breath sounds. No wheezing.  Musculoskeletal:     Cervical back: Neck supple.  Skin:    General: Skin is warm and dry.     Capillary Refill: Capillary refill takes less than 2 seconds.  Neurological:     Mental Status: She is alert. Mental status is at baseline.  Psychiatric:        Mood and Affect: Mood normal.        Behavior: Behavior normal.       04/24/2022   11:36 AM 06/03/2020    3:34 PM 05/19/2018     9:35 AM  Depression screen PHQ 2/9  Decreased Interest 0 3 0  Down, Depressed, Hopeless 1 1 0  PHQ - 2 Score 1 4 0  Altered sleeping 1 0 0  Tired, decreased energy 2 2 0  Change in appetite 1 1 0  Feeling bad or failure about yourself  1 0 0  Trouble concentrating 1 0 0  Moving slowly or fidgety/restless 0 0 0  Suicidal thoughts 1 0 0  PHQ-9 Score 8 7 0  Difficult doing work/chores Very difficult Somewhat difficult Not difficult at all          Assessment & Plan:   Problem List Items Addressed This Visit       Other   Anxiety and depression   Relevant Medications   FLUoxetine (PROZAC) 20 MG tablet   Major depressive disorder, recurrent ([facility])    Complicated by stopping her estrogen resulting in more irritability.  Given persistence of symptoms discussed starting fluoxetine 20 mg with plan to  increase to 40 mg as tolerated.  Follow-up in 6 weeks.  Discussed risk of suicidal ideation and information provided if this occurs      Relevant Medications   FLUoxetine (PROZAC) 20 MG tablet   Menopause - Primary    She is working to cut back on estrogen, we will start fluoxetine to help with the transition as well as her depression.      Relevant Medications   FLUoxetine (PROZAC) 20 MG tablet     Return in about 6 weeks (around 06/05/2022) for depression .  Lesleigh Noe, MD

## 2022-04-24 NOTE — Assessment & Plan Note (Signed)
She is working to cut back on estrogen, we will start fluoxetine to help with the transition as well as her depression.

## 2022-04-24 NOTE — Patient Instructions (Signed)
Continue to slowly taper the estrogen  You are going to start a new antidepressant medication.   One of the risks of this medication is increase in suicidal thoughts.   Your suicide Action plan is as follows:  1) Call daughter 2) Call the Suicide Hotline [phone] which is available 24 hours 3) Call the Clinic    The most common side effect is stomach upset. If this happens it means the medication is working. It should get better in 1-3 weeks.   Medication for depression and anxiety often takes 6-8 weeks to have a noticeable difference so stick with it. Also the best way for recovery is taking medication and seeing a therapist -- this is so important.    How to help anxiety and depression  1) Regular Exercise - walking, jogging, cycling, dancing, strength training - aiming for 150 minutes of exercise a week --> Yoga has been shown in research to reduce depression and anxiety -- with even just one hour long session per week  2)  Begin a Mindfulness/Meditation practice -- this can take a little as 3 minutes and is helpful for all kinds of mood issues -- You can find resources in books -- Or you can download apps like  ---- Headspace App  ---- Calm  ---- Insignt Timer ---- Stop, Breathe & Think  # With each of these Apps - you should decline the "start free trial" offer and as you search through the App should be able to access some of their free content. You can also chose to pay for the content if you find one that works well for you.   # Many of them also offer sleep specific content which may help with insomnia  3) Healthy Diet -- Avoid or decrease Caffeine -- Avoid or decrease Alcohol -- Drink plenty of water, have a balanced diet -- Avoid cigarettes and marijuana (as well as other recreational drugs)  4) Find a therapist  -- [facility] is one option. Call [phone] -- Or you can check out www.[email or web address] -- you can read bios of therapists  and see if they accept insurance -- Check with your insurance to see if you have coverage and who may take your insurance

## 2022-04-24 NOTE — Assessment & Plan Note (Addendum)
Complicated by stopping her estrogen resulting in more irritability.  Given persistence of symptoms discussed starting fluoxetine 20 mg with plan to increase to 40 mg as tolerated.  Follow-up in 6 weeks.  Discussed risk of suicidal ideation and information provided if this occurs

## 2022-05-16 ENCOUNTER — Other Ambulatory Visit: Payer: Self-pay | Admitting: Family Medicine

## 2022-05-16 DIAGNOSIS — Z780 Asymptomatic menopausal state: Secondary | ICD-10-CM

## 2022-05-16 DIAGNOSIS — F32A Depression, unspecified: Secondary | ICD-10-CM

## 2022-06-10 ENCOUNTER — Encounter: Payer: Self-pay | Admitting: Family Medicine

## 2022-06-10 ENCOUNTER — Ambulatory Visit (INDEPENDENT_AMBULATORY_CARE_PROVIDER_SITE_OTHER): Payer: Medicare HMO | Admitting: Family Medicine

## 2022-06-10 VITALS — BP 122/62 | HR 70 | Temp 97.5°F | Ht 59.5 in | Wt 112.1 lb

## 2022-06-10 DIAGNOSIS — R030 Elevated blood-pressure reading, without diagnosis of hypertension: Secondary | ICD-10-CM | POA: Diagnosis not present

## 2022-06-10 DIAGNOSIS — Z720 Tobacco use: Secondary | ICD-10-CM | POA: Diagnosis not present

## 2022-06-10 DIAGNOSIS — F32A Depression, unspecified: Secondary | ICD-10-CM

## 2022-06-10 DIAGNOSIS — E7800 Pure hypercholesterolemia, unspecified: Secondary | ICD-10-CM | POA: Diagnosis not present

## 2022-06-10 DIAGNOSIS — F419 Anxiety disorder, unspecified: Secondary | ICD-10-CM

## 2022-06-10 DIAGNOSIS — R69 Illness, unspecified: Secondary | ICD-10-CM | POA: Diagnosis not present

## 2022-06-10 DIAGNOSIS — Z780 Asymptomatic menopausal state: Secondary | ICD-10-CM | POA: Diagnosis not present

## 2022-06-10 DIAGNOSIS — D582 Other hemoglobinopathies: Secondary | ICD-10-CM | POA: Insufficient documentation

## 2022-06-10 LAB — COMPREHENSIVE METABOLIC PANEL
ALT: 9 U/L (ref 0–35)
AST: 13 U/L (ref 0–37)
Albumin: 4.2 g/dL (ref 3.5–5.2)
Alkaline Phosphatase: 58 U/L (ref 39–117)
BUN: 10 mg/dL (ref 6–23)
CO2: 30 mEq/L (ref 19–32)
Calcium: 9.5 mg/dL (ref 8.4–10.5)
Chloride: 105 mEq/L (ref 96–112)
Creatinine, Ser: 1.09 mg/dL (ref 0.40–1.20)
GFR: 55.03 mL/min — ABNORMAL LOW (ref >60.00)
Glucose, Bld: 62 mg/dL — ABNORMAL LOW (ref 70–99)
Potassium: 4.4 mEq/L (ref 3.5–5.1)
Sodium: 141 mEq/L (ref 135–145)
Total Bilirubin: 0.5 mg/dL (ref 0.2–1.2)
Total Protein: 6.4 g/dL (ref 6.0–8.3)

## 2022-06-10 LAB — LIPID PANEL
Cholesterol: 261 mg/dL — ABNORMAL HIGH (ref 0–200)
HDL: 53 mg/dL (ref >39.00)
LDL Cholesterol: 180 mg/dL — ABNORMAL HIGH (ref 0–99)
NonHDL: 208.36
Total CHOL/HDL Ratio: 5
Triglycerides: 140 mg/dL (ref 0.0–149.0)
VLDL: 28 mg/dL (ref 0.0–40.0)

## 2022-06-10 LAB — CBC
HCT: 47.4 % — ABNORMAL HIGH (ref 36.0–46.0)
Hemoglobin: 15.8 g/dL — ABNORMAL HIGH (ref 12.0–15.0)
MCHC: 33.3 g/dL (ref 30.0–36.0)
MCV: 91.4 fl (ref 78.0–100.0)
Platelets: 172 10*3/uL (ref 150.0–400.0)
RBC: 5.19 Mil/uL — ABNORMAL HIGH (ref 3.87–5.11)
RDW: 13.9 % (ref 11.5–15.5)
WBC: 6.1 10*3/uL (ref 4.0–10.5)

## 2022-06-10 NOTE — Assessment & Plan Note (Signed)
Likely 2/2 to copd and tobacco use. Encourage cessation. Recheck labs

## 2022-06-10 NOTE — Assessment & Plan Note (Signed)
Difficulty adhering to medication. Recheck today. Cont lipitor 10 mg - she is hoping her sister will help motivate her.

## 2022-06-10 NOTE — Assessment & Plan Note (Signed)
Quit before for 6 months. Not ready to quit at this time.

## 2022-06-10 NOTE — Assessment & Plan Note (Signed)
Slowly decreasing estrogen. Using 0.025 mg patches every 3-4 days. Slowly space out. Discussed she will have hot flashes/mood changes but should be short lived once she stops completely.

## 2022-06-10 NOTE — Assessment & Plan Note (Signed)
Improved and stable off medication. Return if worsening

## 2022-06-10 NOTE — Progress Notes (Signed)
Subjective:     [name] [name] is a 61 y.o. female presenting for Follow-up (6 week- depression)     HPI  #Adjustment reaction - was dealing with a lot of irritability - doing ok - mood is doing well   #estrogen - using the patch every 3-4 days - slowly trying to decrease amount and frequency   Review of Systems  04/24/2022: Clinic - depression/menopause - start fluoxetine 20 mg  Social History   Tobacco Use  Smoking Status Every Day   Packs/day: 1.00   Years: 44.00   Total pack years: 44.00   Types: Cigarettes  Smokeless Tobacco Never  Tobacco Comments   recently quit 04/2018        Objective:    BP Readings from Last 3 Encounters:  06/10/22 122/62  04/24/22 (!) 120/58  03/19/22 120/78   Wt Readings from Last 3 Encounters:  06/10/22 112 lb 2 oz (50.9 kg)  04/24/22 114 lb 8 oz (51.9 kg)  03/19/22 114 lb 6 oz (51.9 kg)    BP 122/62   Pulse 70   Temp (!) 97.5 F (36.4 C) (Temporal)   Ht 4' 11.5" (1.511 m)   Wt 112 lb 2 oz (50.9 kg)   SpO2 98%   BMI 22.27 kg/m    Physical Exam Constitutional:      General: She is not in acute distress.    Appearance: She is well-developed. She is not diaphoretic.  HENT:     Head: Normocephalic and atraumatic.     Right Ear: External ear normal.     Left Ear: External ear normal.     Nose: Nose normal.  Eyes:     General: No scleral icterus.    Extraocular Movements: Extraocular movements intact.     Conjunctiva/sclera: Conjunctivae normal.  Cardiovascular:     Rate and Rhythm: Normal rate and regular rhythm.     Heart sounds: No murmur heard. Pulmonary:     Effort: Pulmonary effort is normal. No respiratory distress.     Breath sounds: Normal breath sounds. No wheezing.  Abdominal:     General: Bowel sounds are normal. There is no distension.     Palpations: Abdomen is soft. There is no mass.     Tenderness: There is no abdominal tenderness. There is no guarding or rebound.  Musculoskeletal:         General: Normal range of motion.     Cervical back: Neck supple.  Lymphadenopathy:     Cervical: No cervical adenopathy.  Skin:    General: Skin is warm and dry.     Capillary Refill: Capillary refill takes less than 2 seconds.  Neurological:     Mental Status: She is alert and oriented to person, place, and time.     Deep Tendon Reflexes: Reflexes normal.  Psychiatric:        Mood and Affect: Mood normal.        Behavior: Behavior normal.        06/10/2022   11:53 AM 04/24/2022   11:36 AM 06/03/2020    3:34 PM  Depression screen PHQ 2/9  Decreased Interest 1 0 3  Down, Depressed, Hopeless 0 1 1  PHQ - 2 Score '1 1 4  '$ Altered sleeping 0 1 0  Tired, decreased energy '2 2 2  '$ Change in appetite '1 1 1  '$ Feeling bad or failure about yourself  0 1 0  Trouble concentrating 0 1 0  Moving slowly or fidgety/restless 0  0 0  Suicidal thoughts 0 1 0  PHQ-9 Score '4 8 7  '$ Difficult doing work/chores Somewhat difficult Very difficult Somewhat difficult           Assessment & Plan:   Problem List Items Addressed This Visit       Cardiovascular and Mediastinum   White coat syndrome without diagnosis of hypertension   Relevant Orders   Comprehensive metabolic panel   CBC     Other   HLD (hyperlipidemia) - Primary    Difficulty adhering to medication. Recheck today. Cont lipitor 10 mg - she is hoping her sister will help motivate her.       Relevant Orders   Lipid panel   Tobacco use    Quit before for 6 months. Not ready to quit at this time.       Anxiety and depression    Improved and stable off medication. Return if worsening      Menopause    Slowly decreasing estrogen. Using 0.025 mg patches every 3-4 days. Slowly space out. Discussed she will have hot flashes/mood changes but should be short lived once she stops completely.       Relevant Orders   Comprehensive metabolic panel   CBC   Elevated hemoglobin (HCC)    Likely 2/2 to copd and tobacco use. Encourage  cessation. Recheck labs      Relevant Orders   CBC     Return in about 3 months (around 09/10/2022) for TOC with [employer].  Lesleigh Noe, MD

## 2022-06-10 NOTE — Patient Instructions (Signed)
Work on decreasing estrogen

## 2022-08-05 ENCOUNTER — Ambulatory Visit (INDEPENDENT_AMBULATORY_CARE_PROVIDER_SITE_OTHER): Payer: Medicare HMO

## 2022-08-05 VITALS — Ht 59.5 in | Wt 112.0 lb

## 2022-08-05 DIAGNOSIS — Z0000 Encounter for general adult medical examination without abnormal findings: Secondary | ICD-10-CM | POA: Diagnosis not present

## 2022-08-05 NOTE — Patient Instructions (Addendum)
[name] [name] , Thank you for taking time to come for your Medicare Wellness Visit. I appreciate your ongoing commitment to your health goals. Please review the following plan we discussed and let me know if I can assist you in the future.   These are the goals we discussed:  Goals       No current goals (pt-stated)        This is a list of the screening recommended for you and due dates:  Health Maintenance  Topic Date Due   HIV Screening  Never done   Screening for Lung Cancer  09/01/2015   COVID-19 Vaccine (1) 08/21/2022*   Zoster (Shingles) Vaccine (1 of 2) 11/05/2022*   Flu Shot  01/10/2023*   Mammogram  08/06/2023*   Colon Cancer Screening  08/06/2023*   Tetanus Vaccine  08/06/2023*   Medicare Annual Wellness Visit  09/05/2023   Hepatitis C Screening: USPSTF Recommendation to screen - Ages 18-79 yo.  Completed   HPV Vaccine  Aged Out  *Topic was postponed. The date shown is not the original due date.    Advanced directives: Advance directive discussed with you today. Even though you declined this today, please call our office should you change your mind, and we can give you the proper paperwork for you to fill out.   Conditions/risks identified: None  Next appointment: Follow up in one year for your annual wellness visit.   Preventive Care 40-64 Years, Female Preventive care refers to lifestyle choices and visits with your health care provider that can promote health and wellness. What does preventive care include? A yearly physical exam. This is also called an annual well check. Dental exams once or twice a year. Routine eye exams. Ask your health care provider how often you should have your eyes checked. Personal lifestyle choices, including: Daily care of your teeth and gums. Regular physical activity. Eating a healthy diet. Avoiding tobacco and drug use. Limiting alcohol use. Practicing safe sex. Taking low-dose aspirin daily starting at age 3. Taking vitamin  and mineral supplements as recommended by your health care provider. What happens during an annual well check? The services and screenings done by your health care provider during your annual well check will depend on your age, overall health, lifestyle risk factors, and family history of disease. Counseling  Your health care provider may ask you questions about your: Alcohol use. Tobacco use. Drug use. Emotional well-being. Home and relationship well-being. Sexual activity. Eating habits. Work and work Statistician. Method of birth control. Menstrual cycle. Pregnancy history. Screening  You may have the following tests or measurements: Height, weight, and BMI. Blood pressure. Lipid and cholesterol levels. These may be checked every 5 years, or more frequently if you are over 40 years old. Skin check. Lung cancer screening. You may have this screening every year starting at age 59 if you have a 30-pack-year history of smoking and currently smoke or have quit within the past 15 years. Fecal occult blood test (FOBT) of the stool. You may have this test every year starting at age 12. Flexible sigmoidoscopy or colonoscopy. You may have a sigmoidoscopy every 5 years or a colonoscopy every 10 years starting at age 71. Hepatitis C blood test. Hepatitis B blood test. Sexually transmitted disease (STD) testing. Diabetes screening. This is done by checking your blood sugar (glucose) after you have not eaten for a while (fasting). You may have this done every 1-3 years. Mammogram. This may be done every 1-2 years. Talk  to your health care provider about when you should start having regular mammograms. This may depend on whether you have a family history of breast cancer. BRCA-related cancer screening. This may be done if you have a family history of breast, ovarian, tubal, or peritoneal cancers. Pelvic exam and Pap test. This may be done every 3 years starting at age 46. Starting at age 34, this  may be done every 5 years if you have a Pap test in combination with an HPV test. Bone density scan. This is done to screen for osteoporosis. You may have this scan if you are at high risk for osteoporosis. Discuss your test results, treatment options, and if necessary, the need for more tests with your health care provider. Vaccines  Your health care provider may recommend certain vaccines, such as: Influenza vaccine. This is recommended every year. Tetanus, diphtheria, and acellular pertussis (Tdap, Td) vaccine. You may need a Td booster every 10 years. Zoster vaccine. You may need this after age 65. Pneumococcal 13-valent conjugate (PCV13) vaccine. You may need this if you have certain conditions and were not previously vaccinated. Pneumococcal polysaccharide (PPSV23) vaccine. You may need one or two doses if you smoke cigarettes or if you have certain conditions. Talk to your health care provider about which screenings and vaccines you need and how often you need them. This information is not intended to replace advice given to you by your health care provider. Make sure you discuss any questions you have with your health care provider. Document Released: 10/25/2015 Document Revised: 06/17/2016 Document Reviewed: 07/30/2015 Elsevier Interactive Patient Education  2017 [place] Prevention in the Home Falls can cause injuries. They can happen to people of all ages. There are many things you can do to make your home safe and to help prevent falls. What can I do on the outside of my home? Regularly fix the edges of walkways and driveways and fix any cracks. Remove anything that might make you trip as you walk through a door, such as a raised step or threshold. Trim any bushes or trees on the path to your home. Use bright outdoor lighting. Clear any walking paths of anything that might make someone trip, such as rocks or tools. Regularly check to see if handrails are loose or  broken. Make sure that both sides of any steps have handrails. Any raised decks and porches should have guardrails on the edges. Have any leaves, snow, or ice cleared regularly. Use sand or salt on walking paths during winter. Clean up any spills in your garage right away. This includes oil or grease spills. What can I do in the bathroom? Use night lights. Install grab bars by the toilet and in the tub and shower. Do not use towel bars as grab bars. Use non-skid mats or decals in the tub or shower. If you need to sit down in the shower, use a plastic, non-slip stool. Keep the floor dry. Clean up any water that spills on the floor as soon as it happens. Remove soap buildup in the tub or shower regularly. Attach bath mats securely with double-sided non-slip rug tape. Do not have throw rugs and other things on the floor that can make you trip. What can I do in the bedroom? Use night lights. Make sure that you have a light by your bed that is easy to reach. Do not use any sheets or blankets that are too big for your bed. They should not  hang down onto the floor. Have a firm chair that has side arms. You can use this for support while you get dressed. Do not have throw rugs and other things on the floor that can make you trip. What can I do in the kitchen? Clean up any spills right away. Avoid walking on wet floors. Keep items that you use a lot in easy-to-reach places. If you need to reach something above you, use a strong step stool that has a grab bar. Keep electrical cords out of the way. Do not use floor polish or wax that makes floors slippery. If you must use wax, use non-skid floor wax. Do not have throw rugs and other things on the floor that can make you trip. What can I do with my stairs? Do not leave any items on the stairs. Make sure that there are handrails on both sides of the stairs and use them. Fix handrails that are broken or loose. Make sure that handrails are as long as  the stairways. Check any carpeting to make sure that it is firmly attached to the stairs. Fix any carpet that is loose or worn. Avoid having throw rugs at the top or bottom of the stairs. If you do have throw rugs, attach them to the floor with carpet tape. Make sure that you have a light switch at the top of the stairs and the bottom of the stairs. If you do not have them, ask someone to add them for you. What else can I do to help prevent falls? Wear shoes that: Do not have high heels. Have rubber bottoms. Are comfortable and fit you well. Are closed at the toe. Do not wear sandals. If you use a stepladder: Make sure that it is fully opened. Do not climb a closed stepladder. Make sure that both sides of the stepladder are locked into place. Ask someone to hold it for you, if possible. Clearly mark and make sure that you can see: Any grab bars or handrails. First and last steps. Where the edge of each step is. Use tools that help you move around (mobility aids) if they are needed. These include: Canes. Walkers. Scooters. Crutches. Turn on the lights when you go into a dark area. Replace any light bulbs as soon as they burn out. Set up your furniture so you have a clear path. Avoid moving your furniture around. If any of your floors are uneven, fix them. If there are any pets around you, be aware of where they are. Review your medicines with your doctor. Some medicines can make you feel dizzy. This can increase your chance of falling. Ask your doctor what other things that you can do to help prevent falls. This information is not intended to replace advice given to you by your health care provider. Make sure you discuss any questions you have with your health care provider. Document Released: 07/25/2009 Document Revised: 03/05/2016 Document Reviewed: 11/02/2014 Elsevier Interactive Patient Education  2017 [employer].

## 2022-08-05 NOTE — Progress Notes (Signed)
Subjective:   [name] [name] is a 61 y.o. female who presents for Medicare Annual (Subsequent) preventive examination.  Review of Systems    Virtual Visit via Telephone Note  I connected with  [name] [name] on 08/05/22 at 10:45 AM EDT by telephone and verified that I am speaking with the correct person using two identifiers.  Location: Patient: Home Provider: Office Persons participating in the virtual visit: patient/Nurse Health Advisor   I discussed the limitations, risks, security and privacy concerns of performing an evaluation and management service by telephone and the availability of in person appointments. The patient expressed understanding and agreed to proceed.  Interactive audio and video telecommunications were attempted between this nurse and patient, however failed, due to patient having technical difficulties OR patient did not have access to video capability.  We continued and completed visit with audio only.  Some vital signs may be absent or patient reported.   Criselda Peaches, LPN  Cardiac Risk Factors include: advanced age (>37mn, >>46women);smoking/ tobacco exposure;Other (see comment), Risk factor comments: COPD     Objective:    Today's Vitals   08/05/22 1051  Weight: 112 lb (50.8 kg)  Height: 4' 11.5" (1.511 m)   Body mass index is 22.24 kg/m.     08/05/2022   11:01 AM 12/27/2015    5:46 PM 12/20/2015    9:54 AM  Advanced Directives  Does Patient Have a Medical Advance Directive? No No No  Would patient like information on creating a medical advance directive? No - Patient declined No - patient declined information No - patient declined information    Current Medications (verified) Outpatient Encounter Medications as of 08/05/2022  Medication Sig   albuterol (VENTOLIN HFA) 108 (90 Base) MCG/ACT inhaler Inhale 1-2 puffs into the lungs every 6 (six) hours as needed for wheezing or shortness of breath.   atorvastatin (LIPITOR) 10 MG tablet  Take 1 tablet (10 mg total) by mouth daily. (Patient not taking: Reported on 04/24/2022)   cyclobenzaprine (FLEXERIL) 5 MG tablet Take 1 tablet (5 mg total) by mouth 3 (three) times daily as needed for muscle spasms.   estradiol (VIVELLE-DOT) 0.025 MG/24HR PLACE 1 PATCH ONTO THE SKIN 3 (THREE) TIMES A WEEK.   loratadine (CLARITIN) 10 MG tablet Take 10 mg by mouth daily as needed for allergies.   meloxicam (MOBIC) 7.5 MG tablet Take 1 tablet (7.5 mg total) by mouth daily as needed for pain.   No facility-administered encounter medications on file as of 08/05/2022.    Allergies (verified) Codeine   History: Past Medical History:  Diagnosis Date   Anxiety    COPD (chronic obstructive pulmonary disease) ([facility]    Depression    Fibroids    Uterine   HLD (hyperlipidemia)    Major depressive disorder, recurrent ([facility] 08/27/2014   OA (osteoarthritis)    Tobacco abuse    Vitiligo 1975   Wears glasses    Past Surgical History:  Procedure Laterality Date   ABDOMINAL HYSTERECTOMY  2011   DILATION AND CURETTAGE OF UTERUS     H[place] AR[place]ROSCOPY  [place]   [place]    [facility]  TOTAL HIP ARTHROPLASTY Right 12/27/2015   Procedure: RIGHT TOTAL HIP ARTHROPLASTY ANTERIOR APPROACH;  Surgeon: CMcarthur Rossetti MD;  Location: [facility];  Service: Orthopedics;  Laterality: Right;   TUBAL LIGATION     URETER SURGERY  2011   Family History  Problem Relation Age of Onset  Depression Mother    Miscarriages / [place] Sister    Social History   Socioeconomic History   Marital status: Married    Spouse name: Roderic Palau   Number of children: 3   Years of education: GED   Highest education level: Not on file  Occupational History   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[employer]  Tobacco Use   Smoking status: Every Day    Packs/day: 1.00    Years: 44.00    Total pack years: 44.00    Types: Cigarettes   Smokeless tobacco: Never   Tobacco comments:    recently quit 04/2018  Substance and Sexual  Activity   Alcohol use: No    Comment: history of alcoholism quit 16 years ago    Drug use: Yes    Comment: Marijuana (uses daily)   Sexual activity: Yes    Birth control/protection: Surgical  Other Topics Concern   Not on file  Social History Narrative   06/03/20   From: [place] but in [place] since 1988   Living: with Roderic Palau (1991), husband   Work: on disability due to cervical and lumbar pain      Family: 2 daughters - Sharyn Lull and Bethena Roys and 1 son - Vonna Kotyk -- has 8 grandchildren      Enjoys: not sure, camping      Exercise: not currently due to chronic pain   Diet: some days good, other days not good - more junk food than real food      Safety   Seat belts: Yes    Guns: No   Safe in relationships: Yes    Social Determinants of Health   Financial Resource Strain: Low Risk  (08/05/2022)   Overall Financial Resource Strain (CARDIA)    Difficulty of Paying Living Expenses: Not hard at all  Food Insecurity: No Food Insecurity (08/05/2022)   Hunger Vital Sign    Worried About Running Out of Food in the Last Year: Never true    [place] in the Last Year: Never true  Transportation Needs: No Transportation Needs (08/05/2022)   PRAPARE - Hydrologist (Medical): No    Lack of Transportation (Non-Medical): No  Physical Activity: Inactive (08/05/2022)   Exercise Vital Sign    Days of Exercise per Week: 0 days    Minutes of Exercise per Session: 0 min  Stress: No Stress Concern Present (08/05/2022)   [place]    Feeling of Stress : Not at all  Social Connections: Moderately Isolated (08/05/2022)   Social Connection and Isolation Panel [NHANES]    Frequency of Communication with Friends and Family: More than three times a week    Frequency of Social Gatherings with Friends and Family: More than three times a week    Attends Religious Services: Never    Marine scientist or  Organizations: No    Attends Archivist Meetings: Never    Marital Status: Married    Tobacco Counseling Ready to quit: No Counseling given: Yes Tobacco comments: recently quit 04/2018   Clinical Intake:  Pre-visit preparation completed: No  Pain : No/denies pain     BMI - recorded: 22.24 Nutritional Status: BMI of 19-24  Normal Nutritional Risks: None Diabetes: No  How often do you need to have someone help you when you read instructions, pamphlets, or other written materials from your doctor or pharmacy?: 1 - Never  Diabetic?  No  Interpreter Needed?: No  Information entered by :: Rolene Arbour LPN   Activities of Daily Living    08/05/2022   10:59 AM  In your present state of health, do you have any difficulty performing the following activities:  Hearing? 0  Vision? 0  Difficulty concentrating or making decisions? 0  Walking or climbing stairs? 0  Dressing or bathing? 0  Doing errands, shopping? 0  Preparing Food and eating ? N  Using the Toilet? N  In the past six months, have you accidently leaked urine? N  Do you have problems with loss of bowel control? N  Managing your Medications? N  Managing your Finances? N  Housekeeping or managing your Housekeeping? N    Patient Care Team: Waunita Schooner, MD as PCP - General (Family Medicine)  Indicate any recent Medical Services you may have received from other than [facility] providers in the past year (date may be approximate).     Assessment:   This is a routine wellness examination for [name] [name].  Hearing/Vision screen Hearing Screening - Comments:: Denies hearing difficulties   Vision Screening - Comments:: Wears rx glasses - up to date with routine eye exams with  [facility]  Dietary issues and exercise activities discussed: Current Exercise Habits: The patient does not participate in regular exercise at present, Exercise limited by: None identified   Goals Addressed                This Visit's Progress     No current goals (pt-stated)         Depression Screen    08/05/2022   10:58 AM 06/10/2022   11:53 AM 04/24/2022   11:36 AM 06/03/2020    3:34 PM 05/19/2018    9:35 AM 07/19/2017    9:46 AM 08/23/2013    2:30 PM  PHQ 2/9 Scores  PHQ - 2 Score 0 '1 1 4 '$ 0 3 2  PHQ- 9 Score 0 '4 8 7 '$ 0 10     Fall Risk    08/05/2022   11:00 AM 08/23/2013    2:30 PM  Fall Risk   Falls in the past year? 0 No  Number falls in past yr: 0   Injury with Fall? 0   Risk for fall due to : No Fall Risks   Follow up Falls prevention discussed     [place]:  Any stairs in or around the home? Yes  If so, are there any without handrails? No  Home free of loose throw rugs in walkways, pet beds, electrical cords, etc? Yes  Adequate lighting in your home to reduce risk of falls? Yes   ASSISTIVE DEVICES UTILIZED TO PREVENT FALLS:  Life alert? No  Use of a cane, walker or w/c? No  Grab bars in the bathroom? No  Shower chair or bench in shower? No  Elevated toilet seat or a handicapped toilet? No   TIMED UP AND GO:  Was the test performed? No . Audio Visit   Cognitive Function:        08/05/2022   11:01 AM  6CIT Screen  What Year? 0 points  What month? 0 points  What time? 0 points  Count back from 20 0 points  Months in reverse 0 points  Repeat phrase 0 points  Total Score 0 points    Immunizations Immunization History  Administered Date(s) Administered   Influenza,inj,Quad PF,6+ Mos 08/23/2013, 09/16/2015, 09/22/2016, 07/19/2017   Pneumococcal Polysaccharide-23 07/19/2017   Td 02/06/2010  TDAP status: Due, Education has been provided regarding the importance of this vaccine. Advised may receive this vaccine at local pharmacy or Health Dept. Aware to provide a copy of the vaccination record if obtained from local pharmacy or Health Dept. Verbalized acceptance and understanding.  Flu Vaccine status: Declined, Education has  been provided regarding the importance of this vaccine but patient still declined. Advised may receive this vaccine at local pharmacy or Health Dept. Aware to provide a copy of the vaccination record if obtained from local pharmacy or Health Dept. Verbalized acceptance and understanding.    Covid-19 vaccine status: Declined, Education has been provided regarding the importance of this vaccine but patient still declined. Advised may receive this vaccine at local pharmacy or Health Dept.or vaccine clinic. Aware to provide a copy of the vaccination record if obtained from local pharmacy or Health Dept. Verbalized acceptance and understanding.  Qualifies for Shingles Vaccine? Yes   Zostavax completed No   Shingrix Completed?: No.    Education has been provided regarding the importance of this vaccine. Patient has been advised to call insurance company to determine out of pocket expense if they have not yet received this vaccine. Advised may also receive vaccine at local pharmacy or Health Dept. Verbalized acceptance and understanding.  Screening Tests Health Maintenance  Topic Date Due   HIV Screening  Never done   Lung Cancer Screening  09/01/2015   COVID-19 Vaccine (1) 08/21/2022 (Originally 12/14/1961)   Zoster Vaccines- Shingrix (1 of 2) 11/05/2022 (Originally 06/17/2011)   INFLUENZA VACCINE  01/10/2023 (Originally 05/12/2022)   MAMMOGRAM  08/06/2023 (Originally 01/21/2022)   COLONOSCOPY (Pts 45-61yr Insurance coverage will need to be confirmed)  08/06/2023 (Originally 06/16/2006)   TETANUS/TDAP  08/06/2023 (Originally 02/07/2020)   Medicare Annual Wellness (AWV)  09/05/2023   Hepatitis C Screening  Completed   HPV VACCINES  Aged Out    Health Maintenance  Health Maintenance Due  Topic Date Due   HIV Screening  Never done   Lung Cancer Screening  09/01/2015    Colorectal cancer screening: Referral to GI placed Patient deferred. Pt aware the office will call re: appt.  Mammogram status:  Ordered Patient deferred. Pt provided with contact info and advised to call to schedule appt.     Lung Cancer Screening: (Low Dose CT Chest recommended if Age 61-80years, 30 pack-year currently smoking OR have quit w/in 15years.) does qualify.   Lung Cancer Screening Referral: Patient deferred  Additional Screening:  Hepatitis C Screening: does qualify; Completed 05/19/18  Vision Screening: Recommended annual ophthalmology exams for early detection of glaucoma and other disorders of the eye. Is the patient up to date with their annual eye exam?  Yes  Who is the provider or what is the name of the office in which the patient attends annual eye exams? [facility] If pt is not established with a provider, would they like to be referred to a provider to establish care? No .   Dental Screening: Recommended annual dental exams for proper oral hygiene  Community Resource Referral / Chronic Care Management:  CRR required this visit?  No   CCM required this visit?  No      Plan:     I have personally reviewed and noted the following in the patient's chart:   Medical and social history Use of alcohol, tobacco or illicit drugs  Current medications and supplements including opioid prescriptions. Patient is not currently taking opioid prescriptions. Functional ability and status Nutritional status  Physical activity Advanced directives List of other physicians Hospitalizations, surgeries, and ER visits in previous 12 months Vitals Screenings to include cognitive, depression, and falls Referrals and appointments  In addition, I have reviewed and discussed with patient certain preventive protocols, quality metrics, and best practice recommendations. A written personalized care plan for preventive services as well as general preventive health recommendations were provided to patient.     Criselda Peaches, LPN   [id number]   Nurse Notes: Patient due labs HIV Screening and Lung  Cancer Screening

## 2022-12-16 DIAGNOSIS — Z0100 Encounter for examination of eyes and vision without abnormal findings: Secondary | ICD-10-CM | POA: Diagnosis not present

## 2022-12-16 DIAGNOSIS — H5203 Hypermetropia, bilateral: Secondary | ICD-10-CM | POA: Diagnosis not present

## 2023-01-06 ENCOUNTER — Encounter: Payer: Self-pay | Admitting: Family

## 2023-01-06 ENCOUNTER — Ambulatory Visit (INDEPENDENT_AMBULATORY_CARE_PROVIDER_SITE_OTHER): Payer: Medicare HMO | Admitting: Family

## 2023-01-06 VITALS — BP 132/82 | HR 78 | Temp 97.8°F | Ht 59.5 in | Wt 112.0 lb

## 2023-01-06 DIAGNOSIS — Z1211 Encounter for screening for malignant neoplasm of colon: Secondary | ICD-10-CM

## 2023-01-06 DIAGNOSIS — M546 Pain in thoracic spine: Secondary | ICD-10-CM

## 2023-01-06 DIAGNOSIS — R232 Flushing: Secondary | ICD-10-CM | POA: Diagnosis not present

## 2023-01-06 DIAGNOSIS — R011 Cardiac murmur, unspecified: Secondary | ICD-10-CM

## 2023-01-06 DIAGNOSIS — F17210 Nicotine dependence, cigarettes, uncomplicated: Secondary | ICD-10-CM | POA: Insufficient documentation

## 2023-01-06 DIAGNOSIS — E162 Hypoglycemia, unspecified: Secondary | ICD-10-CM

## 2023-01-06 DIAGNOSIS — Z780 Asymptomatic menopausal state: Secondary | ICD-10-CM | POA: Diagnosis not present

## 2023-01-06 DIAGNOSIS — Z720 Tobacco use: Secondary | ICD-10-CM

## 2023-01-06 DIAGNOSIS — D539 Nutritional anemia, unspecified: Secondary | ICD-10-CM

## 2023-01-06 DIAGNOSIS — Z79899 Other long term (current) drug therapy: Secondary | ICD-10-CM | POA: Diagnosis not present

## 2023-01-06 DIAGNOSIS — R944 Abnormal results of kidney function studies: Secondary | ICD-10-CM

## 2023-01-06 DIAGNOSIS — Z1231 Encounter for screening mammogram for malignant neoplasm of breast: Secondary | ICD-10-CM

## 2023-01-06 DIAGNOSIS — E7800 Pure hypercholesterolemia, unspecified: Secondary | ICD-10-CM | POA: Diagnosis not present

## 2023-01-06 DIAGNOSIS — R030 Elevated blood-pressure reading, without diagnosis of hypertension: Secondary | ICD-10-CM

## 2023-01-06 DIAGNOSIS — R69 Illness, unspecified: Secondary | ICD-10-CM | POA: Diagnosis not present

## 2023-01-06 DIAGNOSIS — G8929 Other chronic pain: Secondary | ICD-10-CM

## 2023-01-06 LAB — COMPREHENSIVE METABOLIC PANEL
ALT: 12 U/L (ref 0–35)
AST: 14 U/L (ref 0–37)
Albumin: 4.5 g/dL (ref 3.5–5.2)
Alkaline Phosphatase: 69 U/L (ref 39–117)
BUN: 12 mg/dL (ref 6–23)
CO2: 32 mEq/L (ref 19–32)
Calcium: 9.8 mg/dL (ref 8.4–10.5)
Chloride: 105 mEq/L (ref 96–112)
Creatinine, Ser: 0.9 mg/dL (ref 0.40–1.20)
GFR: 68.98 mL/min (ref >60.00)
Glucose, Bld: 76 mg/dL (ref 70–99)
Potassium: 4.7 mEq/L (ref 3.5–5.1)
Sodium: 142 mEq/L (ref 135–145)
Total Bilirubin: 0.5 mg/dL (ref 0.2–1.2)
Total Protein: 6.8 g/dL (ref 6.0–8.3)

## 2023-01-06 LAB — LIPID PANEL
Cholesterol: 183 mg/dL (ref 0–200)
HDL: 60.2 mg/dL (ref >39.00)
LDL Cholesterol: 97 mg/dL (ref 0–99)
NonHDL: 122.92
Total CHOL/HDL Ratio: 3
Triglycerides: 131 mg/dL (ref 0.0–149.0)
VLDL: 26.2 mg/dL (ref 0.0–40.0)

## 2023-01-06 LAB — VITAMIN B12: Vitamin B-12: 181 pg/mL — ABNORMAL LOW (ref 211–911)

## 2023-01-06 LAB — CBC
HCT: 47.8 % — ABNORMAL HIGH (ref 36.0–46.0)
Hemoglobin: 16.1 g/dL — ABNORMAL HIGH (ref 12.0–15.0)
MCHC: 33.7 g/dL (ref 30.0–36.0)
MCV: 91.8 fl (ref 78.0–100.0)
Platelets: 180 10*3/uL (ref 150.0–400.0)
RBC: 5.21 Mil/uL — ABNORMAL HIGH (ref 3.87–5.11)
RDW: 13.8 % (ref 11.5–15.5)
WBC: 8.1 10*3/uL (ref 4.0–10.5)

## 2023-01-06 LAB — TSH: TSH: 2.58 u[IU]/mL (ref 0.35–5.50)

## 2023-01-06 LAB — HEMOGLOBIN A1C: Hgb A1c MFr Bld: 5.5 % (ref 4.6–6.5)

## 2023-01-06 LAB — FOLLICLE STIMULATING HORMONE: FSH: 119 m[IU]/mL

## 2023-01-06 MED ORDER — CYCLOBENZAPRINE HCL 5 MG PO TABS: 5.0000 mg | ORAL_TABLET | Freq: Three times a day (TID) | ORAL | 0 refills | Status: DC | PRN

## 2023-01-06 MED ORDER — ATORVASTATIN CALCIUM 10 MG PO TABS: 10.0000 mg | ORAL_TABLET | Freq: Every day | ORAL | 3 refills | Status: DC

## 2023-01-06 NOTE — Patient Instructions (Addendum)
Welcome to our clinic, I am happy to have you as my new patient. I am excited to continue on this healthcare journey with you.  Referral to lung cancer screening program made. Please let us know if you have not heard back within 2 weeks about the referral.   ------------------------------------  I have ordered imaging for you at [facility]. This order has been sent over for you electronically. This order is for an echocardiogram.  Please call [phone] to schedule this appointment.  ------------------------------------ I have sent an electronic order over to your preferred location for the following:   []   2D Mammogram  [x]   3D Mammogram  [x]   Bone Density   Please give this center a call to get scheduled at your convenience.  [x]   [facility]  [address] [address]  [phone]  Make sure to wear two piece  clothing  No lotions powders or deodorants the day of the appointment Make sure to bring picture ID and insurance card.  Bring list of medications you are currently taking including any supplements.   ------------------------------------  Stop by the lab prior to leaving today. I will notify you of your results once received.   Please keep in mind Any my chart messages you send have up to a three business day turnaround for a response.  Phone calls may take up to a one full business day turnaround for a  response.   If you need a medication refill I recommend you request it through the pharmacy as this is easiest for [place] rather than sending a message and or phone call.   Due to recent changes in healthcare laws, you may see results of your imaging and/or laboratory studies on MyChart before I have had a chance to review them.  I understand that in some cases there may be results that are confusing or concerning to you. Please understand that not all results are  received at the same time and often I may need to interpret multiple results in order to provide you with the best plan of care or course of treatment. Therefore, I ask that you please give me 2 business days to thoroughly review all your results before contacting my office for clarification. Should we see a critical lab result, you will be contacted sooner.   It was a pleasure seeing you today! Please do not hesitate to reach out with any questions and or concerns.  Regards,   Eugenia Pancoast FNP-C

## 2023-01-06 NOTE — Progress Notes (Unsigned)
Established Patient Office Visit  Subjective:  Patient ID: [name] [name], female    DOB: 07/02/1961  Age: 62 y.o. MRN: [id number]  CC:  Chief Complaint  Patient presents with   Establish Care    HPI [name] [name] is here for a transition of care visit.  Prior provider was: Dr. Waunita Schooner    Pt is with acute concerns.  Still with hot flashes often sweating in day time and night time. Sleeps with a fan at night.     chronic concerns:  Chronic back pain: taking flexeril as needed when she has flare ups.  Currently going through physical therapy and getting dry needling.   HLD: atorvastatin 10 mg just started taking this in October. No myalgias tolerating well.   Arthritis: tries to take tylenol only rather than nsaids to help with kidney function.  Drinks a slight bit more water than she used to. Drinks about 20-40 ounces a day.   COPD: uses albuterol sparingly.  mMRC dyspnea scale, asked in office, on a scale 0-4  0 I only get breathless with strenuous exercise  1 I get short of breath when hurrying on level ground or walking up a slight hill  2 on level ground, I walk slower than people of the same age because of breathlessness  or have to stop for breath when walking my own pace  3 I stop for breath after walking about 100 yards or after a few minutes on level ground  4 I am too breathless to lave the house or I am breathless when dressing   Have you had 2 or more moderate exacerbations or at least 1 hospitalization for COPD exacerbation in the past year? No   Does the patient have high peripheral eosinophil levels (>300 ): unknown, will order today.  Cigarette smoker: about 18-22 cigarettes a day. She is a 50 pack year smoker.     Past Medical History:  Diagnosis Date   Anxiety    COPD (chronic obstructive pulmonary disease) ([place])    Depression    Fibroids    Uterine   HLD (hyperlipidemia)    Major depressive disorder, recurrent ([place]) 08/27/2014    OA (osteoarthritis)    Tobacco abuse    Vitiligo 1975   Wears glasses     Past Surgical History:  Procedure Laterality Date   DILATION AND CURETTAGE OF UTERUS     HIP ARTHROSCOPY  10/12/2001   right    LAPAROSCOPIC TOTAL HYSTERECTOMY  10/12/2009   MANDIBLE SURGERY  10/12/1978   TOTAL HIP ARTHROPLASTY Right 12/27/2015   Procedure: RIGHT TOTAL HIP ARTHROPLASTY ANTERIOR APPROACH;  Surgeon: Mcarthur Rossetti, MD;  Location: [facility];  Service: Orthopedics;  Laterality: Right;   TUBAL LIGATION     URETER SURGERY  10/12/2009    Family History  Problem Relation Age of Onset   Diabetes Father    Lung cancer Father        smoker   Miscarriages / [place] Sister    Lung cancer Sister        smoker   Depression Maternal Grandmother     Social History   Socioeconomic History   Marital status: Married    Spouse name: Roderic Palau   Number of children: 3   Years of education: GED   Highest education level: Not on file  Occupational History   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[employer]  Tobacco Use   Smoking status: Every Day    Packs/day: 1.00    Years:  50.00    Additional pack years: 0.00    Total pack years: 50.00    Types: Cigarettes   Smokeless tobacco: Never   Tobacco comments:    recently quit 04/2018  Vaping Use   Vaping Use: Never used  Substance and Sexual Activity   Alcohol use: No    Comment: history of alcoholism quit 16 years ago    Drug use: Yes    Types: Marijuana    Comment: Marijuana (uses daily)   Sexual activity: Yes    Partners: Male    Birth control/protection: Surgical  Other Topics Concern   Not on file  Social History Narrative   06/03/20   From: [place] but in [place] since 1988   Living: with Roderic Palau (1991), husband   Work: on disability due to cervical and lumbar pain      Family: 2 daughters - Sharyn Lull and Bethena Roys and 1 son - Vonna Kotyk -- has 8 grandchildren      Enjoys: not sure, camping      Exercise: not currently due to chronic pain   Diet: some days  good, other days not good - more junk food than real food      Safety   Seat belts: Yes    Guns: No   Safe in relationships: Yes    Social Determinants of Health   Financial Resource Strain: Low Risk  (08/05/2022)   Overall Financial Resource Strain (CARDIA)    Difficulty of Paying Living Expenses: Not hard at all  Food Insecurity: No Food Insecurity (08/05/2022)   Hunger Vital Sign    Worried About Running Out of Food in the Last Year: Never true    Ran Out of Food in the Last Year: Never true  Transportation Needs: No Transportation Needs (08/05/2022)   PRAPARE - Hydrologist (Medical): No    Lack of Transportation (Non-Medical): No  Physical Activity: Inactive (08/05/2022)   Exercise Vital Sign    Days of Exercise per Week: 0 days    Minutes of Exercise per Session: 0 min  Stress: No Stress Concern Present (08/05/2022)   [place]    Feeling of Stress : Not at all  Social Connections: Moderately Isolated (08/05/2022)   Social Connection and Isolation Panel [NHANES]    Frequency of Communication with Friends and Family: More than three times a week    Frequency of Social Gatherings with Friends and Family: More than three times a week    Attends Religious Services: Never    Marine scientist or Organizations: No    Attends Archivist Meetings: Never    Marital Status: Married  Human resources officer Violence: Not At Risk (08/05/2022)   Humiliation, Afraid, Rape, and Kick questionnaire    Fear of Current or Ex-Partner: No    Emotionally Abused: No    Physically Abused: No    Sexually Abused: No    Outpatient Medications Prior to Visit  Medication Sig Dispense Refill   albuterol (VENTOLIN HFA) 108 (90 Base) MCG/ACT inhaler Inhale 1-2 puffs into the lungs every 6 (six) hours as needed for wheezing or shortness of breath. 8 g 3   fexofenadine (ALLEGRA) 180 MG tablet Take  180 mg by mouth daily.     loratadine (CLARITIN) 10 MG tablet Take 10 mg by mouth daily as needed for allergies.     atorvastatin (LIPITOR) 10 MG tablet Take 1 tablet (10 mg total) by  mouth daily. 90 tablet 3   cyclobenzaprine (FLEXERIL) 5 MG tablet Take 1 tablet (5 mg total) by mouth 3 (three) times daily as needed for muscle spasms. 30 tablet 0   estradiol (VIVELLE-DOT) 0.025 MG/24HR PLACE 1 PATCH ONTO THE SKIN 3 (THREE) TIMES A WEEK. 16 patch 0   meloxicam (MOBIC) 7.5 MG tablet Take 1 tablet (7.5 mg total) by mouth daily as needed for pain. 30 tablet 0   No facility-administered medications prior to visit.    Allergies  Allergen Reactions   Codeine Nausea Only    Pt states when given codeine Zofran will not work for the nausea     ROS: Pertinent symptoms negative unless otherwise noted in HPI     Objective:    Physical Exam Vitals reviewed.  Constitutional:      General: She is not in acute distress.    Appearance: Normal appearance. She is not ill-appearing or toxic-appearing.  HENT:     Right Ear: Tympanic membrane normal.     Left Ear: Tympanic membrane normal.     Mouth/Throat:     Mouth: Mucous membranes are moist.     Pharynx: No pharyngeal swelling.     Tonsils: No tonsillar exudate.  Eyes:     Extraocular Movements: Extraocular movements intact.     Conjunctiva/sclera: Conjunctivae normal.     Pupils: Pupils are equal, round, and reactive to light.  Neck:     Thyroid: No thyroid mass.  Cardiovascular:     Rate and Rhythm: Normal rate and regular rhythm.     Heart sounds: Murmur heard.  Pulmonary:     Effort: Pulmonary effort is normal.     Breath sounds: Normal breath sounds.  Musculoskeletal:        General: Normal range of motion.     Right lower leg: No edema.     Left lower leg: No edema.  Lymphadenopathy:     Cervical:     Right cervical: No superficial cervical adenopathy.    Left cervical: No superficial cervical adenopathy.  Skin:    General:  Skin is warm.     Capillary Refill: Capillary refill takes less than 2 seconds.  Neurological:     General: No focal deficit present.     Mental Status: She is alert and oriented to person, place, and time.  Psychiatric:        Mood and Affect: Mood normal.        Behavior: Behavior normal.        Thought Content: Thought content normal.        Judgment: Judgment normal.     Gen: NAD, resting comfortably CV: RRR with no murmurs appreciated Pulm: NWOB, CTAB with no crackles, wheezes, or rhonchi Skin: warm, dry Psych: Normal affect and thought content  BP 132/82   Pulse 78   Temp 97.8 F (36.6 C) (Temporal)   Ht 4' 11.5" (1.511 m)   Wt 112 lb (50.8 kg)   SpO2 97%   BMI 22.24 kg/m  Wt Readings from Last 3 Encounters:  01/06/23 112 lb (50.8 kg)  08/05/22 112 lb (50.8 kg)  06/10/22 112 lb 2 oz (50.9 kg)     Health Maintenance Due  Topic Date Due   Lung Cancer Screening  09/01/2015   DTaP/Tdap/Td (2 - Tdap) 02/07/2020    There are no preventive care reminders to display for this patient.  Lab Results  Component Value Date   TSH 2.58 01/06/2023   Lab Results  Component Value Date   WBC 8.1 01/06/2023   HGB 16.1 (H) 01/06/2023   HCT 47.8 (H) 01/06/2023   MCV 91.8 01/06/2023   PLT 180.0 01/06/2023   Lab Results  Component Value Date   NA 142 01/06/2023   K 4.7 01/06/2023   CO2 32 01/06/2023   GLUCOSE 76 01/06/2023   BUN 12 01/06/2023   CREATININE 0.90 01/06/2023   BILITOT 0.5 01/06/2023   ALKPHOS 69 01/06/2023   AST 14 01/06/2023   ALT 12 01/06/2023   PROT 6.8 01/06/2023   ALBUMIN 4.5 01/06/2023   CALCIUM 9.8 01/06/2023   ANIONGAP 6 12/28/2015   GFR 68.98 01/06/2023   Lab Results  Component Value Date   CHOL 183 01/06/2023   Lab Results  Component Value Date   HDL 60.20 01/06/2023   Lab Results  Component Value Date   LDLCALC 97 01/06/2023   Lab Results  Component Value Date   TRIG 131.0 01/06/2023   Lab Results  Component Value Date    CHOLHDL 3 01/06/2023   Lab Results  Component Value Date   HGBA1C 5.5 01/06/2023      Assessment & Plan:   Cigarette smoker -     Ambulatory Referral for Lung Cancer Scre  Pure hypercholesterolemia Assessment & Plan: Ordered lipid panel, pending results. Work on low cholesterol diet and exercise as tolerated Continue atorvastatin 10 mg qhs  Refill sent into pharmacy   Orders: -     Lipid panel  Chronic bilateral thoracic back pain Assessment & Plan: Discussed exercises  Refilled flexeril to take as needed   Orders: -     Cyclobenzaprine HCl; Take 1 tablet (5 mg total) by mouth 3 (three) times daily as needed for muscle spasms.  Dispense: 30 tablet; Refill: 0  Decreased GFR  On statin therapy -     Comprehensive metabolic panel  Macrocytic anemia -     CBC -     Vitamin B12  Menopause Assessment & Plan: R/o cause of hot flashes  > 60 less lkely menopausal  Lab work ordered pending results.   Orders: -     Follicle stimulating hormone -     Prolactin -     TSH  Hot flashes -     Follicle stimulating hormone -     Prolactin -     TSH  Hypoglycemia -     Hemoglobin A1c  Screening mammogram for breast cancer -     3D Screening Mammogram, Left and Right; Future  Postmenopausal -     DG Bone Density; Future  Screening for colon cancer -     Cologuard  Murmur Assessment & Plan: Heard on exam today Echocardiogram ordered pending results.   Orders: -     ECHOCARDIOGRAM COMPLETE; Future  Tobacco use Assessment & Plan: Smoking cessation instruction/counseling given:  counseled patient on the dangers of tobacco use, advised patient to stop smoking, and reviewed strategies to maximize success Referred for lung cancer screening    White coat syndrome without diagnosis of hypertension Assessment & Plan: Stable today in office.      Meds ordered this encounter  Medications   DISCONTD: atorvastatin (LIPITOR) 10 MG tablet    Sig: Take 1 tablet  (10 mg total) by mouth daily.    Dispense:  90 tablet    Refill:  3   cyclobenzaprine (FLEXERIL) 5 MG tablet    Sig: Take 1 tablet (5 mg total) by mouth 3 (three) times daily as  needed for muscle spasms.    Dispense:  30 tablet    Refill:  0    Follow-up: Return in about 6 months (around 07/09/2023) for f/u CPE, f/u cholesterol.    Eugenia Pancoast, FNP

## 2023-01-07 ENCOUNTER — Other Ambulatory Visit: Payer: Self-pay | Admitting: Family

## 2023-01-07 DIAGNOSIS — R011 Cardiac murmur, unspecified: Secondary | ICD-10-CM | POA: Insufficient documentation

## 2023-01-07 DIAGNOSIS — E7800 Pure hypercholesterolemia, unspecified: Secondary | ICD-10-CM

## 2023-01-07 LAB — PROLACTIN: Prolactin: 7.2 ng/mL

## 2023-01-07 MED ORDER — ROSUVASTATIN CALCIUM 20 MG PO TABS: 20.0000 mg | ORAL_TABLET | Freq: Every day | ORAL | 3 refills | Status: DC

## 2023-01-07 MED ORDER — ATORVASTATIN CALCIUM 20 MG PO TABS: 20.0000 mg | ORAL_TABLET | Freq: Every day | ORAL | 3 refills | Status: AC

## 2023-01-07 NOTE — Assessment & Plan Note (Addendum)
Smoking cessation instruction/counseling given:  counseled patient on the dangers of tobacco use, advised patient to stop smoking, and reviewed strategies to maximize success Referred for lung cancer screening

## 2023-01-07 NOTE — Progress Notes (Signed)
noted 

## 2023-01-07 NOTE — Assessment & Plan Note (Signed)
Ordered lipid panel, pending results. Work on low cholesterol diet and exercise as tolerated Continue atorvastatin 10 mg qhs  Refill sent into pharmacy

## 2023-01-07 NOTE — Assessment & Plan Note (Signed)
R/o cause of hot flashes  > 60 less lkely menopausal  Lab work ordered pending results.

## 2023-01-07 NOTE — Assessment & Plan Note (Signed)
Heard on exam today Echocardiogram ordered pending results.

## 2023-01-07 NOTE — Assessment & Plan Note (Signed)
Stable today in office.

## 2023-01-07 NOTE — Assessment & Plan Note (Signed)
Discussed exercises  Refilled flexeril to take as needed

## 2023-01-07 NOTE — Progress Notes (Signed)
Vitamin B12 very low, please set up for B12 injections, 1000 mcg IM once monthly for three months, also schedule 3 month f/u appt to repeat labs and f/u in office. Recommend also oral otc 1000 mcg once daily vitamin B12  There is some great improvement with cholesterol since last time however I would like to increase atorvastatin to 20 mg once daily because goal LDL <70 and we are in the 90's

## 2023-01-13 ENCOUNTER — Ambulatory Visit (INDEPENDENT_AMBULATORY_CARE_PROVIDER_SITE_OTHER): Payer: Medicare HMO

## 2023-01-13 DIAGNOSIS — E538 Deficiency of other specified B group vitamins: Secondary | ICD-10-CM

## 2023-01-13 DIAGNOSIS — Z1211 Encounter for screening for malignant neoplasm of colon: Secondary | ICD-10-CM | POA: Diagnosis not present

## 2023-01-13 MED ORDER — CYANOCOBALAMIN 1000 MCG/ML IJ SOLN
1000.0000 ug | Freq: Once | INTRAMUSCULAR | Status: AC
  Administered 2023-01-13: 1000 ug via INTRAMUSCULAR

## 2023-01-13 NOTE — Progress Notes (Signed)
After obtaining consent, and per orders of Karl Ito, NP, injection of B-12 in right deltoid given by Ferne Reus. Patient tolerated procedure well.

## 2023-01-22 LAB — COLOGUARD: COLOGUARD: NEGATIVE

## 2023-02-16 ENCOUNTER — Ambulatory Visit (INDEPENDENT_AMBULATORY_CARE_PROVIDER_SITE_OTHER): Payer: Medicare HMO

## 2023-02-16 DIAGNOSIS — E538 Deficiency of other specified B group vitamins: Secondary | ICD-10-CM | POA: Diagnosis not present

## 2023-02-16 MED ORDER — CYANOCOBALAMIN 1000 MCG/ML IJ SOLN
1000.0000 ug | Freq: Once | INTRAMUSCULAR | Status: AC
  Administered 2023-02-16: 1000 ug via INTRAMUSCULAR

## 2023-02-16 NOTE — Progress Notes (Signed)
Per orders of Tabitha Dugal FNP, injection of Vitamin B 12 in left deltoid given by Lewanda Rife. Patient tolerated injection well. This is shot 2 of Vitamin B 12.

## 2023-03-23 ENCOUNTER — Ambulatory Visit
Admission: RE | Admit: 2023-03-23 | Discharge: 2023-03-23 | Disposition: A | Discharge status: Home / Self Care | Payer: Medicare HMO | Source: Ambulatory Visit | Attending: Family | Admitting: Family

## 2023-03-23 ENCOUNTER — Ambulatory Visit (INDEPENDENT_AMBULATORY_CARE_PROVIDER_SITE_OTHER): Payer: Medicare HMO | Admitting: *Deleted

## 2023-03-23 DIAGNOSIS — Z780 Asymptomatic menopausal state: Secondary | ICD-10-CM | POA: Insufficient documentation

## 2023-03-23 DIAGNOSIS — Z1231 Encounter for screening mammogram for malignant neoplasm of breast: Secondary | ICD-10-CM | POA: Insufficient documentation

## 2023-03-23 DIAGNOSIS — E538 Deficiency of other specified B group vitamins: Secondary | ICD-10-CM | POA: Diagnosis not present

## 2023-03-23 DIAGNOSIS — M8589 Other specified disorders of bone density and structure, multiple sites: Secondary | ICD-10-CM | POA: Diagnosis not present

## 2023-03-23 MED ORDER — CYANOCOBALAMIN 1000 MCG/ML IJ SOLN
1000.0000 ug | Freq: Once | INTRAMUSCULAR | Status: AC
  Administered 2023-03-23: 1000 ug via INTRAMUSCULAR

## 2023-03-23 NOTE — Progress Notes (Signed)
Per orders of Mort Sawyers FNP, injection of Vitamin B 12 given by Sydell Axon. Patient tolerated injection well.

## 2023-03-24 ENCOUNTER — Telehealth: Payer: Self-pay | Admitting: Family

## 2023-03-24 NOTE — Telephone Encounter (Signed)
Patient was instructed to take calcium 1200 and vitamin d3 800. She called stating that she has calcium 600,is it okay to take 2? And she has vitamin d3 400 soft gels,is it okay to take 2 of those? Please advise.

## 2023-03-25 NOTE — Telephone Encounter (Signed)
Called patient reviewed all information and repeated back to me. Will call if any questions.  ? ?

## 2023-03-25 NOTE — Telephone Encounter (Signed)
Yes this is fine. I would recommend taking twice daily aka one calcium one vitamin d in am and the other in the pm just for digestive purposes.

## 2023-04-12 ENCOUNTER — Encounter: Payer: Self-pay | Admitting: Family

## 2023-04-12 ENCOUNTER — Ambulatory Visit (INDEPENDENT_AMBULATORY_CARE_PROVIDER_SITE_OTHER): Payer: Medicare HMO | Admitting: Family

## 2023-04-12 VITALS — BP 128/76 | HR 65 | Temp 97.5°F | Ht 59.5 in | Wt 112.0 lb

## 2023-04-12 DIAGNOSIS — Z720 Tobacco use: Secondary | ICD-10-CM | POA: Diagnosis not present

## 2023-04-12 DIAGNOSIS — M5441 Lumbago with sciatica, right side: Secondary | ICD-10-CM | POA: Diagnosis not present

## 2023-04-12 DIAGNOSIS — M85852 Other specified disorders of bone density and structure, left thigh: Secondary | ICD-10-CM

## 2023-04-12 MED ORDER — PREDNISONE 10 MG (21) PO TBPK
ORAL_TABLET | ORAL | 0 refills | Status: AC
Start: 2023-04-12 — End: ?

## 2023-04-12 MED ORDER — TIZANIDINE HCL 4 MG PO TABS
ORAL_TABLET | ORAL | 0 refills | Status: DC
Start: 2023-04-12 — End: 2023-05-06

## 2023-04-12 MED ORDER — CALCIUM-VITAMIN D 600-5 MG-MCG PO TABS: 1.0000 | ORAL_TABLET | Freq: Every day | ORAL | Status: AC

## 2023-04-12 NOTE — Assessment & Plan Note (Signed)
Smoking cessation instruction/counseling given:  counseled patient on the dangers of tobacco use, advised patient to stop smoking, and reviewed strategies to maximize success 

## 2023-04-12 NOTE — Progress Notes (Signed)
Established Patient Office Visit  Subjective:      CC:  Chief Complaint  Patient presents with   Medical Management of Chronic Issues    3 mo f/u  Completed 3 mo of b12 injections    HPI: [name] [name] is a 62 y.o. female presenting on 04/12/2023 for Medical Management of Chronic Issues (3 mo f/u /Completed 3 mo of b12 injections) . Murmur: echo was ordered but pt has not yet scheduled.  B12 def: completed b12 x 3. Taking otc 1000 mcg once daily.  Lab Results  Component Value Date   VITAMINB12 181 (L) 01/06/2023   Hyperlipidemia: denies myalgias tolerating well. On lipitor 20 mg nightly.  Lab Results  Component Value Date   CHOL 183 01/06/2023   HDL 60.20 01/06/2023   LDLCALC 97 01/06/2023   LDLDIRECT 153.4 08/22/2013   TRIG 131.0 01/06/2023   CHOLHDL 3 01/06/2023   Osteopenia: just started calcium and vitamin D supplementation, taking daily. Recent bone density with this on 6/11. In the left hip and AP spine.   Mammogram: recently completed, 03/23/23.   New complaints: Right sided sciatica really causing her pain. Every am hard to step down from the bed, goes down her bed every am. Does have lower back issues.  Does use muscle relaxers with help sleeping with slight improvement.  Hesitant to take nsaids due to kidney function.   Last metabolic panel Lab Results  Component Value Date   GLUCOSE 76 01/06/2023   NA 142 01/06/2023   K 4.7 01/06/2023   CL 105 01/06/2023   CO2 32 01/06/2023   BUN 12 01/06/2023   CREATININE 0.90 01/06/2023   GFRNONAA 64 05/19/2018   CALCIUM 9.8 01/06/2023   PROT 6.8 01/06/2023   ALBUMIN 4.5 01/06/2023   LABGLOB 2.0 05/19/2018   AGRATIO 2.2 05/19/2018   BILITOT 0.5 01/06/2023   ALKPHOS 69 01/06/2023   AST 14 01/06/2023   ALT 12 01/06/2023   ANIONGAP 6 12/28/2015      Social history:  Relevant past medical, surgical, family and social history reviewed and updated as indicated. Interim medical history since our last  visit reviewed.  Allergies and medications reviewed and updated.  DATA REVIEWED: CHART IN EPIC     ROS: Negative unless specifically indicated above in HPI.    Current Outpatient Medications:    albuterol (VENTOLIN HFA) 108 (90 Base) MCG/ACT inhaler, Inhale 1-2 puffs into the lungs every 6 (six) hours as needed for wheezing or shortness of breath., Disp: 8 g, Rfl: 3   atorvastatin (LIPITOR) 20 MG tablet, Take 1 tablet (20 mg total) by mouth daily., Disp: 90 tablet, Rfl: 3   Calcium-Vitamin D 600-5 MG-MCG TABS, Take 1 tablet by mouth daily., Disp: , Rfl:    fexofenadine (ALLEGRA) 180 MG tablet, Take 180 mg by mouth daily., Disp: , Rfl:    loratadine (CLARITIN) 10 MG tablet, Take 10 mg by mouth daily as needed for allergies., Disp: , Rfl:    predniSONE (STERAPRED UNI-PAK 21 TAB) 10 MG (21) TBPK tablet, Take as directed, Disp: 1 each, Rfl: 0   tiZANidine (ZANAFLEX) 4 MG tablet, Take 1/2 tablet po at bedtime prn muscle relaxer, Disp: 30 tablet, Rfl: 0      Objective:    BP 128/76   Pulse 65   Temp (!) 97.5 F (36.4 C) (Temporal)   Ht 4' 11.5" (1.511 m)   Wt 112 lb (50.8 kg)   SpO2 100%   BMI 22.24 kg/m  Wt Readings from Last 3 Encounters:  04/12/23 112 lb (50.8 kg)  01/06/23 112 lb (50.8 kg)  08/05/22 112 lb (50.8 kg)    Physical Exam Constitutional:      General: She is not in acute distress.    Appearance: Normal appearance. She is normal weight. She is not ill-appearing, toxic-appearing or diaphoretic.  HENT:     Head: Normocephalic.  Cardiovascular:     Rate and Rhythm: Normal rate and regular rhythm.     Heart sounds: Murmur heard.  Pulmonary:     Effort: Pulmonary effort is normal.     Breath sounds: Normal breath sounds.  Musculoskeletal:     Lumbar back: Spasms and tenderness present. Decreased range of motion. Positive right straight leg raise test. Negative left straight leg raise test.  Neurological:     General: No focal deficit present.     Mental  Status: She is alert and oriented to person, place, and time. Mental status is at baseline.  Psychiatric:        Mood and Affect: Mood normal.        Behavior: Behavior normal.        Thought Content: Thought content normal.        Judgment: Judgment normal.           Assessment & Plan:  Tobacco use Assessment & Plan: Smoking cessation instruction/counseling given:  counseled patient on the dangers of tobacco use, advised patient to stop smoking, and reviewed strategies to maximize success    Osteopenia of left hip Assessment & Plan: Continue calcium and vitamin D daily  Orders: -     Calcium-Vitamin D; Take 1 tablet by mouth daily.  Acute back pain with sciatica, right Assessment & Plan: Rx prednisone pack  Do not take ibuprofen with this  Rx tizanidine 1/2 tablet prn muscle spasm  Heat/ice to site. Lidocaine patches Discussed stretching exercises   Orders: -     tiZANidine HCl; Take 1/2 tablet po at bedtime prn muscle relaxer  Dispense: 30 tablet; Refill: 0 -     predniSONE; Take as directed  Dispense: 1 each; Refill: 0     Return in about 6 months (around 10/13/2023) for f/u CPE.  Mort Sawyers, MSN, APRN, FNP-C [facility]

## 2023-04-12 NOTE — Assessment & Plan Note (Signed)
Rx prednisone pack  Do not take ibuprofen with this  Rx tizanidine 1/2 tablet prn muscle spasm  Heat/ice to site. Lidocaine patches Discussed stretching exercises

## 2023-04-12 NOTE — Assessment & Plan Note (Signed)
Continue calcium and vitamin D daily.  

## 2023-04-14 ENCOUNTER — Other Ambulatory Visit: Payer: Self-pay | Admitting: *Deleted

## 2023-04-14 DIAGNOSIS — Z122 Encounter for screening for malignant neoplasm of respiratory organs: Secondary | ICD-10-CM

## 2023-04-14 DIAGNOSIS — F17210 Nicotine dependence, cigarettes, uncomplicated: Secondary | ICD-10-CM

## 2023-04-14 DIAGNOSIS — Z87891 Personal history of nicotine dependence: Secondary | ICD-10-CM

## 2023-05-06 ENCOUNTER — Other Ambulatory Visit: Payer: Self-pay | Admitting: Family

## 2023-05-06 DIAGNOSIS — M5441 Lumbago with sciatica, right side: Secondary | ICD-10-CM

## 2023-05-26 ENCOUNTER — Ambulatory Visit (INDEPENDENT_AMBULATORY_CARE_PROVIDER_SITE_OTHER): Payer: Medicare HMO | Admitting: Acute Care

## 2023-05-26 ENCOUNTER — Encounter: Payer: Self-pay | Admitting: Acute Care

## 2023-05-26 DIAGNOSIS — F17210 Nicotine dependence, cigarettes, uncomplicated: Secondary | ICD-10-CM

## 2023-05-26 NOTE — Progress Notes (Signed)
Virtual Visit via Telephone Note  I connected with Frankye Falla Dhaliwal on 05/26/23 at  9:00 AM EDT by telephone and verified that I am speaking with the correct person using two identifiers.  Location: Patient:  At home Provider:  [address], [place], [place], [address]    I discussed the limitations, risks, security and privacy concerns of performing an evaluation and management service by telephone and the availability of in person appointments. I also discussed with the patient that there may be a patient responsible charge related to this service. The patient expressed understanding and agreed to proceed.   Shared Decision Making Visit Lung Cancer Screening Program [phone])   Eligibility: Age 62 y.o. Pack Years Smoking History Calculation 49 pack year smoking history (# packs/per year x # years smoked) Recent History of coughing up blood  no Unexplained weight loss? no ( >Than 15 pounds within the last 6 months ) Prior History Lung / other cancer no (Diagnosis within the last 5 years already requiring surveillance chest CT Scans). Smoking Status Current Smoker Former Smokers: Years since quit:  NA  Quit Date: NA  Visit Components: Discussion included one or more decision making aids. yes Discussion included risk/benefits of screening. yes Discussion included potential follow up diagnostic testing for abnormal scans. yes Discussion included meaning and risk of over diagnosis. yes Discussion included meaning and risk of False Positives. yes Discussion included meaning of total radiation exposure. yes  Counseling Included: Importance of adherence to annual lung cancer LDCT screening. yes Impact of comorbidities on ability to participate in the program. yes Ability and willingness to under diagnostic treatment. yes  Smoking Cessation Counseling: Current Smokers:  Discussed importance of smoking cessation. yes Information about tobacco cessation classes and  interventions provided to patient. yes Patient provided with "ticket" for LDCT Scan. yes Symptomatic Patient. no  Counseling NA Diagnosis Code: Tobacco Use Z72.0 Asymptomatic Patient yes  Counseling (Intermediate counseling: > three minutes counseling) [id number] Former Smokers:  Discussed the importance of maintaining cigarette abstinence. yes Diagnosis Code: Personal History of Nicotine Dependence. O13.086 Information about tobacco cessation classes and interventions provided to patient. Yes Patient provided with "ticket" for LDCT Scan. yes Written Order for Lung Cancer Screening with LDCT placed in Epic. Yes (CT Chest Lung Cancer Screening Low Dose W/O CM) [id number] Z12.2-Screening of respiratory organs Z87.891-Personal history of nicotine dependence  I have spent 25 minutes of face to face/ virtual visit   time with Ms. Pryde discussing the risks and benefits of lung cancer screening. We viewed / discussed a power point together that explained in detail the above noted topics. We paused at intervals to allow for questions to be asked and answered to ensure understanding.We discussed that the single most powerful action that she can take to decrease her risk of developing lung cancer is to quit smoking. We discussed whether or not she is ready to commit to setting a quit date. We discussed options for tools to aid in quitting smoking including nicotine replacement therapy, non-nicotine medications, support groups, Quit Smart classes, and behavior modification. We discussed that often times setting smaller, more achievable goals, such as eliminating 1 cigarette a day for a week and then 2 cigarettes a day for a week can be helpful in slowly decreasing the number of cigarettes smoked. This allows for a sense of accomplishment as well as providing a clinical benefit. I provided  her  with smoking cessation  information  with contact information for community resources, classes, free  nicotine replacement  therapy, and access to mobile apps, text messaging, and on-line smoking cessation help. I have also provided  her  the office contact information in the event she needs to contact me, or the screening staff. We discussed the time and location of the scan, and that either Abigail Miyamoto RN, Karlton Lemon, RN  or I will call / send a letter with the results within 24-72 hours of receiving them. The patient verbalized understanding of all of  the above and had no further questions upon leaving the office. They have my contact information in the event they have any further questions.  I spent 3 minutes counseling on smoking cessation and the health risks of continued tobacco abuse.  I explained to the patient that there has been a high incidence of coronary artery disease noted on these exams. I explained that this is a non-gated exam therefore degree or severity cannot be determined. This patient is on statin therapy. I have asked the patient to follow-up with their PCP regarding any incidental finding of coronary artery disease and management with diet or medication as their PCP  feels is clinically indicated. The patient verbalized understanding of the above and had no further questions upon completion of the visit.      Bevelyn Ngo, NP 05/26/2023

## 2023-05-26 NOTE — Patient Instructions (Signed)

## 2023-05-27 ENCOUNTER — Encounter (INDEPENDENT_AMBULATORY_CARE_PROVIDER_SITE_OTHER): Payer: Self-pay

## 2023-05-27 ENCOUNTER — Ambulatory Visit
Admission: RE | Admit: 2023-05-27 | Discharge: 2023-05-27 | Disposition: A | Discharge status: Home / Self Care | Payer: Medicare HMO | Source: Ambulatory Visit | Attending: Acute Care | Admitting: Acute Care

## 2023-05-27 DIAGNOSIS — Z122 Encounter for screening for malignant neoplasm of respiratory organs: Secondary | ICD-10-CM | POA: Insufficient documentation

## 2023-05-27 DIAGNOSIS — F17210 Nicotine dependence, cigarettes, uncomplicated: Secondary | ICD-10-CM | POA: Insufficient documentation

## 2023-05-27 DIAGNOSIS — Z87891 Personal history of nicotine dependence: Secondary | ICD-10-CM | POA: Diagnosis not present

## 2023-06-16 DIAGNOSIS — H33321 Round hole, right eye: Secondary | ICD-10-CM | POA: Diagnosis not present

## 2023-06-29 ENCOUNTER — Telehealth: Payer: Self-pay | Admitting: Family

## 2023-06-29 NOTE — Telephone Encounter (Signed)
Attempted to schedule f/u as directed by Tabitha, explained that labs would be done after visit. Patient was wondering why she needed to follow up? Patient says hr last directions were to return for CPE in January, and wanted to know why he needed to come back especially if there were no labs to be done and follow up on.

## 2023-06-29 NOTE — Telephone Encounter (Signed)
Called patent back. Reviewed information have scheduled for cpe with patient.

## 2023-06-29 NOTE — Telephone Encounter (Signed)
Please explain to pt she is due for a follow up it will be her annual physical. However we do labs same day rather than prior to visit so does not need lab visit prior to visit. She is due for a f/u and we need to recheck her cholesterol.

## 2023-07-08 ENCOUNTER — Ambulatory Visit: Payer: Medicare HMO

## 2023-07-08 ENCOUNTER — Other Ambulatory Visit: Payer: Medicare HMO

## 2023-07-12 ENCOUNTER — Ambulatory Visit (INDEPENDENT_AMBULATORY_CARE_PROVIDER_SITE_OTHER): Payer: Medicare HMO | Admitting: Family

## 2023-07-12 VITALS — BP 108/64 | HR 83 | Temp 97.6°F | Ht 59.5 in | Wt 112.0 lb

## 2023-07-12 DIAGNOSIS — F419 Anxiety disorder, unspecified: Secondary | ICD-10-CM | POA: Diagnosis not present

## 2023-07-12 DIAGNOSIS — Z0001 Encounter for general adult medical examination with abnormal findings: Secondary | ICD-10-CM | POA: Diagnosis not present

## 2023-07-12 DIAGNOSIS — Z720 Tobacco use: Secondary | ICD-10-CM

## 2023-07-12 DIAGNOSIS — R911 Solitary pulmonary nodule: Secondary | ICD-10-CM

## 2023-07-12 DIAGNOSIS — L989 Disorder of the skin and subcutaneous tissue, unspecified: Secondary | ICD-10-CM | POA: Diagnosis not present

## 2023-07-12 DIAGNOSIS — R918 Other nonspecific abnormal finding of lung field: Secondary | ICD-10-CM

## 2023-07-12 DIAGNOSIS — F32A Depression, unspecified: Secondary | ICD-10-CM

## 2023-07-12 DIAGNOSIS — L819 Disorder of pigmentation, unspecified: Secondary | ICD-10-CM

## 2023-07-12 DIAGNOSIS — F1290 Cannabis use, unspecified, uncomplicated: Secondary | ICD-10-CM | POA: Insufficient documentation

## 2023-07-12 DIAGNOSIS — Z23 Encounter for immunization: Secondary | ICD-10-CM

## 2023-07-12 DIAGNOSIS — F17210 Nicotine dependence, cigarettes, uncomplicated: Secondary | ICD-10-CM

## 2023-07-12 DIAGNOSIS — E538 Deficiency of other specified B group vitamins: Secondary | ICD-10-CM | POA: Diagnosis not present

## 2023-07-12 DIAGNOSIS — E7800 Pure hypercholesterolemia, unspecified: Secondary | ICD-10-CM | POA: Diagnosis not present

## 2023-07-12 DIAGNOSIS — Z0000 Encounter for general adult medical examination without abnormal findings: Secondary | ICD-10-CM

## 2023-07-12 DIAGNOSIS — M85852 Other specified disorders of bone density and structure, left thigh: Secondary | ICD-10-CM

## 2023-07-12 DIAGNOSIS — J439 Emphysema, unspecified: Secondary | ICD-10-CM

## 2023-07-12 LAB — CBC WITH DIFFERENTIAL/PLATELET
Basophils Absolute: 0.1 10*3/uL (ref 0.0–0.1)
Basophils Relative: 0.8 % (ref 0.0–3.0)
Eosinophils Absolute: 0.1 10*3/uL (ref 0.0–0.7)
Eosinophils Relative: 1.8 % (ref 0.0–5.0)
HCT: 47.3 % — ABNORMAL HIGH (ref 36.0–46.0)
Hemoglobin: 15.5 g/dL — ABNORMAL HIGH (ref 12.0–15.0)
Lymphocytes Relative: 21.3 % (ref 12.0–46.0)
Lymphs Abs: 1.5 10*3/uL (ref 0.7–4.0)
MCHC: 32.7 g/dL (ref 30.0–36.0)
MCV: 92.8 fL (ref 78.0–100.0)
Monocytes Absolute: 0.6 10*3/uL (ref 0.1–1.0)
Monocytes Relative: 8.2 % (ref 3.0–12.0)
Neutro Abs: 4.7 10*3/uL (ref 1.4–7.7)
Neutrophils Relative %: 67.9 % (ref 43.0–77.0)
Platelets: 168 10*3/uL (ref 150.0–400.0)
RBC: 5.09 Mil/uL (ref 3.87–5.11)
RDW: 14 % (ref 11.5–15.5)
WBC: 7 10*3/uL (ref 4.0–10.5)

## 2023-07-12 LAB — LIPID PANEL
Cholesterol: 143 mg/dL (ref 0–200)
HDL: 54.1 mg/dL (ref >39.00)
LDL Cholesterol: 62 mg/dL (ref 0–99)
NonHDL: 88.4
Total CHOL/HDL Ratio: 3
Triglycerides: 133 mg/dL (ref 0.0–149.0)
VLDL: 26.6 mg/dL (ref 0.0–40.0)

## 2023-07-12 LAB — VITAMIN B12: Vitamin B-12: 1420 pg/mL — ABNORMAL HIGH (ref 211–911)

## 2023-07-12 MED ORDER — TRIAMCINOLONE ACETONIDE 0.1 % EX CREA
1.0000 | TOPICAL_CREAM | Freq: Two times a day (BID) | CUTANEOUS | 0 refills | Status: AC
Start: 2023-07-12 — End: ?

## 2023-07-12 MED ORDER — BUPROPION HCL ER (XL) 150 MG PO TB24
150.0000 mg | ORAL_TABLET | Freq: Every day | ORAL | 0 refills | Status: DC
Start: 2023-07-12 — End: 2023-10-17

## 2023-07-12 NOTE — Assessment & Plan Note (Signed)
Trial start bupropion 150 mg XL once daily

## 2023-07-12 NOTE — Assessment & Plan Note (Signed)
Calling radiology to confirm benign appearing 10 mm new ground glass opacity on latest CT scan for lung cancer screening. Awaiting response.

## 2023-07-12 NOTE — Assessment & Plan Note (Signed)
Continue calcium and vitamin D daily Reviewed bone density scan.  Repeat 03/2025

## 2023-07-12 NOTE — Assessment & Plan Note (Signed)
Smoking cessation instruction/counseling given:  counseled patient on the dangers of tobacco use, advised patient to stop smoking, and reviewed strategies to maximize success  Wellbutrin prescribed.

## 2023-07-12 NOTE — Assessment & Plan Note (Signed)
Patient Counseling(The following topics were reviewed):  Preventative care handout given to pt  Health maintenance and immunizations reviewed. Please refer to Health maintenance section. Pt advised on safe sex, wearing seatbelts in car, and proper nutrition labwork ordered today for annual Dental health: Discussed importance of regular tooth brushing, flossing, and dental visits.  Pt declines flu vaccine.  Prevnar 20 in office today for smoker and emphysema high risk pt  Advised will repeat when 62 y/o

## 2023-07-12 NOTE — Patient Instructions (Signed)
  Shingles vaccination: I do recommend this. You have to get this at the pharmacy due to your insurance.   Tetanus, you over due but this needs to also be at the pharmacy.    ------------------------------------   Stop by the lab prior to leaving today. I will notify you of your results once received.   Recommendations on keeping yourself healthy:  - Exercise at least 30-45 minutes a day, 3-4 days a week.  - Eat a low-fat diet with lots of fruits and vegetables, up to 7-9 servings per day.  - Seatbelts can save your life. Wear them always.  - Smoke detectors on every level of your home, check batteries every year.  - Eye Doctor - have an eye exam every 1-2 years  - Safe sex - if you may be exposed to STDs, use a condom.  - Alcohol -  If you drink, do it moderately, less than 2 drinks per day.  - Health Care Power of Attorney. Choose someone to speak for you if you are not able.  - Depression is common in our stressful world.If you're feeling down or losing interest in things you normally enjoy, please come in for a visit.  - Violence - If anyone is threatening or hurting you, please call immediately.  Due to recent changes in healthcare laws, you may see results of your imaging and/or laboratory studies on MyChart before I have had a chance to review them.  I understand that in some cases there may be results that are confusing or concerning to you. Please understand that not all results are received at the same time and often I may need to interpret multiple results in order to provide you with the best plan of care or course of treatment. Therefore, I ask that you please give me 2 business days to thoroughly review all your results before contacting my office for clarification. Should we see a critical lab result, you will be contacted sooner.   I will see you again in one year for your annual comprehensive exam unless otherwise stated and or with acute concerns.  It was a pleasure  seeing you today! Please do not hesitate to reach out with any questions and or concerns.  Regards,   Mort Sawyers

## 2023-07-12 NOTE — Assessment & Plan Note (Signed)
B12 ordered pending results.  Cont otc b12

## 2023-07-12 NOTE — Assessment & Plan Note (Addendum)
R/o melanoma, referral placed for dermatology.  Monitor site for increased change in size and or pigmentation as well.   There others, suspected BCC

## 2023-07-12 NOTE — Assessment & Plan Note (Signed)
Ordered lipid panel, pending results. Work on low cholesterol diet and exercise as tolerated Continue atorvastatin 20 mg once daily

## 2023-07-12 NOTE — Progress Notes (Signed)
Subjective:  Patient ID: [name] [name], female    DOB: [birth year]  Age: 62 y.o. MRN: [id number]  Patient Care Team: Mort Sawyers, FNP as PCP - General (Family Medicine)   CC:  Chief Complaint  Patient presents with   Annual Exam    HPI [name] [name] is a 62 y.o. female who presents today for an annual physical exam. She reports consuming a general diet. The patient does not participate in regular exercise at present. She generally feels well. She reports sleeping fairly well. She does not have additional problems to discuss today.   Vision:Within last year Dental:No regular dental care  Lung Cancer Screening with low-dose Chest CT: already part of lung cancer program screening. CT 05/27/23, emphysema. New ground glass nodule right upper lobe 10.1 mm, additional scattered new small solid nodules < 4 mm. Noted in impression to repeat CT in one year.   Mammogram:  June 2024 Last pap: h/o complete hysterectomy  Cologuard: April 2024, negative Bone density scan: osteopenia 03/2023, taking calcium and vit D Flu shot: declines today  Pneumococcal 23 vaccination, received 2021. High risk due to emphysema and smoker  Anxiety: would like to restart bupropion. Liked in the past, helped a lot. It did help her to quit smoking in the past as well.   Pt is without acute concerns.   Advanced Directives Patient does have advanced directives including living will. She does not have a copy in the electronic medical record.   DEPRESSION SCREENING    07/12/2023    7:47 AM 04/12/2023   11:50 AM 01/06/2023   11:49 AM 08/05/2022   10:58 AM 06/10/2022   11:53 AM 04/24/2022   11:36 AM 06/03/2020    3:34 PM  PHQ 2/9 Scores  PHQ - 2 Score 3 3 3  0 1 1 4   PHQ- 9 Score 8 7 7  0 4 8 7      ROS: Negative unless specifically indicated above in HPI.    Current Outpatient Medications:    atorvastatin (LIPITOR) 20 MG tablet, Take 1 tablet (20 mg total) by mouth daily., Disp: 90 tablet, Rfl: 3    buPROPion (WELLBUTRIN XL) 150 MG 24 hr tablet, Take 1 tablet (150 mg total) by mouth daily., Disp: 90 tablet, Rfl: 0   Calcium-Vitamin D 600-5 MG-MCG TABS, Take 1 tablet by mouth daily. (Patient taking differently: Take 2 tablets by mouth daily.), Disp: , Rfl:    fexofenadine (ALLEGRA) 180 MG tablet, Take 180 mg by mouth daily., Disp: , Rfl:    loratadine (CLARITIN) 10 MG tablet, Take 10 mg by mouth daily as needed for allergies., Disp: , Rfl:    tiZANidine (ZANAFLEX) 4 MG tablet, TAKE 1/2 TABLET AT BEDTIME AS NEEDED FOR MUSCLE RELAXER, Disp: 45 tablet, Rfl: 1   triamcinolone cream (KENALOG) 0.1 %, Apply 1 Application topically 2 (two) times daily., Disp: 30 g, Rfl: 0   albuterol (VENTOLIN HFA) 108 (90 Base) MCG/ACT inhaler, Inhale 1-2 puffs into the lungs every 6 (six) hours as needed for wheezing or shortness of breath. (Patient not taking: Reported on 07/12/2023), Disp: 8 g, Rfl: 3    Objective:    BP 108/64 (BP Location: Left Arm, Patient Position: Sitting, Cuff Size: Normal)   Pulse 83   Temp 97.6 F (36.4 C) (Temporal)   Ht 4' 11.5" (1.511 m)   Wt 112 lb (50.8 kg)   SpO2 97%   BMI 22.24 kg/m   BP Readings from Last 3 Encounters:  07/12/23 108/64  04/12/23 128/76  01/06/23 132/82      Physical Exam Constitutional:      General: She is not in acute distress.    Appearance: Normal appearance. She is normal weight. She is not ill-appearing.  HENT:     Head: Normocephalic.     Right Ear: Tympanic membrane normal.     Left Ear: Tympanic membrane normal.     Nose: Nose normal.     Mouth/Throat:     Mouth: Mucous membranes are moist.  Eyes:     Extraocular Movements: Extraocular movements intact.     Pupils: Pupils are equal, round, and reactive to light.  Cardiovascular:     Rate and Rhythm: Normal rate and regular rhythm.  Pulmonary:     Effort: Pulmonary effort is normal.     Breath sounds: Normal breath sounds.  Abdominal:     General: Abdomen is flat. Bowel sounds  are normal.     Palpations: Abdomen is soft.     Tenderness: There is no guarding or rebound.  Musculoskeletal:        General: Normal range of motion.     Cervical back: Normal range of motion.  Skin:    General: Skin is warm.     Capillary Refill: Capillary refill takes less than 2 seconds.     Comments: Left upper back with three moles brown pigmentation. Superior mole with slight black spot within brown pigmented mole.   Left lower abdomen with raised white pigmented lesion.  Mid chest two small raised white pigmented scaly lesions  Neurological:     General: No focal deficit present.     Mental Status: She is alert.  Psychiatric:        Mood and Affect: Mood normal.        Behavior: Behavior normal.        Thought Content: Thought content normal.        Judgment: Judgment normal.             Assessment & Plan:  Pure hypercholesterolemia Assessment & Plan: Ordered lipid panel, pending results. Work on low cholesterol diet and exercise as tolerated Continue atorvastatin 20 mg once daily  Orders: -     Lipid panel  Tobacco use -     CBC with Differential/Platelet  Osteopenia of left hip Assessment & Plan: Continue calcium and vitamin D daily Reviewed bone density scan.  Repeat 03/2025   Pulmonary emphysema, unspecified emphysema type (HCC)  Multiple pulmonary nodules Assessment & Plan: Calling radiology to confirm benign appearing 10 mm new ground glass opacity on latest CT scan for lung cancer screening. Awaiting response.    Encounter for general adult medical examination without abnormal findings Assessment & Plan: Patient Counseling(The following topics were reviewed):  Preventative care handout given to pt  Health maintenance and immunizations reviewed. Please refer to Health maintenance section. Pt advised on safe sex, wearing seatbelts in car, and proper nutrition labwork ordered today for annual Dental health: Discussed importance of regular  tooth brushing, flossing, and dental visits.  Pt declines flu vaccine.  Prevnar 20 in office today for smoker and emphysema high risk pt  Advised will repeat when 62 y/o   Orders: -     CBC with Differential/Platelet -     Pneumococcal conjugate vaccine 20-valent  B12 deficiency Assessment & Plan: B12 ordered pending results.  Cont otc b12  Orders: -     Vitamin B12  Skin lesions -     Ambulatory referral  to Dermatology -     Triamcinolone Acetonide; Apply 1 Application topically 2 (two) times daily.  Dispense: 30 g; Refill: 0  Pigmented skin lesion of suspected malignant nature Assessment & Plan: R/o melanoma, referral placed for dermatology.  Monitor site for increased change in size and or pigmentation as well.   There others, suspected BCC   Orders: -     Ambulatory referral to Dermatology  Anxiety and depression Assessment & Plan: Trial start bupropion 150 mg XL once daily  Orders: -     buPROPion HCl ER (XL); Take 1 tablet (150 mg total) by mouth daily.  Dispense: 90 tablet; Refill: 0  Primary hypercholesterolemia -     Lipid panel; Future  Cigarette smoker Assessment & Plan: Smoking cessation instruction/counseling given:  counseled patient on the dangers of tobacco use, advised patient to stop smoking, and reviewed strategies to maximize success  Wellbutrin prescribed.    Incidental pulmonary nodule, greater than or equal to 8mm  Marijuana smoker      Follow-up: Return in about 6 months (around 01/09/2024) for f/u cholesterol.   Mort Sawyers, FNP

## 2023-07-13 NOTE — Progress Notes (Signed)
B12 pretty high what are you taking for daily supplementation? Cholesterol looks great.  (Pt signed up for mychart but states she doesn't use it)

## 2023-07-14 ENCOUNTER — Telehealth: Payer: Self-pay | Admitting: Family

## 2023-07-14 DIAGNOSIS — J449 Chronic obstructive pulmonary disease, unspecified: Secondary | ICD-10-CM

## 2023-07-14 NOTE — Telephone Encounter (Signed)
Spoke to pt, told pt Dugal's response. Pt states she's taking "Nature Made B12, ". Pt asked if Dugal would refill albuterol (VENTOLIN HFA) 108 (90 Base) MCG/ACT inhaler? Call back # [phone]

## 2023-07-14 NOTE — Telephone Encounter (Signed)
error 

## 2023-07-15 MED ORDER — ALBUTEROL SULFATE HFA 108 (90 BASE) MCG/ACT IN AERS: 1.0000 | INHALATION_SPRAY | Freq: Four times a day (QID) | RESPIRATORY_TRACT | 3 refills | Status: AC | PRN

## 2023-07-15 MED ORDER — VITAMIN B-12 1000 MCG PO TABS: 1000.0000 ug | ORAL_TABLET | Freq: Every day | ORAL | Status: DC

## 2023-07-15 NOTE — Telephone Encounter (Signed)
Refill sent b12 added to med list.

## 2023-07-15 NOTE — Addendum Note (Signed)
Addended by: Mort Sawyers on: 07/15/2023 03:10 PM   Modules accepted: Orders

## 2023-07-30 ENCOUNTER — Telehealth: Payer: Self-pay | Admitting: Acute Care

## 2023-07-30 DIAGNOSIS — R911 Solitary pulmonary nodule: Secondary | ICD-10-CM

## 2023-07-30 NOTE — Telephone Encounter (Signed)
Called and left VM for patient regarding her LDCT in 05/2023 which resulted as a Lung Rads 2. There is a new nodule at 10.11mm. Because of this new nodule [name] Robinsons NP would prefer patient have a 6 month follow up scan due in 11/2023. Patient verbalized understanding and is agreeable to this. Order placed for 6 month scan . Will try pt back to relay results and plan.

## 2023-08-02 NOTE — Telephone Encounter (Signed)
Patient returned call. Spoke with patient regarding results. Informed her of the reason we held the scan for additional review due to the 10mm nodule. Informed her that Kandice Robinsons NP has felt it appropriate to have pt do a 6 month follow up scan instead of a 12 month scan. Patient verbalized understanding. Results sent to PCP with plan and order placed for follow up scan.

## 2023-08-03 NOTE — Telephone Encounter (Signed)
Thanks for the update and noted.  Patient is currently on atorvastatin 20 mg with LDL less than 70.  Will follow

## 2023-08-09 ENCOUNTER — Ambulatory Visit (INDEPENDENT_AMBULATORY_CARE_PROVIDER_SITE_OTHER): Payer: Medicare HMO

## 2023-08-09 VITALS — Ht 59.0 in | Wt 112.0 lb

## 2023-08-09 DIAGNOSIS — Z0000 Encounter for general adult medical examination without abnormal findings: Secondary | ICD-10-CM

## 2023-08-09 NOTE — Patient Instructions (Signed)
[name] [name] , Thank you for taking time to come for your Medicare Wellness Visit. I appreciate your ongoing commitment to your health goals. Please review the following plan we discussed and let me know if I can assist you in the future.   Referrals/Orders/Follow-Ups/Clinician Recommendations: none  This is a list of the screening recommended for you and due dates:  Health Maintenance  Topic Date Due   Zoster (Shingles) Vaccine (1 of 2) 10/11/2023*   COVID-19 Vaccine (1) 01/06/2024*   HIV Screening  01/06/2024*   Flu Shot  01/10/2024*   Screening for Lung Cancer  05/26/2024   Medicare Annual Wellness Visit  08/08/2024   Mammogram  03/22/2025   Cologuard (Stool DNA test)  01/12/2026   Hepatitis C Screening  Completed   HPV Vaccine  Aged Out   DTaP/Tdap/Td vaccine  Discontinued  *Topic was postponed. The date shown is not the original due date.    Advanced directives: (Declined) Advance directive discussed with you today. Even though you declined this today, please call our office should you change your mind, and we can give you the proper paperwork for you to fill out.  Next Medicare Annual Wellness Visit scheduled for next year: Yes 08/10/24 @ 3:40pm

## 2023-08-09 NOTE — Progress Notes (Signed)
Subjective:   [name] [name] is a 62 y.o. female who presents for Medicare Annual (Subsequent) preventive examination.  Visit Complete: Virtual I connected with  Naarah Cottongim Connett on 08/09/23 by a audio enabled telemedicine application and verified that I am speaking with the correct person using two identifiers.  Patient Location: Home  Provider Location: Office/Clinic  I discussed the limitations of evaluation and management by telemedicine. The patient expressed understanding and agreed to proceed.  Vital Signs: Because this visit was a virtual/telehealth visit, some criteria may be missing or patient reported. Any vitals not documented were not able to be obtained and vitals that have been documented are patient reported.  Patient Medicare AWV questionnaire was completed by the patient on (not done); I have confirmed that all information answered by patient is correct and no changes since this date. Cardiac Risk Factors include: advanced age (>47men, >69 women);dyslipidemia;hypertension;sedentary lifestyle;smoking/ tobacco exposure    Objective:    Today's Vitals   08/09/23 1450 08/09/23 1454  Weight: 112 lb (50.8 kg)   Height: 4\' 11"  (1.499 m)   PainSc:  2    Body mass index is 22.62 kg/m.     08/09/2023    3:09 PM 08/05/2022   11:01 AM 12/27/2015    5:46 PM 12/20/2015    9:54 AM  Advanced Directives  Does Patient Have a Medical Advance Directive? No No No No  Would patient like information on creating a medical advance directive?  No - Patient declined No - patient declined information No - patient declined information    Current Medications (verified) Outpatient Encounter Medications as of 08/09/2023  Medication Sig   albuterol (VENTOLIN HFA) 108 (90 Base) MCG/ACT inhaler Inhale 1-2 puffs into the lungs every 6 (six) hours as needed for wheezing or shortness of breath.   atorvastatin (LIPITOR) 20 MG tablet Take 1 tablet (20 mg total) by mouth daily.   buPROPion  (WELLBUTRIN XL) 150 MG 24 hr tablet Take 1 tablet (150 mg total) by mouth daily.   Calcium-Vitamin D 600-5 MG-MCG TABS Take 1 tablet by mouth daily. (Patient taking differently: Take 2 tablets by mouth daily.)   fexofenadine (ALLEGRA) 180 MG tablet Take 180 mg by mouth daily.   loratadine (CLARITIN) 10 MG tablet Take 10 mg by mouth daily as needed for allergies.   tiZANidine (ZANAFLEX) 4 MG tablet TAKE 1/2 TABLET AT BEDTIME AS NEEDED FOR MUSCLE RELAXER   triamcinolone cream (KENALOG) 0.1 % Apply 1 Application topically 2 (two) times daily.   cyanocobalamin (VITAMIN B12) 1000 MCG tablet Take 1 tablet (1,000 mcg total) by mouth daily. (Patient not taking: Reported on 08/09/2023)   No facility-administered encounter medications on file as of 08/09/2023.    Allergies (verified) Codeine   History: Past Medical History:  Diagnosis Date   Anxiety    COPD (chronic obstructive pulmonary disease) (HCC)    Depression    Fibroids    Uterine   HLD (hyperlipidemia)    Major depressive disorder, recurrent (HCC) 08/27/2014   OA (osteoarthritis)    Tobacco abuse    Vitiligo 1975   Wears glasses    Past Surgical History:  Procedure Laterality Date   DILATION AND CURETTAGE OF UTERUS     HIP ARTHROSCOPY  10/12/2001   right    LAPAROSCOPIC TOTAL HYSTERECTOMY  10/12/2009   MANDIBLE SURGERY  10/12/1978   TOTAL HIP ARTHROPLASTY Right 12/27/2015   Procedure: RIGHT TOTAL HIP ARTHROPLASTY ANTERIOR APPROACH;  Surgeon: Kathryne Hitch, MD;  Location: [facility];  Service: Orthopedics;  Laterality: Right;   TUBAL LIGATION     URETER SURGERY  10/12/2009   Family History  Problem Relation Age of Onset   Diabetes Father    Lung cancer Father        smoker   Miscarriages / [place] Sister    Lung cancer Sister        smoker   Depression Maternal Grandmother    Lung cancer Niece        non smoker, lung liver kidney   Breast cancer Neg Hx    Social History   Socioeconomic History   Marital  status: Married    Spouse name: Christiane Ha   Number of children: 3   Years of education: GED   Highest education level: GED or equivalent  Occupational History   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[employer]  Tobacco Use   Smoking status: Every Day    Current packs/day: 1.00    Average packs/day: 1 pack/day for 50.0 years (50.0 ttl pk-yrs)    Types: Cigarettes   Smokeless tobacco: Never   Tobacco comments:    recently quit 04/2018  Vaping Use   Vaping status: Never Used  Substance and Sexual Activity   Alcohol use: No    Comment: history of alcoholism quit 16 years ago    Drug use: Yes    Types: Marijuana    Comment: Marijuana (uses daily)   Sexual activity: Yes    Partners: Male    Birth control/protection: Surgical  Other Topics Concern   Not on file  Social History Narrative   06/03/20   From: [place] but in [place] since 1988   Living: with Christiane Ha (1991), husband   Work: on disability due to cervical and lumbar pain      Family: 2 daughters - Marcelino Duster and Darel Hong and 1 son - Ivin Booty -- has 8 grandchildren      Enjoys: not sure, camping      Exercise: not currently due to chronic pain   Diet: some days good, other days not good - more junk food than real food      Safety   Seat belts: Yes    Guns: No   Safe in relationships: Yes    Social Determinants of Health   Financial Resource Strain: Low Risk  (08/09/2023)   Overall Financial Resource Strain (CARDIA)    Difficulty of Paying Living Expenses: Not very hard  Food Insecurity: No Food Insecurity (08/09/2023)   Hunger Vital Sign    Worried About Running Out of Food in the Last Year: Never true    Ran Out of Food in the Last Year: Never true  Transportation Needs: No Transportation Needs (08/09/2023)   PRAPARE - Administrator, Civil Service (Medical): No    Lack of Transportation (Non-Medical): No  Physical Activity: Inactive (08/09/2023)   Exercise Vital Sign    Days of Exercise per Week: 0 days    Minutes of Exercise  per Session: 0 min  Stress: No Stress Concern Present (08/09/2023)   [employer] of Occupational Health - Occupational Stress Questionnaire    Feeling of Stress : Not at all  Social Connections: Unknown (08/09/2023)   Social Connection and Isolation Panel [NHANES]    Frequency of Communication with Friends and Family: Three times a week    Frequency of Social Gatherings with Friends and Family: Twice a week    Attends Religious Services: Patient declined    Active Member of Clubs or  Organizations: No    Attends Banker Meetings: Never    Marital Status: Married    Tobacco Counseling Ready to quit: No Counseling given: Not Answered Tobacco comments: recently quit 04/2018   Clinical Intake:  Pre-visit preparation completed: No  Pain : 0-10 Pain Score: 2  Pain Type: Chronic pain Pain Location: Back (head aches) Pain Orientation: Lower, Mid Pain Descriptors / Indicators: Aching Pain Onset: More than a month ago Pain Frequency: Intermittent Pain Relieving Factors: heat pad and ice,dry needling  Pain Relieving Factors: heat pad and ice,dry needling  BMI - recorded: 22.62 Nutritional Status: BMI of 19-24  Normal Nutritional Risks: None Diabetes: No  How often do you need to have someone help you when you read instructions, pamphlets, or other written materials from your doctor or pharmacy?: 1 - Never  Interpreter Needed?: No  Comments: lives with spouse Information entered by :: B.Darianna Amy,LPN   Activities of Daily Living    08/09/2023    3:09 PM  In your present state of health, do you have any difficulty performing the following activities:  Hearing? 0  Vision? 0  Difficulty concentrating or making decisions? 0  Walking or climbing stairs? 0  Dressing or bathing? 0  Doing errands, shopping? 0  Preparing Food and eating ? N  Using the Toilet? N  In the past six months, have you accidently leaked urine? N  Do you have problems with loss of  bowel control? N  Managing your Medications? N  Managing your Finances? N  Housekeeping or managing your Housekeeping? N    Patient Care Team: Mort Sawyers, FNP as PCP - General (Family Medicine)  Indicate any recent Medical Services you may have received from other than [facility] providers in the past year (date may be approximate).     Assessment:   This is a routine wellness examination for [place].  Hearing/Vision screen Hearing Screening - Comments:: Pt says she has no problems with hearing Vision Screening - Comments:: Pt says she sees well with glasses Dr Edger House    Goals Addressed   None    Depression Screen    08/09/2023    3:04 PM 07/12/2023    7:47 AM 04/12/2023   11:50 AM 01/06/2023   11:49 AM 08/05/2022   10:58 AM 06/10/2022   11:53 AM 04/24/2022   11:36 AM  PHQ 2/9 Scores  PHQ - 2 Score 2 3 3 3  0 1 1  PHQ- 9 Score 5 8 7 7  0 4 8    Fall Risk    08/09/2023    2:59 PM 07/12/2023    7:46 AM 04/12/2023   10:59 AM 01/06/2023   11:49 AM 08/05/2022   11:00 AM  Fall Risk   Falls in the past year? 0 0 0 0 0  Number falls in past yr: 0 0 0 0 0  Injury with Fall? 0 0 0 0 0  Risk for fall due to : No Fall Risks No Fall Risks No Fall Risks  No Fall Risks  Follow up Education provided;Falls prevention discussed Falls evaluation completed Falls evaluation completed Falls evaluation completed;Education provided;Falls prevention discussed Falls prevention discussed    MEDICARE RISK AT HOME: Medicare Risk at Home Any stairs in or around the home?: Yes If so, are there any without handrails?: Yes Home free of loose throw rugs in walkways, pet beds, electrical cords, etc?: Yes Adequate lighting in your home to reduce risk of falls?: Yes Life alert?: No Use of  a cane, walker or w/c?: No Grab bars in the bathroom?: No Shower chair or bench in shower?: No Elevated toilet seat or a handicapped toilet?: No  TIMED UP AND GO:  Was the test performed?  No    Cognitive  Function:        08/09/2023    3:12 PM 08/05/2022   11:01 AM  6CIT Screen  What Year? 0 points 0 points  What month? 0 points 0 points  What time? 0 points 0 points  Count back from 20 0 points 0 points  Months in reverse 0 points 0 points  Repeat phrase 0 points 0 points  Total Score 0 points 0 points    Immunizations Immunization History  Administered Date(s) Administered   Influenza,inj,Quad PF,6+ Mos 08/23/2013, 09/16/2015, 09/22/2016, 07/19/2017   PNEUMOCOCCAL CONJUGATE-20 07/12/2023   Pneumococcal Polysaccharide-23 07/19/2017   Td 02/06/2010    TDAP status: Up to date  Flu Vaccine status: Up to date PT DECLINED  Pneumococcal vaccine status: Up to date  Covid-19 vaccine status: Declined, Education has been provided regarding the importance of this vaccine but patient still declined. Advised may receive this vaccine at local pharmacy or Health Dept.or vaccine clinic. Aware to provide a copy of the vaccination record if obtained from local pharmacy or Health Dept. Verbalized acceptance and understanding.  Qualifies for Shingles Vaccine? Yes   Zostavax completed No   Shingrix Completed?: No.    Education has been provided regarding the importance of this vaccine. Patient has been advised to call insurance company to determine out of pocket expense if they have not yet received this vaccine. Advised may also receive vaccine at local pharmacy or Health Dept. Verbalized acceptance and understanding.  Screening Tests Health Maintenance  Topic Date Due   Zoster Vaccines- Shingrix (1 of 2) 10/11/2023 (Originally 06/16/1980)   COVID-19 Vaccine (1) 01/06/2024 (Originally 06/16/1966)   HIV Screening  01/06/2024 (Originally 06/16/1976)   INFLUENZA VACCINE  01/10/2024 (Originally 05/13/2023)   Lung Cancer Screening  05/26/2024   Medicare Annual Wellness (AWV)  08/08/2024   MAMMOGRAM  03/22/2025   Fecal DNA (Cologuard)  01/12/2026   Hepatitis C Screening  Completed   HPV VACCINES   Aged Out   DTaP/Tdap/Td  Discontinued    Health Maintenance  There are no preventive care reminders to display for this patient.   Colorectal cancer screening: Type of screening: Cologuard. Completed 01/13/23. Repeat every 3 years  Mammogram status: Completed 03/22/24. Repeat every year  Lung Cancer Screening: (Low Dose CT Chest recommended if Age 38-80 years, 20 pack-year currently smoking OR have quit w/in 15years.) does not qualify.   Lung Cancer Screening Referral: NO ALREADY DONE this year:says has to rtn in 6months  Additional Screening:  Hepatitis C Screening: does not qualify; Completed 05/19/18  Vision Screening: Recommended annual ophthalmology exams for early detection of glaucoma and other disorders of the eye. Is the patient up to date with their annual eye exam?  Yes  Who is the provider or what is the name of the office in which the patient attends annual eye exams? Dr Larence Penning If pt is not established with a provider, would they like to be referred to a provider to establish care? No .   Dental Screening: Recommended annual dental exams for proper oral hygiene  Diabetic Foot Exam:   Community Resource Referral / Chronic Care Management: CRR required this visit?  No   CCM required this visit?  No   Plan:  I have personally reviewed and noted the following in the patient's chart:   Medical and social history Use of alcohol, tobacco or illicit drugs  Current medications and supplements including opioid prescriptions. Patient is not currently taking opioid prescriptions. Functional ability and status Nutritional status Physical activity Advanced directives List of other physicians Hospitalizations, surgeries, and ER visits in previous 12 months Vitals Screenings to include cognitive, depression, and falls Referrals and appointments  In addition, I have reviewed and discussed with patient certain preventive protocols, quality metrics, and best practice  recommendations. A written personalized care plan for preventive services as well as general preventive health recommendations were provided to patient.    ASHARA PORTZ, LPN   [id number]   After Visit Summary: (MyChart) Due to this being a telephonic visit, the after visit summary with patients personalized plan was offered to patient via MyChart   Nurse Notes: The patient states she is doing well and has no concerns or questions at this time.

## 2023-08-26 ENCOUNTER — Encounter: Payer: Self-pay | Admitting: Dermatology

## 2023-08-26 ENCOUNTER — Ambulatory Visit: Payer: Medicare HMO | Admitting: Dermatology

## 2023-08-26 DIAGNOSIS — D224 Melanocytic nevi of scalp and neck: Secondary | ICD-10-CM

## 2023-08-26 DIAGNOSIS — L821 Other seborrheic keratosis: Secondary | ICD-10-CM | POA: Diagnosis not present

## 2023-08-26 DIAGNOSIS — D229 Melanocytic nevi, unspecified: Secondary | ICD-10-CM

## 2023-08-26 DIAGNOSIS — L820 Inflamed seborrheic keratosis: Secondary | ICD-10-CM

## 2023-08-26 NOTE — Progress Notes (Signed)
   New Patient Visit   Subjective  [name] [name] is a 62 y.o. female who presents for the following: check growths face, neck, abdomen, no hx of skin cancer, no fhx of skin cancer. The patient has spots, moles and lesions to be evaluated, some may be new or changing and the patient may have concern these could be cancer.  New patient referral from Mort Sawyers, FNP.  The following portions of the chart were reviewed this encounter and updated as appropriate: medications, allergies, medical history  Review of Systems:  No other skin or systemic complaints except as noted in HPI or Assessment and Plan.  Objective  Well appearing patient in no apparent distress; mood and affect are within normal limits.   A focused examination was performed of the following areas: Face, neck, abdomen, back  Relevant exam findings are noted in the Assessment and Plan.    Assessment & Plan   SEBORRHEIC KERATOSIS - Stuck-on, waxy, tan-brown papules and/or plaques on left neck and abdomen - Benign-appearing - Discussed benign etiology and prognosis. - Observe - Call for any changes  MELANOCYTIC NEVI Exam: Tan-brown and/or pink-flesh-colored symmetric papules on left neck  Treatment Plan: Benign appearing on exam today. Recommend observation. Call clinic for new or changing moles. Recommend daily use of broad spectrum spf 30+ sunscreen to sun-exposed areas.  Patient declined full body skin check RTC if any lesions grow, become dark, bleed, hurt, are asymmetric Seborrheic keratoses  Multiple benign nevi  Inflamed seborrheic keratosis    Return if symptoms worsen or fail to improve.  I, Ardis Rowan, RMA, am acting as scribe for Elie Goody, MD .   Documentation: I have reviewed the above documentation for accuracy and completeness, and I agree with the above.  Elie Goody, MD

## 2023-08-26 NOTE — Patient Instructions (Addendum)

## 2023-08-30 ENCOUNTER — Other Ambulatory Visit (INDEPENDENT_AMBULATORY_CARE_PROVIDER_SITE_OTHER): Payer: Medicare HMO

## 2023-08-30 ENCOUNTER — Ambulatory Visit: Payer: Medicare HMO | Admitting: Physician Assistant

## 2023-08-30 ENCOUNTER — Encounter: Payer: Self-pay | Admitting: Physician Assistant

## 2023-08-30 DIAGNOSIS — G8929 Other chronic pain: Secondary | ICD-10-CM | POA: Diagnosis not present

## 2023-08-30 DIAGNOSIS — M25512 Pain in left shoulder: Secondary | ICD-10-CM

## 2023-08-30 MED ORDER — LIDOCAINE HCL 1 % IJ SOLN
3.0000 mL | INTRAMUSCULAR | Status: AC | PRN
Start: 2023-08-30 — End: 2023-08-30
  Administered 2023-08-30: 3 mL

## 2023-08-30 MED ORDER — METHYLPREDNISOLONE ACETATE 40 MG/ML IJ SUSP
40.0000 mg | INTRAMUSCULAR | Status: AC | PRN
  Administered 2023-08-30: 40 mg via INTRA_ARTICULAR

## 2023-08-30 NOTE — Progress Notes (Signed)
HPI: [name] comes in today due to left shoulder pain has been ongoing for the past 6 months.  No known injury.  She describes no numbness or tingling.  Pain radiates from the left shoulder down to the elbow.  Pain is worse with reaching behind her and abduction.  She is having no pain in her right shoulder.  Review of systems: Negative for fevers chills.  She is nondiabetic.  Physical exam: General Well-developed well-nourished female in no acute distress mood affect appropriate.  Ambulates without any assistive device. Respirations: Unlabored Psych: Alert and oriented x 3 Bilateral shoulders nontender over the medial borders of the scapula bilaterally.  Impingement testing positive on the left negative on the right.  Good range of motion of both shoulders with full overhead activity actively.  5 out of 5 strength external and internal rotation against resistance.  Empty can test is negative bilaterally.  Liftoff test negative bilaterally but causes significant pain left shoulder.  Radiographs: 3 views left shoulder: Shoulder is well located.  No acute fractures.  No arthropathy.  No acute findings.  Impression: Left shoulder impingement  Plan: Will have her follow-up as needed pain persist or becomes worse she will follow-up .  Questions were encouraged and answered.      Procedure Note  Patient: [name] [name]             Date of Birth: October 10, 1961           MRN: [id number]             Visit Date: 08/30/2023  Procedures: Visit Diagnoses:  1. Chronic left shoulder pain     Large Joint Inj: L subacromial bursa on 08/30/2023 12:15 PM Indications: pain Details: 22 G 1.5 in needle, superior approach  Arthrogram: No  Medications: 3 mL lidocaine 1 %; 40 mg methylPREDNISolone acetate 40 MG/ML Outcome: tolerated well, no immediate complications Procedure, treatment alternatives, risks and benefits explained, specific risks discussed. Consent was given by the patient. Immediately  prior to procedure a time out was called to verify the correct patient, procedure, equipment, support staff and site/side marked as required. Patient was prepped and draped in the usual sterile fashion.

## 2023-10-17 ENCOUNTER — Other Ambulatory Visit: Payer: Self-pay | Admitting: Family

## 2023-10-17 DIAGNOSIS — F32A Depression, unspecified: Secondary | ICD-10-CM

## 2023-11-16 ENCOUNTER — Telehealth: Payer: Self-pay

## 2023-11-16 NOTE — Telephone Encounter (Signed)
LVM, advised due to insurance request, CT has been rescheduled at [facility] on 11/29/2023 at 9:50am. [facility] is [facility]ON.

## 2023-11-29 ENCOUNTER — Ambulatory Visit
Admission: RE | Admit: 2023-11-29 | Discharge: 2023-11-29 | Disposition: A | Discharge status: Home / Self Care | Payer: Medicare HMO | Source: Ambulatory Visit | Attending: Acute Care | Admitting: Acute Care

## 2023-11-29 DIAGNOSIS — F17210 Nicotine dependence, cigarettes, uncomplicated: Secondary | ICD-10-CM | POA: Diagnosis not present

## 2023-11-29 DIAGNOSIS — R911 Solitary pulmonary nodule: Secondary | ICD-10-CM

## 2023-11-29 DIAGNOSIS — I700 Atherosclerosis of aorta: Secondary | ICD-10-CM | POA: Diagnosis not present

## 2023-11-29 DIAGNOSIS — I2510 Atherosclerotic heart disease of native coronary artery without angina pectoris: Secondary | ICD-10-CM | POA: Diagnosis not present

## 2023-12-16 ENCOUNTER — Telehealth: Payer: Self-pay | Admitting: *Deleted

## 2023-12-16 DIAGNOSIS — H33321 Round hole, right eye: Secondary | ICD-10-CM | POA: Diagnosis not present

## 2023-12-16 DIAGNOSIS — H524 Presbyopia: Secondary | ICD-10-CM | POA: Diagnosis not present

## 2023-12-16 DIAGNOSIS — H2513 Age-related nuclear cataract, bilateral: Secondary | ICD-10-CM | POA: Diagnosis not present

## 2023-12-16 NOTE — Telephone Encounter (Signed)
  IMPRESSION: 1. Lung-RADS 2, benign appearance or behavior. Continue annual screening with low-dose chest CT without contrast in 12 months. 2. Borderline left axillary adenopathy, new since 05/27/2023. Consider correlation with physical exam; negative mammogram 03/23/2023. Recent vaccination could also have this appearance. 3. Age advanced coronary artery atherosclerosis. Recommend assessment of coronary risk factors. 4. Aortic atherosclerosis (ICD10-I70.0) and emphysema (ICD10-J43.9)  Left VM for pt to discuss CT results.

## 2023-12-22 ENCOUNTER — Other Ambulatory Visit: Payer: Self-pay

## 2023-12-22 ENCOUNTER — Telehealth: Payer: Self-pay | Admitting: Acute Care

## 2023-12-22 DIAGNOSIS — Z87891 Personal history of nicotine dependence: Secondary | ICD-10-CM

## 2023-12-22 DIAGNOSIS — Z122 Encounter for screening for malignant neoplasm of respiratory organs: Secondary | ICD-10-CM

## 2023-12-22 DIAGNOSIS — F17210 Nicotine dependence, cigarettes, uncomplicated: Secondary | ICD-10-CM

## 2023-12-22 NOTE — Telephone Encounter (Signed)
 See provider note 12/22/2023

## 2023-12-22 NOTE — Telephone Encounter (Signed)
 Called and spoke with patient. Reviewed results below. She is taking her statin daily. She has not recently had a vaccination. Advises she will f/u with PCP Dugal, NP for mammogram. No questions. Annual CT order will be placed. Results and plan sent to PCP.

## 2023-12-22 NOTE — Telephone Encounter (Signed)
 Please call patient, she needs an annual screening in 12 months. She needs to see her PCP for evaluation of the left axillary lymph node.   Borderline left axillary adenopathy, new since 05/27/2023. Consider correlation with physical exam; negative mammogram 03/23/2023. Recent vaccination could also have this appearance.  She also has age advanced CAD, she is on statin therapy.  Thanks so much

## 2023-12-23 NOTE — Telephone Encounter (Signed)
 Spoke to pt, scheduled f/u for 12/27/23

## 2023-12-23 NOTE — Telephone Encounter (Signed)
 Can we please schedule pt for f/u appt in office?

## 2023-12-27 ENCOUNTER — Other Ambulatory Visit: Payer: Self-pay | Admitting: Family

## 2023-12-27 ENCOUNTER — Ambulatory Visit (INDEPENDENT_AMBULATORY_CARE_PROVIDER_SITE_OTHER): Admitting: Family

## 2023-12-27 VITALS — BP 136/90 | HR 74 | Temp 98.4°F | Ht 59.5 in | Wt 112.0 lb

## 2023-12-27 DIAGNOSIS — R590 Localized enlarged lymph nodes: Secondary | ICD-10-CM | POA: Diagnosis not present

## 2023-12-27 DIAGNOSIS — F32A Depression, unspecified: Secondary | ICD-10-CM

## 2023-12-27 DIAGNOSIS — R030 Elevated blood-pressure reading, without diagnosis of hypertension: Secondary | ICD-10-CM

## 2023-12-27 DIAGNOSIS — I700 Atherosclerosis of aorta: Secondary | ICD-10-CM | POA: Insufficient documentation

## 2023-12-27 DIAGNOSIS — E7800 Pure hypercholesterolemia, unspecified: Secondary | ICD-10-CM

## 2023-12-27 DIAGNOSIS — E538 Deficiency of other specified B group vitamins: Secondary | ICD-10-CM | POA: Diagnosis not present

## 2023-12-27 DIAGNOSIS — G8929 Other chronic pain: Secondary | ICD-10-CM | POA: Insufficient documentation

## 2023-12-27 DIAGNOSIS — Z720 Tobacco use: Secondary | ICD-10-CM | POA: Insufficient documentation

## 2023-12-27 DIAGNOSIS — N1831 Chronic kidney disease, stage 3a: Secondary | ICD-10-CM | POA: Diagnosis not present

## 2023-12-27 DIAGNOSIS — M79645 Pain in left finger(s): Secondary | ICD-10-CM | POA: Insufficient documentation

## 2023-12-27 DIAGNOSIS — N644 Mastodynia: Secondary | ICD-10-CM | POA: Insufficient documentation

## 2023-12-27 DIAGNOSIS — M25512 Pain in left shoulder: Secondary | ICD-10-CM

## 2023-12-27 DIAGNOSIS — F419 Anxiety disorder, unspecified: Secondary | ICD-10-CM

## 2023-12-27 DIAGNOSIS — F17210 Nicotine dependence, cigarettes, uncomplicated: Secondary | ICD-10-CM

## 2023-12-27 DIAGNOSIS — N6012 Diffuse cystic mastopathy of left breast: Secondary | ICD-10-CM | POA: Insufficient documentation

## 2023-12-27 LAB — CBC WITH DIFFERENTIAL/PLATELET
Basophils Absolute: 0.1 10*3/uL (ref 0.0–0.1)
Basophils Relative: 1.1 % (ref 0.0–3.0)
Eosinophils Absolute: 0.1 10*3/uL (ref 0.0–0.7)
Eosinophils Relative: 1.6 % (ref 0.0–5.0)
HCT: 45.8 % (ref 36.0–46.0)
Hemoglobin: 15.5 g/dL — ABNORMAL HIGH (ref 12.0–15.0)
Lymphocytes Relative: 17.8 % (ref 12.0–46.0)
Lymphs Abs: 0.9 10*3/uL (ref 0.7–4.0)
MCHC: 33.7 g/dL (ref 30.0–36.0)
MCV: 92.2 fl (ref 78.0–100.0)
Monocytes Absolute: 0.5 10*3/uL (ref 0.1–1.0)
Monocytes Relative: 9.1 % (ref 3.0–12.0)
Neutro Abs: 3.7 10*3/uL (ref 1.4–7.7)
Neutrophils Relative %: 70.4 % (ref 43.0–77.0)
Platelets: 136 10*3/uL — ABNORMAL LOW (ref 150.0–400.0)
RBC: 4.97 Mil/uL (ref 3.87–5.11)
RDW: 13.3 % (ref 11.5–15.5)
WBC: 5.3 10*3/uL (ref 4.0–10.5)

## 2023-12-27 LAB — BASIC METABOLIC PANEL
BUN: 10 mg/dL (ref 6–23)
CO2: 28 meq/L (ref 19–32)
Calcium: 9.6 mg/dL (ref 8.4–10.5)
Chloride: 105 meq/L (ref 96–112)
Creatinine, Ser: 1.04 mg/dL (ref 0.40–1.20)
GFR: 57.6 mL/min — ABNORMAL LOW (ref >60.00)
Glucose, Bld: 84 mg/dL (ref 70–99)
Potassium: 4.5 meq/L (ref 3.5–5.1)
Sodium: 140 meq/L (ref 135–145)

## 2023-12-27 LAB — LIPID PANEL
Cholesterol: 159 mg/dL (ref 0–200)
HDL: 53.6 mg/dL (ref >39.00)
LDL Cholesterol: 85 mg/dL (ref 0–99)
NonHDL: 104.94
Total CHOL/HDL Ratio: 3
Triglycerides: 99 mg/dL (ref 0.0–149.0)
VLDL: 19.8 mg/dL (ref 0.0–40.0)

## 2023-12-27 LAB — VITAMIN B12: Vitamin B-12: 414 pg/mL (ref 211–911)

## 2023-12-27 MED ORDER — BUPROPION HCL ER (SR) 150 MG PO TB12: 150.0000 mg | ORAL_TABLET | Freq: Two times a day (BID) | ORAL | 0 refills | Status: DC

## 2023-12-27 MED ORDER — MELOXICAM 7.5 MG PO TABS: 7.5000 mg | ORAL_TABLET | Freq: Every day | ORAL | 0 refills | Status: DC

## 2023-12-27 NOTE — Assessment & Plan Note (Signed)
 Trigger finger vs arthritis Trial 2 week course meloxicam Heat/ice to site Voltaren gel prn  Tylenol prn Exercises as tolerated If no improvement refer to hand surgeon and or can talk with her orthopedist first to see if they will evaluate hand

## 2023-12-27 NOTE — Patient Instructions (Addendum)
  Trial meloxicam 7.5 mg once daily  Try some otc voltaren gel  Heat/ice to site   ------------------------------------  Stop wellbutrin 150 mg XL once daily  Start wellbutrin SR 150 mg twice daily   ------------------------------------  I have sent an electronic order over to your preferred location for the following:   [x]   Left diagnostic mammogram and left breast ultrasound  Please give this center a call to get scheduled at your convenience.  [x]   [facility]  [address] [place] [address]  [phone]  Make sure to wear two piece  clothing  No lotions powders or deodorants the day of the appointment Make sure to bring picture ID and insurance card.  Bring list of medications you are currently taking including any supplements.

## 2023-12-27 NOTE — Assessment & Plan Note (Addendum)
 Marland Kitchen

## 2023-12-27 NOTE — Assessment & Plan Note (Signed)
 B12 ordered pending results However likely will restart b12 at 1/2 dose can do 500 once daily or 1000 mg every other day

## 2023-12-27 NOTE — Assessment & Plan Note (Addendum)
 Along with tender breasts Suspect breasts r/t fibrocystic densities  U/s axillary ordered today pending results.  Cbc ordered as well

## 2023-12-27 NOTE — Progress Notes (Signed)
 Established Patient Office Visit  Subjective:      CC:  Chief Complaint  Patient presents with   Medical Management of Chronic Issues    HPI: [name] [name] is a 63 y.o. female presenting on 12/27/2023 for Medical Management of Chronic Issues  CT chest, lung screening, completed  Found to have new left axillary adenopathy new since 8/115/24 scan.  Neg mammogram 03/23/23 only with breast densities  No suspicious findings at time  Pulm nodules, em physema, right upper lobe decreased from prior, now at 5.4 mm Average other pulm nodules at 5.6 mm average   New complaints: She denies any new breast masses, no tenderness , nipple discharge and or breast changes  Chronic left shoulder pain, seeing PA gilbert clark, ongoing left shoulder pain. Has a f/u Wednesday 3/20   Left thumb, swollen for  When she tries to bend it it 'sticks' until she pops it back.  She has taken some ibuprofen with only mild relief.  It is pretty tender when she touches it or with movement.      Social history:  Relevant past medical, surgical, family and social history reviewed and updated as indicated. Interim medical history since our last visit reviewed.  Allergies and medications reviewed and updated.  DATA REVIEWED: CHART IN EPIC     ROS: Negative unless specifically indicated above in HPI.    Current Outpatient Medications:    albuterol (VENTOLIN HFA) 108 (90 Base) MCG/ACT inhaler, Inhale 1-2 puffs into the lungs every 6 (six) hours as needed for wheezing or shortness of breath., Disp: 8 g, Rfl: 3   atorvastatin (LIPITOR) 20 MG tablet, Take 1 tablet (20 mg total) by mouth daily., Disp: 90 tablet, Rfl: 3   buPROPion (WELLBUTRIN SR) 150 MG 12 hr tablet, Take 1 tablet (150 mg total) by mouth 2 (two) times daily., Disp: 180 tablet, Rfl: 0   Calcium-Vitamin D 600-5 MG-MCG TABS, Take 1 tablet by mouth daily. (Patient taking differently: Take 2 tablets by mouth daily.), Disp: , Rfl:     fexofenadine (ALLEGRA) 180 MG tablet, Take 180 mg by mouth daily., Disp: , Rfl:    loratadine (CLARITIN) 10 MG tablet, Take 10 mg by mouth daily as needed for allergies., Disp: , Rfl:    meloxicam (MOBIC) 7.5 MG tablet, Take 1 tablet (7.5 mg total) by mouth daily., Disp: 30 tablet, Rfl: 0   tiZANidine (ZANAFLEX) 4 MG tablet, TAKE 1/2 TABLET AT BEDTIME AS NEEDED FOR MUSCLE RELAXER, Disp: 45 tablet, Rfl: 1   triamcinolone cream (KENALOG) 0.1 %, Apply 1 Application topically 2 (two) times daily., Disp: 30 g, Rfl: 0      Objective:    BP (!) 136/90 (BP Location: Left Arm, Patient Position: Sitting, Cuff Size: Normal)   Pulse 74   Temp 98.4 F (36.9 C) (Temporal)   Ht 4' 11.5" (1.511 m)   Wt 112 lb (50.8 kg)   SpO2 96%   BMI 22.24 kg/m   Wt Readings from Last 3 Encounters:  12/27/23 112 lb (50.8 kg)  08/09/23 112 lb (50.8 kg)  07/12/23 112 lb (50.8 kg)    Physical Exam Constitutional:      General: She is not in acute distress.    Appearance: Normal appearance. She is normal weight. She is not ill-appearing, toxic-appearing or diaphoretic.  HENT:     Head: Normocephalic.  Cardiovascular:     Rate and Rhythm: Normal rate and regular rhythm.  Pulmonary:     Effort: Pulmonary  effort is normal.     Breath sounds: Normal breath sounds.  Chest:  Breasts:    Right: Tenderness present.     Left: Mass (several densities with tenderness) and tenderness present.  Musculoskeletal:        General: Normal range of motion.     Comments: Left thumb snuff box and anterior base of palm  Swelling noted as well  Painful rom left 1st metacarpal  Lymphadenopathy:     Upper Body:     Left upper body: Axillary adenopathy (with tenderness approx 5 mm diameter) present.  Neurological:     General: No focal deficit present.     Mental Status: She is alert and oriented to person, place, and time. Mental status is at baseline.  Psychiatric:        Mood and Affect: Mood normal.        Behavior:  Behavior normal.        Thought Content: Thought content normal.        Judgment: Judgment normal.           Assessment & Plan:  Aortic atherosclerosis (HCC)  Pure hypercholesterolemia -     Lipid panel  B12 deficiency Assessment & Plan: B12 ordered pending results However likely will restart b12 at 1/2 dose can do 500 once daily or 1000 mg every other day   Orders: -     CBC with Differential/Platelet -     Vitamin B12  Axillary lymphadenopathy Assessment & Plan: Along with tender breasts Suspect breasts r/t fibrocystic densities  U/s axillary ordered today pending results.  Cbc ordered as well   Orders: -     CBC with Differential/Platelet -     MM 3D DIAGNOSTIC MAMMOGRAM UNILATERAL LEFT BREAST; Future -     US BREAST COMPLETE UNI LEFT INC AXILLA; Future  Cigarette smoker -     buPROPion HCl ER (SR); Take 1 tablet (150 mg total) by mouth 2 (two) times daily.  Dispense: 180 tablet; Refill: 0  Anxiety and depression -     buPROPion HCl ER (SR); Take 1 tablet (150 mg total) by mouth 2 (two) times daily.  Dispense: 180 tablet; Refill: 0  Chronic left shoulder pain -     Ambulatory referral to Orthopedic Surgery  Thumb pain, left Assessment & Plan: Trigger finger vs arthritis Trial 2 week course meloxicam Heat/ice to site Voltaren gel prn  Tylenol prn Exercises as tolerated If no improvement refer to hand surgeon and or can talk with her orthopedist first to see if they will evaluate hand  Orders: -     Meloxicam; Take 1 tablet (7.5 mg total) by mouth daily.  Dispense: 30 tablet; Refill: 0 -     Ambulatory referral to Orthopedic Surgery  Stage 3a chronic kidney disease (HCC) -     Basic metabolic panel  Breast pain, left  Fibrocystic breast changes, left  Tobacco use Assessment & Plan: Smoking cessation instruction/counseling given:   Stop wellbutrin Xl 150 mg once daily Start wellbutrin SR 150 mg twice daily    White coat syndrome without  diagnosis of hypertension Assessment & Plan:       Return if symptoms worsen or fail to improve.  Mort Sawyers, MSN, APRN, FNP-C [facility]

## 2023-12-27 NOTE — Assessment & Plan Note (Signed)
 Smoking cessation instruction/counseling given:   Stop wellbutrin Xl 150 mg once daily Start wellbutrin SR 150 mg twice daily

## 2023-12-29 ENCOUNTER — Encounter: Payer: Self-pay | Admitting: Physician Assistant

## 2023-12-29 ENCOUNTER — Other Ambulatory Visit: Payer: Self-pay

## 2023-12-29 ENCOUNTER — Ambulatory Visit (INDEPENDENT_AMBULATORY_CARE_PROVIDER_SITE_OTHER): Admitting: Physician Assistant

## 2023-12-29 DIAGNOSIS — M79642 Pain in left hand: Secondary | ICD-10-CM

## 2023-12-29 DIAGNOSIS — M65312 Trigger thumb, left thumb: Secondary | ICD-10-CM

## 2023-12-29 MED ORDER — METHYLPREDNISOLONE ACETATE 40 MG/ML IJ SUSP
20.0000 mg | INTRAMUSCULAR | Status: AC | PRN
  Administered 2023-12-29: 20 mg

## 2023-12-29 MED ORDER — LIDOCAINE HCL 1 % IJ SOLN
0.5000 mL | INTRAMUSCULAR | Status: AC | PRN
  Administered 2023-12-29: .5 mL

## 2023-12-29 NOTE — Progress Notes (Signed)
   Procedure Note  Patient: [name] [name]             Date of Birth: 1961-10-02           MRN: [id number]             Visit Date: 12/29/2023 HPI: [name] comes in today due to right thumb pain and swelling for the last 4 to 6 weeks.  No known injury.  Thumb gets stuck down she is questioning if this is due to arthritis.  She has been taking meloxicam which has helped some also wearing a thumb spica brace.  No known injury.  No fevers chills.  Physical exam: General Well-developed well-nourished female no acute distress. Left hand no rashes skin lesions ulcerations.  Sensation grossly intact.  Triggering of the left thumb at the A1 pulley.  There is a palpable nodule at the A1 pulley is tender to touch.  Radiographs: 3 views left hand: No acute fractures: No subluxations dislocations throughout the hand particularly thumb.  No significant arthropathy.  Procedures: Visit Diagnoses:  1. Pain in left hand   2. Trigger thumb, left thumb     Hand/UE Inj: L thumb A1 for trigger finger on 12/29/2023 12:21 PM Details: 25 G needle, volar approach Medications: 0.5 mL lidocaine 1 %; 20 mg methylPREDNISolone acetate 40 MG/ML Consent was given by the patient. Immediately prior to procedure a time out was called to verify the correct patient, procedure, equipment, support staff and site/side marked as required. Patient was prepped and draped in the usual sterile fashion.      Plan: She can stop wearing the thumb spica splint.  Activities as tolerated.  Follow-up as needed pain persist becomes worse.  She knows to wait least 3 months between injections.

## 2024-01-04 ENCOUNTER — Other Ambulatory Visit: Payer: Self-pay | Admitting: Family

## 2024-01-04 ENCOUNTER — Ambulatory Visit
Admission: RE | Admit: 2024-01-04 | Discharge: 2024-01-04 | Disposition: A | Discharge status: Home / Self Care | Source: Ambulatory Visit | Attending: Family

## 2024-01-04 ENCOUNTER — Ambulatory Visit
Admission: RE | Admit: 2024-01-04 | Discharge: 2024-01-04 | Disposition: A | Discharge status: Home / Self Care | Source: Ambulatory Visit | Attending: Family | Admitting: Family

## 2024-01-04 DIAGNOSIS — F17210 Nicotine dependence, cigarettes, uncomplicated: Secondary | ICD-10-CM

## 2024-01-04 DIAGNOSIS — R590 Localized enlarged lymph nodes: Secondary | ICD-10-CM | POA: Insufficient documentation

## 2024-01-04 DIAGNOSIS — N6012 Diffuse cystic mastopathy of left breast: Secondary | ICD-10-CM

## 2024-01-04 DIAGNOSIS — R030 Elevated blood-pressure reading, without diagnosis of hypertension: Secondary | ICD-10-CM

## 2024-01-04 DIAGNOSIS — N1831 Chronic kidney disease, stage 3a: Secondary | ICD-10-CM

## 2024-01-04 DIAGNOSIS — Z720 Tobacco use: Secondary | ICD-10-CM

## 2024-01-04 DIAGNOSIS — M79645 Pain in left finger(s): Secondary | ICD-10-CM

## 2024-01-04 DIAGNOSIS — I700 Atherosclerosis of aorta: Secondary | ICD-10-CM

## 2024-01-04 DIAGNOSIS — N644 Mastodynia: Secondary | ICD-10-CM

## 2024-01-04 DIAGNOSIS — E7800 Pure hypercholesterolemia, unspecified: Secondary | ICD-10-CM

## 2024-01-04 DIAGNOSIS — E538 Deficiency of other specified B group vitamins: Secondary | ICD-10-CM

## 2024-01-04 DIAGNOSIS — R92322 Mammographic fibroglandular density, left breast: Secondary | ICD-10-CM | POA: Diagnosis not present

## 2024-01-04 DIAGNOSIS — G8929 Other chronic pain: Secondary | ICD-10-CM

## 2024-01-04 DIAGNOSIS — F32A Depression, unspecified: Secondary | ICD-10-CM

## 2024-01-04 NOTE — Progress Notes (Signed)
 noted

## 2024-01-05 ENCOUNTER — Encounter: Payer: Self-pay | Admitting: Family

## 2024-01-10 ENCOUNTER — Telehealth: Payer: Self-pay | Admitting: Family

## 2024-01-10 NOTE — Telephone Encounter (Signed)
 Spoke with pt. Advsied her that we do not have any forms from her oral surgeon. She states that she spoke with them this morning and they are going to be refaxing the forms to [place]. If I do not receive these forms I will give their office a call, Dr. Leretha Dykes.

## 2024-01-10 NOTE — Telephone Encounter (Signed)
 Haven't seen anything yet for this, so just an FYI.  Copied from CRM [id number]. Topic: General - Other >> Jan 10, 2024  9:07 AM Elmarie Shiley S wrote: Reason for CRM: Patient is needing form filled out by NP Mort Sawyers to be cleared to get dental procedure patient stated the surgeon faxed forms on 12/24/23 Please follow up with the patient.

## 2024-01-11 NOTE — Telephone Encounter (Addendum)
 As of this morning a fax has not been received. I called Dr. Letta Moynahan office at [phone] and left a message to have this fax resent.

## 2024-01-12 NOTE — Telephone Encounter (Signed)
 Anything in regards to clearance requires a pre op clearance visit.

## 2024-01-12 NOTE — Telephone Encounter (Signed)
 Copied from CRM [id number]. Topic: General - Other >> Jan 12, 2024 11:30 AM Kathryne Eriksson wrote: Reason for CRM: Medical Clearance >> Jan 12, 2024 11:32 AM Kathryne Eriksson wrote: [facility]  [place] [phone]  Called on behalf of patient, wanting to confirm the medical clearance that has been faxed over numerous times had been received.

## 2024-01-12 NOTE — Telephone Encounter (Signed)
 As of this morning, fax has still not been received. Called pt and made her aware of this. She gave me another number to contact [phone]. Called and asked for the fax to be resent.

## 2024-01-12 NOTE — Telephone Encounter (Addendum)
Fax has been received and placed in [name] [name]'s in box.

## 2024-01-12 NOTE — Telephone Encounter (Signed)
 Copied from CRM [id number]. Topic: General - Other >> Jan 12, 2024 11:30 AM Kathryne Eriksson wrote: Reason for CRM: Medical Clearance

## 2024-01-12 NOTE — Telephone Encounter (Signed)
 Spoke with pt and she has been scheduled to see [name] [name] on 01/14/24 at 1000.

## 2024-01-12 NOTE — Telephone Encounter (Signed)
 NOTED

## 2024-01-14 ENCOUNTER — Ambulatory Visit (INDEPENDENT_AMBULATORY_CARE_PROVIDER_SITE_OTHER)
Admission: RE | Admit: 2024-01-14 | Discharge: 2024-01-14 | Disposition: A | Discharge status: Home / Self Care | Source: Ambulatory Visit | Attending: Family | Admitting: Family

## 2024-01-14 ENCOUNTER — Encounter: Payer: Self-pay | Admitting: Family

## 2024-01-14 ENCOUNTER — Ambulatory Visit: Admitting: Family

## 2024-01-14 VITALS — BP 134/86 | HR 82 | Temp 98.2°F | Ht 59.5 in | Wt 111.4 lb

## 2024-01-14 DIAGNOSIS — D582 Other hemoglobinopathies: Secondary | ICD-10-CM | POA: Diagnosis not present

## 2024-01-14 DIAGNOSIS — Z720 Tobacco use: Secondary | ICD-10-CM

## 2024-01-14 DIAGNOSIS — N1831 Chronic kidney disease, stage 3a: Secondary | ICD-10-CM

## 2024-01-14 DIAGNOSIS — J449 Chronic obstructive pulmonary disease, unspecified: Secondary | ICD-10-CM | POA: Diagnosis not present

## 2024-01-14 DIAGNOSIS — D696 Thrombocytopenia, unspecified: Secondary | ICD-10-CM

## 2024-01-14 DIAGNOSIS — R011 Cardiac murmur, unspecified: Secondary | ICD-10-CM

## 2024-01-14 DIAGNOSIS — Z01818 Encounter for other preprocedural examination: Secondary | ICD-10-CM

## 2024-01-14 DIAGNOSIS — E7800 Pure hypercholesterolemia, unspecified: Secondary | ICD-10-CM

## 2024-01-14 LAB — COMPREHENSIVE METABOLIC PANEL WITH GFR
ALT: 17 U/L (ref 0–35)
AST: 16 U/L (ref 0–37)
Albumin: 4.8 g/dL (ref 3.5–5.2)
Alkaline Phosphatase: 62 U/L (ref 39–117)
BUN: 9 mg/dL (ref 6–23)
CO2: 31 meq/L (ref 19–32)
Calcium: 9.7 mg/dL (ref 8.4–10.5)
Chloride: 104 meq/L (ref 96–112)
Creatinine, Ser: 0.97 mg/dL (ref 0.40–1.20)
GFR: 62.6 mL/min (ref >60.00)
Glucose, Bld: 90 mg/dL (ref 70–99)
Potassium: 4.3 meq/L (ref 3.5–5.1)
Sodium: 142 meq/L (ref 135–145)
Total Bilirubin: 0.6 mg/dL (ref 0.2–1.2)
Total Protein: 7.1 g/dL (ref 6.0–8.3)

## 2024-01-14 LAB — URINALYSIS, ROUTINE W REFLEX MICROSCOPIC
Bilirubin Urine: NEGATIVE
Hgb urine dipstick: NEGATIVE
Ketones, ur: NEGATIVE
Nitrite: NEGATIVE
Specific Gravity, Urine: 1.01 (ref 1.000–1.030)
Total Protein, Urine: NEGATIVE
Urine Glucose: NEGATIVE
Urobilinogen, UA: 0.2 (ref 0.0–1.0)
pH: 6.5 (ref 5.0–8.0)

## 2024-01-14 LAB — CBC
HCT: 48.1 % — ABNORMAL HIGH (ref 36.0–46.0)
Hemoglobin: 16.1 g/dL — ABNORMAL HIGH (ref 12.0–15.0)
MCHC: 33.4 g/dL (ref 30.0–36.0)
MCV: 91.7 fl (ref 78.0–100.0)
Platelets: 147 10*3/uL — ABNORMAL LOW (ref 150.0–400.0)
RBC: 5.25 Mil/uL — ABNORMAL HIGH (ref 3.87–5.11)
RDW: 13.5 % (ref 11.5–15.5)
WBC: 5.6 10*3/uL (ref 4.0–10.5)

## 2024-01-14 LAB — PROTIME-INR
INR: 1.1 ratio — ABNORMAL HIGH (ref 0.8–1.0)
Prothrombin Time: 11.2 s (ref 9.6–13.1)

## 2024-01-14 NOTE — Progress Notes (Unsigned)
 Assessment & Plan:  Pre-op examination Assessment & Plan: Pending results CXR  EKG reviewed in office, stable from prior, NSR Labs ordered pending results   Orders: -     Protime-INR -     Comprehensive metabolic panel with GFR -     CBC -     DG Chest 2 View; Future -     EKG 12-Lead -     Urinalysis, Routine w reflex microscopic; Future  Chronic obstructive pulmonary disease, unspecified COPD type (HCC) Assessment & Plan: Stable Cxr ordered today for clearance pending results  Orders: -     DG Chest 2 View; Future  Stage 3a chronic kidney disease (HCC)  Elevated hemoglobin (HCC)  Tobacco use Assessment & Plan: Down to 4 cigarettes ok Did d/w pt this will increase risk infection Pt aware Will leave at surgeons discretion  Orders: -     DG Chest 2 View; Future  Low platelet count (HCC) -     CBC  ADDENDUM 01/19/24: pt cleared for surgery from a primary care perspective.   I have independently evaluated patient.  [name] [name] is a 63 y.o. female who is above average risk for a low risk surgery.  There are modifiable risk factors (smoking) Amarii P Brozek's RCRI/NSQIP calculation for MACE is: 2 points, low score.    Review meds: If indicated, NO ACE-I/ARB day of surgery. OK to do BB or statin day of (esp if vascular sx)  Return if symptoms worsen or fail to improve.  Mort Sawyers, MSN, APRN, FNP-C [facility]  Subjective:      HPI: Pt is a 63 y.o. female who is here for preoperative clearance for exraction teeth # 20 and 22 with general anesthesia   1) High Risk Cardiac Conditions:  1) Recent MI - No.  2) Decompensated Heart Failure - No.  3) Unstable angina - No.  4) Symptomatic arrythmia - No.  5) Sx Valvular Disease - No.  2) Intermediate Risk Factors: DM, CKD, CVA, CHF, CAD - Yes.   CKD stage 3a, aortic atherosclerosis  2) Functional Status: > 4 mets (Walk, run, climb stairs) Yes.  Kateri Mc Activity Status Index:  6.42 mets  3) Surgery Specific Risk: High  (Emergency, Vascular, Intra-abdominal, Extensive ops)                 Low (Endoscopic, Cataract, Breast )  4) Further Noninvasive evaluation:   1) EKG - Yes.     Hx of MI, CVA, CAD, DM, CKD  2) Echo - Yes.     Worsening dyspnea or CHF without an echo in the past year  3) Stress Testing - Active Cardiac Disease - No.  4) CXR Yes.     If asymptomatic, healthy, no respiratory symptoms it is not needed  5)PFTs No.   OSA, OHS, or significant cardiopulmonary history  5) Need for medical therapy - Beta Blocker, Statins indicated ? No.  New complaints: N/a  Social history:  Relevant past medical, surgical, family and social history reviewed and updated as indicated. Interim medical history since our last visit reviewed.  Allergies and medications reviewed and updated.  DATA REVIEWED: CHART IN EPIC  ROS: Negative unless specifically indicated above in HPI.    Current Outpatient Medications:  .  albuterol (VENTOLIN HFA) 108 (90 Base) MCG/ACT inhaler, Inhale 1-2 puffs into the lungs every 6 (six) hours as needed for wheezing or shortness of breath., Disp: 8 g, Rfl: 3 .  atorvastatin (LIPITOR) 20 MG tablet, Take 1 tablet (20 mg total) by mouth daily., Disp: 90 tablet, Rfl: 3 .  buPROPion (WELLBUTRIN SR) 150 MG 12 hr tablet, Take 1 tablet (150 mg total) by mouth 2 (two) times daily., Disp: 180 tablet, Rfl: 0 .  Calcium-Vitamin D 600-5 MG-MCG TABS, Take 1 tablet by mouth daily. (Patient taking differently: Take 2 tablets by mouth daily.), Disp: , Rfl:  .  fexofenadine (ALLEGRA) 180 MG tablet, Take 180 mg by mouth daily., Disp: , Rfl:  .  loratadine (CLARITIN) 10 MG tablet, Take 10 mg by mouth daily as needed for allergies., Disp: , Rfl:  .  meloxicam (MOBIC) 7.5 MG tablet, Take 1 tablet (7.5 mg total) by mouth daily., Disp: 30 tablet, Rfl: 0 .  tiZANidine (ZANAFLEX) 4 MG tablet, TAKE 1/2 TABLET AT BEDTIME AS NEEDED FOR MUSCLE RELAXER, Disp: 45  tablet, Rfl: 1 .  triamcinolone cream (KENALOG) 0.1 %, Apply 1 Application topically 2 (two) times daily., Disp: 30 g, Rfl: 0      Objective:    BP 134/86 (BP Location: Left Arm, Patient Position: Sitting, Cuff Size: Normal)   Pulse 82   Temp 98.2 F (36.8 C) (Temporal)   Ht 4' 11.5" (1.511 m)   Wt 111 lb 6.4 oz (50.5 kg)   SpO2 99%   BMI 22.12 kg/m   Wt Readings from Last 3 Encounters:  01/14/24 111 lb 6.4 oz (50.5 kg)  12/27/23 112 lb (50.8 kg)  08/09/23 112 lb (50.8 kg)    Physical Exam Constitutional:      General: She is not in acute distress.    Appearance: Normal appearance. She is normal weight. She is not ill-appearing, toxic-appearing or diaphoretic.  HENT:     Head: Normocephalic.     Mouth/Throat:     Mouth: Mucous membranes are moist.     Pharynx: Oropharynx is clear.  Cardiovascular:     Rate and Rhythm: Normal rate and regular rhythm.  Pulmonary:     Effort: Pulmonary effort is normal.     Breath sounds: Normal breath sounds.  Musculoskeletal:        General: Normal range of motion.     Right lower leg: No edema.     Left lower leg: No edema.  Neurological:     General: No focal deficit present.     Mental Status: She is alert and oriented to person, place, and time. Mental status is at baseline.  Psychiatric:        Mood and Affect: Mood normal.        Behavior: Behavior normal.        Thought Content: Thought content normal.        Judgment: Judgment normal.  No murmur appreciated on todays exam

## 2024-01-17 ENCOUNTER — Telehealth: Payer: Self-pay | Admitting: Family

## 2024-01-17 ENCOUNTER — Encounter: Payer: Self-pay | Admitting: Family

## 2024-01-17 NOTE — Telephone Encounter (Signed)
 When I left I was pending the CXR looks like it resulted today.  Will sign tomorrow to send in.

## 2024-01-17 NOTE — Telephone Encounter (Signed)
 Copied from CRM [id number]. Topic: General - Other >> Jan 17, 2024 11:35 AM Elizebeth Brooking wrote: Reason for CRM: [facility] called in regarding clearance form, wanted to know the status and if is completed to send back signed and including medical history recent labs and office notes and list of medications

## 2024-01-18 ENCOUNTER — Encounter: Payer: Self-pay | Admitting: Family

## 2024-01-18 DIAGNOSIS — Z01818 Encounter for other preprocedural examination: Secondary | ICD-10-CM | POA: Insufficient documentation

## 2024-01-18 NOTE — Assessment & Plan Note (Signed)
 Stable Cxr ordered today for clearance pending results

## 2024-01-18 NOTE — Assessment & Plan Note (Signed)
 Pending results CXR  EKG reviewed in office, stable from prior, NSR Labs ordered pending results

## 2024-01-18 NOTE — Assessment & Plan Note (Signed)
 Down to 4 cigarettes ok Did d/w pt this will increase risk infection Pt aware Will leave at surgeons discretion

## 2024-01-19 NOTE — Telephone Encounter (Signed)
 Forms have completed but will need her OV note from 01/14/24 to be completed so that I may fax it along with the release.

## 2024-01-20 NOTE — Telephone Encounter (Signed)
 Clearance form, OV note and medication list have been faxed to [facility].

## 2024-01-20 NOTE — Telephone Encounter (Signed)
 completed

## 2024-01-21 ENCOUNTER — Encounter: Payer: Self-pay | Admitting: Family

## 2024-01-24 ENCOUNTER — Telehealth: Payer: Self-pay | Admitting: Family

## 2024-01-24 NOTE — Telephone Encounter (Signed)
 Copied from CRM [id number]. Topic: General - Other >> Jan 24, 2024  2:12 PM Freya Jesus wrote: Reason for CRM: Edwina Gram from [facility] called regarding patient surgical clearance form is missing lab values and platelet count. Requesting form is refaxed with missing information. Fax: [phone].

## 2024-01-25 ENCOUNTER — Other Ambulatory Visit: Payer: Self-pay | Admitting: Family

## 2024-01-25 DIAGNOSIS — M79645 Pain in left finger(s): Secondary | ICD-10-CM

## 2024-01-26 NOTE — Telephone Encounter (Signed)
 Faxed back to [place] @ Fax: [phone]. Received confirmation that they received it on 01/26/24

## 2024-03-06 ENCOUNTER — Telehealth: Payer: Self-pay | Admitting: Family

## 2024-03-06 ENCOUNTER — Other Ambulatory Visit: Payer: Self-pay | Admitting: Family

## 2024-03-06 DIAGNOSIS — R590 Localized enlarged lymph nodes: Secondary | ICD-10-CM

## 2024-03-06 NOTE — Telephone Encounter (Signed)
-----   Message from [place] sent at 01/04/2024  2:32 PM EDT ----- Needs repeat three month close f/u u/s due 6/25

## 2024-03-18 ENCOUNTER — Other Ambulatory Visit: Payer: Self-pay | Admitting: Family

## 2024-03-18 DIAGNOSIS — E7800 Pure hypercholesterolemia, unspecified: Secondary | ICD-10-CM

## 2024-03-25 ENCOUNTER — Other Ambulatory Visit: Payer: Self-pay | Admitting: Family

## 2024-03-25 DIAGNOSIS — F419 Anxiety disorder, unspecified: Secondary | ICD-10-CM

## 2024-03-25 DIAGNOSIS — F17210 Nicotine dependence, cigarettes, uncomplicated: Secondary | ICD-10-CM

## 2024-08-14 ENCOUNTER — Encounter: Payer: Self-pay | Admitting: Radiology

## 2024-10-30 ENCOUNTER — Ambulatory Visit

## 2024-11-16 ENCOUNTER — Ambulatory Visit

## 2024-11-16 VITALS — Ht 59.0 in | Wt 115.0 lb

## 2024-11-16 DIAGNOSIS — Z0000 Encounter for general adult medical examination without abnormal findings: Secondary | ICD-10-CM

## 2024-11-16 NOTE — Patient Instructions (Signed)
 [name] [name],  Thank you for taking the time for your Medicare Wellness Visit. I appreciate your continued commitment to your health goals. Please review the care plan we discussed, and feel free to reach out if I can assist you further.  Please note that Annual Wellness Visits do not include a physical exam. Some assessments may be limited, especially if the visit was conducted virtually. If needed, we may recommend an in-person follow-up with your provider.  Ongoing Care Seeing your primary care provider every 3 to 6 months helps us  monitor your health and provide consistent, personalized care.   Referrals If a referral was made during today's visit and you haven't received any updates within two weeks, please contact the referred provider directly to check on the status.  Recommended Screenings:  Health Maintenance  Topic Date Due   COVID-19 Vaccine (1) Never done   HIV Screening  Never done   Zoster (Shingles) Vaccine (1 of 2) Never done   Flu Shot  05/12/2024   Medicare Annual Wellness Visit  08/08/2024   Screening for Lung Cancer  11/28/2024   Breast Cancer Screening  03/22/2025   Cologuard (Stool DNA test)  01/12/2026   Pneumococcal Vaccine for age over 58  Completed   HPV Vaccine (No Doses Required) Completed   Hepatitis C Screening  Completed   Hepatitis B Vaccine  Aged Out   Meningitis B Vaccine  Aged Out   DTaP/Tdap/Td vaccine  Discontinued       11/16/2024    8:51 AM  Advanced Directives  Does Patient Have a Medical Advance Directive? No  Would patient like information on creating a medical advance directive? No - Patient declined    Vision: Annual vision screenings are recommended for early detection of glaucoma, cataracts, and diabetic retinopathy. These exams can also reveal signs of chronic conditions such as diabetes and high blood pressure.  Dental: Annual dental screenings help detect early signs of oral cancer, gum disease, and other conditions linked to  overall health, including heart disease and diabetes.  Please see the attached documents for additional preventive care recommendations.

## 2024-11-16 NOTE — Progress Notes (Signed)
 "  Chief Complaint  Patient presents with   Medicare Wellness     Subjective:   [name] [name] is a 64 y.o. female who presents for a Medicare Annual Wellness Visit.  Visit info / Clinical Intake: Medicare Wellness Visit Type:: Subsequent Annual Wellness Visit Persons participating in visit and providing information:: patient Medicare Wellness Visit Mode:: Video Since this visit was completed virtually, some vitals may be partially provided or unavailable. Missing vitals are due to the limitations of the virtual format.: Documented vitals are patient reported If Telephone or Video please confirm:: I connected with patient using audio/video enable telemedicine. I verified patient identity with two identifiers, discussed telehealth limitations, and patient agreed to proceed. Patient Location:: home Provider Location:: office Interpreter Needed?: No Pre-visit prep was completed: yes AWV questionnaire completed by patient prior to visit?: no Living arrangements:: lives with spouse/significant other Patient's Overall Health Status Rating: good Typical amount of pain: (!) a lot Does pain affect daily life?: no Are you currently prescribed opioids?: no  Dietary Habits and Nutritional Risks How many meals a day?: 3 Eats fruit and vegetables daily?: (!) no (more vegetables) Most meals are obtained by: preparing own meals; eating out In the last 2 weeks, have you had any of the following?: none Diabetic:: no  Functional Status Activities of Daily Living (to include ambulation/medication): Independent Ambulation: Independent Medication Administration: Independent Home Management (perform basic housework or laundry): Independent Manage your own finances?: yes Primary transportation is: driving Concerns about vision?: (!) yes (blurriness at times) Concerns about hearing?: (!) yes (says had hearing checked and they said it was ok) Uses hearing aids?: no  Fall Screening Falls in the  past year?: 0 Number of falls in past year: 0 Was there an injury with Fall?: 0 Fall Risk Category Calculator: 0 Patient Fall Risk Level: Low Fall Risk  Fall Risk Patient at Risk for Falls Due to: Medication side effect Fall risk Follow up: Falls prevention discussed; Education provided; Falls evaluation completed  Home and Transportation Safety: All rugs have non-skid backing?: (!) no All stairs or steps have railings?: yes Grab bars in the bathtub or shower?: (!) no Have non-skid surface in bathtub or shower?: yes Good home lighting?: yes Regular seat belt use?: yes Hospital stays in the last year:: no  Cognitive Assessment Difficulty concentrating, remembering, or making decisions? : no Will 6CIT or Mini Cog be Completed: yes What year is it?: 0 points What month is it?: 0 points Give patient an address phrase to remember (5 components): [address] [place] About what time is it?: 3 points Count backwards from 20 to 1: 0 points Say the months of the year in reverse: 0 points Repeat the address phrase from earlier: 0 points 6 CIT Score: 3 points  Advance Directives (For Healthcare) Does Patient Have a Medical Advance Directive?: No Would patient like information on creating a medical advance directive?: No - Patient declined  Reviewed/Updated  Reviewed/Updated: Reviewed All (Medical, Surgical, Family, Medications, Allergies, Care Teams, Patient Goals)    Allergies (verified) Codeine   Current Medications (verified) Outpatient Encounter Medications as of 11/16/2024  Medication Sig   albuterol  (VENTOLIN  HFA) 108 (90 Base) MCG/ACT inhaler Inhale 1-2 puffs into the lungs every 6 (six) hours as needed for wheezing or shortness of breath.   buPROPion  (WELLBUTRIN  SR) 150 MG 12 hr tablet TAKE 1 TABLET BY MOUTH TWICE A DAY   Calcium -Vitamin D  600-5 MG-MCG TABS Take 1 tablet by mouth daily. (Patient taking  differently: Take 2 tablets by mouth daily.)   cyanocobalamin   (VITAMIN B12) 1000 MCG tablet Take 1,000 mcg by mouth daily.   fexofenadine (ALLEGRA) 180 MG tablet Take 180 mg by mouth daily.   loratadine  (CLARITIN ) 10 MG tablet Take 10 mg by mouth daily as needed for allergies.   meloxicam  (MOBIC ) 7.5 MG tablet TAKE 1 TABLET BY MOUTH EVERY DAY   rosuvastatin  (CRESTOR ) 20 MG tablet TAKE 1 TABLET BY MOUTH EVERY DAY   tiZANidine  (ZANAFLEX ) 4 MG tablet TAKE 1/2 TABLET AT BEDTIME AS NEEDED FOR MUSCLE RELAXER   triamcinolone  cream (KENALOG ) 0.1 % Apply 1 Application topically 2 (two) times daily.   atorvastatin  (LIPITOR) 20 MG tablet Take 1 tablet (20 mg total) by mouth daily.   No facility-administered encounter medications on file as of 11/16/2024.    History: Past Medical History:  Diagnosis Date   Anxiety    COPD (chronic obstructive pulmonary disease) (HCC)    Depression    Fibroids    Uterine   HLD (hyperlipidemia)    Major depressive disorder, recurrent 08/27/2014   OA (osteoarthritis)    Tobacco abuse    Vitiligo 1975   Wears glasses    Past Surgical History:  Procedure Laterality Date   DILATION AND CURETTAGE OF UTERUS     HIP ARTHROSCOPY  10/12/2001   right    LAPAROSCOPIC TOTAL HYSTERECTOMY  10/12/2009   MANDIBLE SURGERY  10/12/1978   TOTAL HIP ARTHROPLASTY Right 12/27/2015   Procedure: RIGHT TOTAL HIP ARTHROPLASTY ANTERIOR APPROACH;  Surgeon: Lonni CINDERELLA Poli, MD;  Location: [facility];  Service: Orthopedics;  Laterality: Right;   TUBAL LIGATION     URETER SURGERY  10/12/2009   Family History  Problem Relation Age of Onset   Diabetes Father    Lung cancer Father        smoker   Miscarriages / Stillbirths Sister    Lung cancer Sister        smoker   Depression Maternal Grandmother    Lung cancer Niece        non smoker, lung liver kidney   Breast cancer Neg Hx    Social History   Occupational History   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[employer]  Tobacco Use   Smoking status: Every Day    Current packs/day: 1.00    Average  packs/day: 1 pack/day for 50.0 years (50.0 ttl pk-yrs)    Types: Cigarettes   Smokeless tobacco: Never   Tobacco comments:    recently quit 04/2018  Vaping Use   Vaping status: Never Used  Substance and Sexual Activity   Alcohol use: No    Comment: history of alcoholism quit 16 years ago    Drug use: Yes    Types: Marijuana    Comment: Marijuana (uses daily)   Sexual activity: Yes    Partners: Male    Birth control/protection: Surgical   Tobacco Counseling Ready to quit: Not Answered Counseling given: Not Answered Tobacco comments: recently quit 04/2018  SDOH Screenings   Food Insecurity: No Food Insecurity (11/16/2024)  Housing: Low Risk (11/16/2024)  Transportation Needs: No Transportation Needs (11/16/2024)  Utilities: Not At Risk (11/16/2024)  Alcohol Screen: Low Risk (11/16/2024)  Depression (PHQ2-9): Low Risk (11/16/2024)  Financial Resource Strain: Low Risk (11/16/2024)  Physical Activity: Inactive (11/16/2024)  Social Connections: Moderately Isolated (11/16/2024)  Stress: Stress Concern Present (11/16/2024)  Tobacco Use: High Risk (11/16/2024)  Health Literacy: Adequate Health Literacy (11/16/2024)   See flowsheets for full screening details  Depression Screen PHQ  2 & 9 Depression Scale- Over the past 2 weeks, how often have you been bothered by any of the following problems? Little interest or pleasure in doing things: 1 Feeling down, depressed, or hopeless (PHQ Adolescent also includes...irritable): 1 PHQ-2 Total Score: 2 Trouble falling or staying asleep, or sleeping too much: 0 Feeling tired or having little energy: 1 Poor appetite or overeating (PHQ Adolescent also includes...weight loss): 0 Feeling bad about yourself - or that you are a failure or have let yourself or your family down: 0 Trouble concentrating on things, such as reading the newspaper or watching television (PHQ Adolescent also includes...like school work): 0 Moving or speaking so slowly that other people could  have noticed. Or the opposite - being so fidgety or restless that you have been moving around a lot more than usual: 0 Thoughts that you would be better off dead, or of hurting yourself in some way: 0 PHQ-9 Total Score: 3 If you checked off any problems, how difficult have these problems made it for you to do your work, take care of things at home, or get along with other people?: Not difficult at all  Depression Treatment Depression Interventions/Treatment : [id number] Score <4 Follow-up Not Indicated     Goals Addressed               This Visit's Progress     denies goals (pt-stated)               Objective:    Today's Vitals   11/16/24 0844  Weight: 115 lb (52.2 kg)  Height: 4' 11 (1.499 m)   Body mass index is 23.23 kg/m.  Hearing/Vision screen Vision Screening - Comments:: Regular eye exams Immunizations and Health Maintenance Health Maintenance  Topic Date Due   COVID-19 Vaccine (1) Never done   HIV Screening  Never done   Zoster Vaccines- Shingrix (1 of 2) Never done   Influenza Vaccine  05/12/2024   Lung Cancer Screening  11/28/2024   Mammogram  03/22/2025   Medicare Annual Wellness (AWV)  11/16/2025   Fecal DNA (Cologuard)  01/12/2026   Pneumococcal Vaccine: 50+ Years  Completed   HPV VACCINES (No Doses Required) Completed   Hepatitis C Screening  Completed   Hepatitis B Vaccines 19-59 Average Risk  Aged Out   Meningococcal B Vaccine  Aged Out   DTaP/Tdap/Td  Discontinued        Assessment/Plan:  This is a routine wellness examination for [name] [name].  Patient Care Team: Corwin Antu, FNP as PCP - General (Family Medicine) Laurice Francis NOVAK, OD [facility])  I have personally reviewed and noted the following in the patients chart:   Medical and social history Use of alcohol, tobacco or illicit drugs  Current medications and supplements including opioid prescriptions. Functional ability and status Nutritional status Physical activity Advanced  directives List of other physicians Hospitalizations, surgeries, and ER visits in previous 12 months Vitals Screenings to include cognitive, depression, and falls Referrals and appointments  No orders of the defined types were placed in this encounter.  In addition, I have reviewed and discussed with patient certain preventive protocols, quality metrics, and best practice recommendations. A written personalized care plan for preventive services as well as general preventive health recommendations were provided to patient.   Ardella FORBES Dawn, LPN   04/14/7972   Return in 1 year (on 11/16/2025).  After Visit Summary: (MyChart) Due to this being a telephonic visit, the after visit summary with patients personalized plan was  offered to patient via MyChart   Nurse Notes: Vaccines not given: flu, covid and shingles declined today HM Addressed: Patient states she needs something for her nerves. She has been having a lot of stress since her husband has been diagnosed with leukemia. A message has been sent to [facility] with this request.  "

## 2024-12-21 ENCOUNTER — Other Ambulatory Visit

## 2024-12-28 ENCOUNTER — Encounter: Admitting: Family
